# Patient Record
Sex: Female | Born: 1985 | Race: Black or African American | Hispanic: No | Marital: Married | Smoking: Former smoker
Health system: Southern US, Community
[De-identification: ages and names within clinical notes are randomized; demographics above are authoritative.]

## PROBLEM LIST (undated history)

## (undated) ENCOUNTER — Emergency Department (HOSPITAL_BASED_OUTPATIENT_CLINIC_OR_DEPARTMENT_OTHER): Admission: EM | Payer: BLUE CROSS/BLUE SHIELD

## (undated) ENCOUNTER — Emergency Department (HOSPITAL_BASED_OUTPATIENT_CLINIC_OR_DEPARTMENT_OTHER): Admission: EM | Payer: BLUE CROSS/BLUE SHIELD | Source: Home / Self Care

## (undated) DIAGNOSIS — G43909 Migraine, unspecified, not intractable, without status migrainosus: Secondary | ICD-10-CM

## (undated) DIAGNOSIS — K319 Disease of stomach and duodenum, unspecified: Secondary | ICD-10-CM

## (undated) DIAGNOSIS — F419 Anxiety disorder, unspecified: Secondary | ICD-10-CM

## (undated) DIAGNOSIS — IMO0002 Reserved for concepts with insufficient information to code with codable children: Secondary | ICD-10-CM

## (undated) DIAGNOSIS — M329 Systemic lupus erythematosus, unspecified: Secondary | ICD-10-CM

## (undated) DIAGNOSIS — N83209 Unspecified ovarian cyst, unspecified side: Secondary | ICD-10-CM

## (undated) DIAGNOSIS — E079 Disorder of thyroid, unspecified: Secondary | ICD-10-CM

## (undated) HISTORY — PX: TONSILLECTOMY: SUR1361

## (undated) HISTORY — PX: OVARIAN CYST REMOVAL: SHX89

---

## 2012-08-22 DIAGNOSIS — D649 Anemia, unspecified: Secondary | ICD-10-CM | POA: Diagnosis present

## 2012-08-22 DIAGNOSIS — D709 Neutropenia, unspecified: Secondary | ICD-10-CM | POA: Insufficient documentation

## 2012-11-06 DIAGNOSIS — E0591 Thyrotoxicosis, unspecified with thyrotoxic crisis or storm: Secondary | ICD-10-CM | POA: Insufficient documentation

## 2012-11-06 DIAGNOSIS — E0590 Thyrotoxicosis, unspecified without thyrotoxic crisis or storm: Secondary | ICD-10-CM | POA: Insufficient documentation

## 2013-10-13 DIAGNOSIS — D72819 Decreased white blood cell count, unspecified: Secondary | ICD-10-CM | POA: Diagnosis present

## 2013-10-13 DIAGNOSIS — D509 Iron deficiency anemia, unspecified: Secondary | ICD-10-CM | POA: Insufficient documentation

## 2014-08-21 DIAGNOSIS — M329 Systemic lupus erythematosus, unspecified: Secondary | ICD-10-CM | POA: Diagnosis present

## 2015-02-20 DIAGNOSIS — G43009 Migraine without aura, not intractable, without status migrainosus: Secondary | ICD-10-CM | POA: Insufficient documentation

## 2015-08-22 ENCOUNTER — Emergency Department (HOSPITAL_COMMUNITY)
Admission: EM | Admit: 2015-08-22 | Discharge: 2015-08-22 | Disposition: A | Discharge status: Home / Self Care | Payer: Self-pay | Attending: Emergency Medicine | Admitting: Emergency Medicine

## 2015-08-22 ENCOUNTER — Encounter (HOSPITAL_COMMUNITY): Payer: Self-pay

## 2015-08-22 ENCOUNTER — Emergency Department (HOSPITAL_COMMUNITY): Payer: Self-pay

## 2015-08-22 DIAGNOSIS — R0602 Shortness of breath: Secondary | ICD-10-CM | POA: Insufficient documentation

## 2015-08-22 DIAGNOSIS — Z8742 Personal history of other diseases of the female genital tract: Secondary | ICD-10-CM | POA: Insufficient documentation

## 2015-08-22 DIAGNOSIS — F410 Panic disorder [episodic paroxysmal anxiety] without agoraphobia: Secondary | ICD-10-CM | POA: Insufficient documentation

## 2015-08-22 DIAGNOSIS — R05 Cough: Secondary | ICD-10-CM | POA: Insufficient documentation

## 2015-08-22 DIAGNOSIS — R000 Tachycardia, unspecified: Secondary | ICD-10-CM | POA: Insufficient documentation

## 2015-08-22 DIAGNOSIS — Z8679 Personal history of other diseases of the circulatory system: Secondary | ICD-10-CM | POA: Insufficient documentation

## 2015-08-22 DIAGNOSIS — Z8739 Personal history of other diseases of the musculoskeletal system and connective tissue: Secondary | ICD-10-CM | POA: Insufficient documentation

## 2015-08-22 HISTORY — DX: Systemic lupus erythematosus, unspecified: M32.9

## 2015-08-22 HISTORY — DX: Migraine, unspecified, not intractable, without status migrainosus: G43.909

## 2015-08-22 HISTORY — DX: Unspecified ovarian cyst, unspecified side: N83.209

## 2015-08-22 HISTORY — DX: Reserved for concepts with insufficient information to code with codable children: IMO0002

## 2015-08-22 HISTORY — DX: Anxiety disorder, unspecified: F41.9

## 2015-08-22 LAB — D-DIMER, QUANTITATIVE (NOT AT ARMC)

## 2015-08-22 LAB — BASIC METABOLIC PANEL
Anion gap: 11 (ref 5–15)
BUN: 6 mg/dL (ref 6–20)
CALCIUM: 9.6 mg/dL (ref 8.9–10.3)
CO2: 22 mmol/L (ref 22–32)
CREATININE: 0.51 mg/dL (ref 0.44–1.00)
Chloride: 105 mmol/L (ref 101–111)
GFR calc Af Amer: >60 mL/min (ref >60)
GLUCOSE: 121 mg/dL — AB (ref 65–99)
Potassium: 3.1 mmol/L — ABNORMAL LOW (ref 3.5–5.1)
Sodium: 138 mmol/L (ref 135–145)

## 2015-08-22 LAB — CBC WITH DIFFERENTIAL/PLATELET
BASOS ABS: 0 10*3/uL (ref 0.0–0.1)
BASOS PCT: 1 %
EOS ABS: 0.1 10*3/uL (ref 0.0–0.7)
Eosinophils Relative: 2 %
HCT: 32.6 % — ABNORMAL LOW (ref 36.0–46.0)
Hemoglobin: 10.7 g/dL — ABNORMAL LOW (ref 12.0–15.0)
Lymphocytes Relative: 39 %
Lymphs Abs: 0.8 10*3/uL (ref 0.7–4.0)
MCH: 26.8 pg (ref 26.0–34.0)
MCHC: 32.8 g/dL (ref 30.0–36.0)
MCV: 81.7 fL (ref 78.0–100.0)
MONO ABS: 0.2 10*3/uL (ref 0.1–1.0)
MONOS PCT: 11 %
Neutro Abs: 1 10*3/uL — ABNORMAL LOW (ref 1.7–7.7)
Neutrophils Relative %: 47 %
PLATELETS: 195 10*3/uL (ref 150–400)
RBC: 3.99 MIL/uL (ref 3.87–5.11)
RDW: 13.1 % (ref 11.5–15.5)
WBC: 2.2 10*3/uL — ABNORMAL LOW (ref 4.0–10.5)

## 2015-08-22 LAB — I-STAT TROPONIN, ED: Troponin i, poc: 0 ng/mL (ref 0.00–0.08)

## 2015-08-22 LAB — I-STAT BETA HCG BLOOD, ED (MC, WL, AP ONLY): I-stat hCG, quantitative: <5 m[IU]/mL (ref <5)

## 2015-08-22 MED ORDER — LORAZEPAM 1 MG PO TABS: 1.0000 mg | ORAL_TABLET | Freq: Three times a day (TID) | ORAL | Status: DC | PRN

## 2015-08-22 MED ORDER — LORAZEPAM 1 MG PO TABS
1.0000 mg | ORAL_TABLET | Freq: Once | ORAL | Status: AC
Start: 2015-08-22 — End: 2015-08-22
  Administered 2015-08-22: 1 mg via ORAL
  Filled 2015-08-22: qty 1

## 2015-08-22 NOTE — ED Notes (Signed)
Pt reports jitteriness, palpitations and anxiety that began aprox. 1 hour ago.  Pt denies any event that caused the anxiety.  Pt sts "I never had a history until about a year ago and they just pop up.  I did have some caffeine today so I don't know if that had something to do with it."

## 2015-08-22 NOTE — ED Notes (Signed)
Chaplain paged per pt request

## 2015-08-22 NOTE — ED Notes (Signed)
Pt requesting chaplin services at this time.

## 2015-08-22 NOTE — Discharge Instructions (Signed)

## 2015-08-22 NOTE — ED Provider Notes (Signed)
CSN: [id number]     Arrival date & time 08/22/15  1622 History   First MD Initiated Contact with Patient 08/22/15 1633     Chief Complaint  Patient presents with  . Panic Attack     (Consider location/radiation/quality/duration/timing/severity/associated sxs/prior Treatment) Patient is a 29 y.o. female presenting with palpitations.  Palpitations Palpitations quality:  Regular Onset quality:  Sudden Duration:  1 hour Timing:  Constant Progression:  Unchanged Chronicity:  Recurrent Context: anxiety (dx with anxiety by PCP, short course of zoloft, )   Relieved by:  Nothing Worsened by:  Caffeine Ineffective treatments:  None tried Associated symptoms: cough and shortness of breath   Associated symptoms: no leg pain, no near-syncope, no numbness and no vomiting     Past Medical History  Diagnosis Date  . Anxiety   . Lupus (HCC)   . Migraines   . Ovarian cyst    Past Surgical History  Procedure Laterality Date  . Ovarian cyst removal    . Tonsillectomy     History reviewed. No pertinent family history. Social History  Substance Use Topics  . Smoking status: Never Smoker   . Smokeless tobacco: Never Used  . Alcohol Use: No   OB History    No data available     Review of Systems  Respiratory: Positive for cough and shortness of breath.   Cardiovascular: Positive for palpitations. Negative for near-syncope.  Gastrointestinal: Negative for vomiting.  Neurological: Negative for numbness.  All other systems reviewed and are negative.     Allergies  Review of patient's allergies indicates no known allergies.  Home Medications   Prior to Admission medications   Medication Sig Start Date End Date Taking? Authorizing Provider  LORazepam (ATIVAN) 1 MG tablet Take 1 tablet (1 mg total) by mouth 3 (three) times daily as needed for anxiety. 08/22/15   Mirian Mo, MD   BP 114/57 mmHg  Pulse 85  Temp(Src) 98.3 F (36.8 C) (Oral)  Resp 22  SpO2 100%  LMP  08/12/2015 Physical Exam  Constitutional: She is oriented to person, place, and time. She appears well-developed and well-nourished.  HENT:  Head: Normocephalic and atraumatic.  Right Ear: External ear normal.  Left Ear: External ear normal.  Eyes: Conjunctivae and EOM are normal. Pupils are equal, round, and reactive to light.  Neck: Normal range of motion. Neck supple.  Cardiovascular: Regular rhythm, normal heart sounds and intact distal pulses.  Tachycardia present.   Pulmonary/Chest: Effort normal and breath sounds normal.  Abdominal: Soft. Bowel sounds are normal. There is no tenderness.  Musculoskeletal: Normal range of motion.  Neurological: She is alert and oriented to person, place, and time.  Skin: Skin is warm and dry.  Vitals reviewed.   ED Course  Procedures (including critical care time) Labs Review Labs Reviewed  CBC WITH DIFFERENTIAL/PLATELET - Abnormal; Notable for the following:    WBC 2.2 (*)    Hemoglobin 10.7 (*)    HCT 32.6 (*)    Neutro Abs 1.0 (*)    All other components within normal limits  BASIC METABOLIC PANEL - Abnormal; Notable for the following:    Potassium 3.1 (*)    Glucose, Bld 121 (*)    All other components within normal limits  D-DIMER, QUANTITATIVE (NOT AT [facility])  I-STAT BETA HCG BLOOD, ED ([facility], [facility], [facility] ONLY)  I-STAT TROPOININ, ED    Imaging Review Dg Chest 2 View  08/22/2015   CLINICAL DATA:  Cough and congestion with left-sided chest  pain for 1 day  EXAM: CHEST - 2 VIEW  COMPARISON:  None.  FINDINGS: The heart size and mediastinal contours are within normal limits. Both lungs are clear. The visualized skeletal structures are unremarkable.  IMPRESSION: No active disease.   Electronically Signed   By: Alcide Clever M.D.   On: 08/22/2015 17:28   I have personally reviewed and evaluated these images and lab results as part of my medical decision-making.   EKG Interpretation   Date/Time:  Thursday August 22 2015 16:42:19 EDT Ventricular  Rate:  109 PR Interval:  143 QRS Duration: 90 QT Interval:  342 QTC Calculation: 460 R Axis:     Text Interpretation:  Sinus tachycardia No old tracing to compare  Confirmed by Mirian Mo [phone]) on 08/22/2015 4:44:18 PM      MDM   Final diagnoses:  Panic attack    29 y.o. female with pertinent PMH of lupus, anxiety presents with recurrent palpitations and sensation of anxiety.  No precipitant, does not feel actively anxious about anything in life.  Pt does have worsening with symptoms and anxiety during my exam.  Benign exam with exception of tachycardia.    Wu unremarkable.  Likely anxiety.  DC home with small prescription of ativan.    I have reviewed all laboratory and imaging studies if ordered as above  1. Panic attack         Mirian Mo, MD 08/22/15 [phone]

## 2015-09-19 ENCOUNTER — Emergency Department (HOSPITAL_COMMUNITY)
Admission: EM | Admit: 2015-09-19 | Discharge: 2015-09-19 | Discharge status: Against Medical Advice | Payer: Self-pay | Attending: Emergency Medicine | Admitting: Emergency Medicine

## 2015-09-19 DIAGNOSIS — R000 Tachycardia, unspecified: Secondary | ICD-10-CM | POA: Insufficient documentation

## 2015-09-19 LAB — BASIC METABOLIC PANEL
Anion gap: 8 (ref 5–15)
BUN: 5 mg/dL — AB (ref 6–20)
CO2: 22 mmol/L (ref 22–32)
Calcium: 9.3 mg/dL (ref 8.9–10.3)
Chloride: 105 mmol/L (ref 101–111)
Creatinine, Ser: 0.49 mg/dL (ref 0.44–1.00)
Glucose, Bld: 131 mg/dL — ABNORMAL HIGH (ref 65–99)
POTASSIUM: 3.1 mmol/L — AB (ref 3.5–5.1)
SODIUM: 135 mmol/L (ref 135–145)

## 2015-09-19 LAB — CBC
HEMATOCRIT: 33 % — AB (ref 36.0–46.0)
Hemoglobin: 10.6 g/dL — ABNORMAL LOW (ref 12.0–15.0)
MCH: 25.6 pg — ABNORMAL LOW (ref 26.0–34.0)
MCHC: 32.1 g/dL (ref 30.0–36.0)
MCV: 79.7 fL (ref 78.0–100.0)
PLATELETS: 220 10*3/uL (ref 150–400)
RBC: 4.14 MIL/uL (ref 3.87–5.11)
RDW: 12.8 % (ref 11.5–15.5)
WBC: 2.9 10*3/uL — AB (ref 4.0–10.5)

## 2015-09-19 NOTE — ED Notes (Signed)
Pt approached nurse first desk asking how many patients are in front of her and she was informed there are three patients in front of her. Pt states she does not want to stay to wait any longer. Pt observed walking out of ED with steady gait, NAD.

## 2015-09-19 NOTE — ED Notes (Signed)
Patient reports feeling like her heart was beating fast while driving, states she was here for same in October. Patients states she was on her way to see PCP. Denies any CP or SOB.

## 2015-09-19 NOTE — ED Notes (Signed)
Pt approached nurse first stating she did not want to wait any longer. Risks of leaving AMA explained to patient and pt encouraged to stay. Pt states she would wait a little while longer.

## 2015-09-28 ENCOUNTER — Emergency Department (HOSPITAL_COMMUNITY): Payer: Self-pay

## 2015-09-28 ENCOUNTER — Encounter (HOSPITAL_COMMUNITY): Payer: Self-pay | Admitting: *Deleted

## 2015-09-28 ENCOUNTER — Emergency Department (HOSPITAL_COMMUNITY)
Admission: EM | Admit: 2015-09-28 | Discharge: 2015-09-28 | Disposition: A | Discharge status: Home / Self Care | Payer: Self-pay | Attending: Emergency Medicine | Admitting: Emergency Medicine

## 2015-09-28 DIAGNOSIS — X58XXXA Exposure to other specified factors, initial encounter: Secondary | ICD-10-CM | POA: Insufficient documentation

## 2015-09-28 DIAGNOSIS — G43909 Migraine, unspecified, not intractable, without status migrainosus: Secondary | ICD-10-CM | POA: Insufficient documentation

## 2015-09-28 DIAGNOSIS — Z8742 Personal history of other diseases of the female genital tract: Secondary | ICD-10-CM | POA: Insufficient documentation

## 2015-09-28 DIAGNOSIS — Y9389 Activity, other specified: Secondary | ICD-10-CM | POA: Insufficient documentation

## 2015-09-28 DIAGNOSIS — T50905A Adverse effect of unspecified drugs, medicaments and biological substances, initial encounter: Secondary | ICD-10-CM

## 2015-09-28 DIAGNOSIS — T398X5A Adverse effect of other nonopioid analgesics and antipyretics, not elsewhere classified, initial encounter: Secondary | ICD-10-CM | POA: Insufficient documentation

## 2015-09-28 DIAGNOSIS — Y9289 Other specified places as the place of occurrence of the external cause: Secondary | ICD-10-CM | POA: Insufficient documentation

## 2015-09-28 DIAGNOSIS — Y998 Other external cause status: Secondary | ICD-10-CM | POA: Insufficient documentation

## 2015-09-28 DIAGNOSIS — F410 Panic disorder [episodic paroxysmal anxiety] without agoraphobia: Secondary | ICD-10-CM | POA: Insufficient documentation

## 2015-09-28 LAB — BASIC METABOLIC PANEL
Anion gap: 7 (ref 5–15)
BUN: <5 mg/dL — ABNORMAL LOW (ref 6–20)
CHLORIDE: 104 mmol/L (ref 101–111)
CO2: 24 mmol/L (ref 22–32)
CREATININE: 0.53 mg/dL (ref 0.44–1.00)
Calcium: 9.4 mg/dL (ref 8.9–10.3)
GFR calc non Af Amer: >60 mL/min (ref >60)
Glucose, Bld: 111 mg/dL — ABNORMAL HIGH (ref 65–99)
Potassium: 3.9 mmol/L (ref 3.5–5.1)
Sodium: 135 mmol/L (ref 135–145)

## 2015-09-28 LAB — I-STAT CHEM 8, ED
BUN: 4 mg/dL — ABNORMAL LOW (ref 6–20)
CREATININE: 0.5 mg/dL (ref 0.44–1.00)
Calcium, Ion: 1.23 mmol/L (ref 1.12–1.23)
Chloride: 103 mmol/L (ref 101–111)
GLUCOSE: 103 mg/dL — AB (ref 65–99)
HCT: 35 % — ABNORMAL LOW (ref 36.0–46.0)
HEMOGLOBIN: 11.9 g/dL — AB (ref 12.0–15.0)
POTASSIUM: 4.1 mmol/L (ref 3.5–5.1)
Sodium: 140 mmol/L (ref 135–145)
TCO2: 23 mmol/L (ref 0–100)

## 2015-09-28 LAB — I-STAT TROPONIN, ED: TROPONIN I, POC: 0 ng/mL (ref 0.00–0.08)

## 2015-09-28 LAB — CBC
HCT: 32.6 % — ABNORMAL LOW (ref 36.0–46.0)
Hemoglobin: 10.7 g/dL — ABNORMAL LOW (ref 12.0–15.0)
MCH: 26.1 pg (ref 26.0–34.0)
MCHC: 32.8 g/dL (ref 30.0–36.0)
MCV: 79.5 fL (ref 78.0–100.0)
PLATELETS: 219 10*3/uL (ref 150–400)
RBC: 4.1 MIL/uL (ref 3.87–5.11)
RDW: 12.8 % (ref 11.5–15.5)
WBC: 3.8 10*3/uL — ABNORMAL LOW (ref 4.0–10.5)

## 2015-09-28 MED ORDER — GI COCKTAIL ~~LOC~~
30.0000 mL | Freq: Once | ORAL | Status: AC
  Administered 2015-09-28: 30 mL via ORAL
  Filled 2015-09-28: qty 30

## 2015-09-28 NOTE — ED Notes (Signed)
Pt states she feels "heavy" like a tired heavy all over.

## 2015-09-28 NOTE — ED Notes (Signed)
Pt called out reporting sub sternal chest pain. A new EKG was taken, and Palumbo, MD notified.

## 2015-09-28 NOTE — ED Provider Notes (Signed)
CSN: [id number]     Arrival date & time 09/28/15  0206 History  By signing my name below, I, Grace Bishop, attest that this documentation has been prepared under the direction and in the presence of Ravin Bendall, MD. Electronically Signed: Angelene Giovanni, ED Scribe. 09/28/2015. 2:50 AM.    Chief Complaint  Patient presents with  . Chest Pain   Patient is a 29 y.o. female presenting with anxiety. The history is provided by the patient. No language interpreter was used.  Anxiety This is a chronic problem. The current episode started 1 to 2 hours ago. The problem has been gradually improving. Pertinent negatives include no chest pain, no abdominal pain and no shortness of breath. Nothing aggravates the symptoms. Relieved by: Speaking to family members. She has tried nothing for the symptoms.   HPI Comments: Grace Bishop is a 29 y.o. female with a hx of anxiety, lupus, migraines, and an ovarian cyst who presents to the Emergency Department status post a gradually improving panic attack that occurred PTA. She reports associated diaphoresis, tachycardia, and nausea during onset. She states that she took the Sumatriptan with Wellbutrin prior to onset. She denies any fever, chills, abdominal pain, v/d, swelling in legs, pain in legs, numbness, tingling, or sweaty palms. Pt explains that her anxiety has improved after she talked to her family members who have helped her calm down. She reports that she spoke to a Pharmacist who recommended that she takes her Sumatriptan and Wellbutrin together. She denies any recent long car trips or plane trips. She denies any current birth control. She states that she is currently going through counseling and has an appointment with a Psychiatrist to evaluate her for possible medications for treatment.   Past Medical History  Diagnosis Date  . Anxiety   . Lupus (HCC)   . Migraines   . Ovarian cyst    Past Surgical History  Procedure Laterality Date  .  Ovarian cyst removal    . Tonsillectomy     History reviewed. No pertinent family history. Social History  Substance Use Topics  . Smoking status: Never Smoker   . Smokeless tobacco: Never Used  . Alcohol Use: No   OB History    No data available     Review of Systems  Constitutional: Positive for diaphoresis. Negative for fever and chills.  Respiratory: Negative for shortness of breath.   Cardiovascular: Negative for chest pain.  Gastrointestinal: Positive for nausea. Negative for vomiting, abdominal pain and diarrhea.  Musculoskeletal: Negative for joint swelling and arthralgias.  Neurological: Negative for weakness and numbness.  Psychiatric/Behavioral: The patient is nervous/anxious.   All other systems reviewed and are negative.     Allergies  Review of patient's allergies indicates no known allergies.  Home Medications   Prior to Admission medications   Medication Sig Start Date End Date Taking? Authorizing Provider  LORazepam (ATIVAN) 1 MG tablet Take 1 tablet (1 mg total) by mouth 3 (three) times daily as needed for anxiety. 08/22/15   Mirian Mo, MD   BP 123/79 mmHg  Pulse 89  Temp(Src) 99 F (37.2 C) (Oral)  Resp 18  Ht  (1.575 m)  Wt 124 lb (56.246 kg)  BMI 22.67 kg/m2  SpO2 100%  LMP 09/08/2015 Physical Exam  Constitutional: She is oriented to person, place, and time. She appears well-developed and well-nourished. No distress.  HENT:  Head: Normocephalic and atraumatic.  Mouth/Throat: Oropharynx is clear and moist.  Eyes: Conjunctivae and EOM are normal.  Pupils are equal, round, and reactive to light.  Neck: Normal range of motion. Neck supple. No tracheal deviation present.  Cardiovascular: Normal rate and regular rhythm.   Pulmonary/Chest: Effort normal and breath sounds normal. No respiratory distress. She has no wheezes. She has no rales.  Abdominal: Soft. Bowel sounds are normal. She exhibits no mass. There is no tenderness. There is no  rebound and no guarding.  Musculoskeletal: Normal range of motion. She exhibits no edema or tenderness.  No clonus  Neurological: She is alert and oriented to person, place, and time. She has normal reflexes.  Skin: Skin is warm and dry.  Psychiatric: Her mood appears anxious. Her speech is rapid and/or pressured.  Nursing note and vitals reviewed.   ED Course  Procedures (including critical care time) DIAGNOSTIC STUDIES: Oxygen Saturation is 100% on RA, normal by my interpretation.    COORDINATION OF CARE: 2:40 AM- Pt advised of plan for treatment and pt agrees. Pt advised to speak to her PCP about her mixture of medications. Pt informed that Wellbutrin takes approx. 6 weeks to take effect. Pt will provide urine sample and her O2 stats will be monitored.    Labs Review Labs Reviewed  BASIC METABOLIC PANEL  CBC    Imaging Review No results found.  Clarisse Rodriges, MD has personally reviewed and evaluated these lab results as part of her medical decision-making.   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  Then anxious when EDP told her to follow up for her anxiety and lupus. Will need to discuss her medication regimen with her PMD for psychiatry do not take sumitriptan with Effexor.      I personally performed the services described in this documentation, which was scribed in my presence. The recorded information has been reviewed and is accurate.     Cy BlamerApril Priyah Schmuck, MD 09/28/15 [phone]

## 2015-09-28 NOTE — ED Notes (Signed)
Pt started a new anxiety medication effexor 37.5 mg today at 3 pm and states she felt pain in your central chest sharp pain 3/10 that started at 0030 today.

## 2015-09-28 NOTE — ED Notes (Signed)
Pt appears anxious and states hx of panic attacks, this RN helped pt use deep breathing and calming techniques to aid in reduction of stress.

## 2015-09-28 NOTE — Discharge Instructions (Signed)
Panic Attacks °Panic attacks are sudden, short feelings of great fear or discomfort. You may have them for no reason when you are relaxed, when you are uneasy (anxious), or when you are sleeping.  °HOME CARE °· Take all your medicines as told. °· Check with your doctor before starting new medicines. °· Keep all doctor visits. °GET HELP IF: °· You are not able to take your medicines as told. °· Your symptoms do not get better. °· Your symptoms get worse. °GET HELP RIGHT AWAY IF: °· Your attacks seem different than your normal attacks. °· You have thoughts about hurting yourself or others. °· You take panic attack medicine and you have a side effect. °MAKE SURE YOU: °· Understand these instructions. °· Will watch your condition. °· Will get help right away if you are not doing well or get worse. °  °This information is not intended to replace advice given to you by your health care provider. Make sure you discuss any questions you have with your health care provider. °  °Document Released: 12/05/2010 Document Revised: 08/23/2013 Document Reviewed: 06/16/2013 °Elsevier Interactive Patient Education ©2016 Else[employer] Inc. ° °

## 2015-10-07 ENCOUNTER — Emergency Department (HOSPITAL_COMMUNITY)
Admission: EM | Admit: 2015-10-07 | Discharge: 2015-10-07 | Disposition: A | Discharge status: Home / Self Care | Payer: Self-pay | Attending: Emergency Medicine | Admitting: Emergency Medicine

## 2015-10-07 ENCOUNTER — Encounter (HOSPITAL_COMMUNITY): Payer: Self-pay | Admitting: *Deleted

## 2015-10-07 DIAGNOSIS — Z3202 Encounter for pregnancy test, result negative: Secondary | ICD-10-CM | POA: Insufficient documentation

## 2015-10-07 DIAGNOSIS — G43409 Hemiplegic migraine, not intractable, without status migrainosus: Secondary | ICD-10-CM | POA: Insufficient documentation

## 2015-10-07 DIAGNOSIS — R200 Anesthesia of skin: Secondary | ICD-10-CM | POA: Insufficient documentation

## 2015-10-07 DIAGNOSIS — Z79899 Other long term (current) drug therapy: Secondary | ICD-10-CM | POA: Insufficient documentation

## 2015-10-07 DIAGNOSIS — F419 Anxiety disorder, unspecified: Secondary | ICD-10-CM | POA: Insufficient documentation

## 2015-10-07 DIAGNOSIS — Z8739 Personal history of other diseases of the musculoskeletal system and connective tissue: Secondary | ICD-10-CM | POA: Insufficient documentation

## 2015-10-07 DIAGNOSIS — Z8742 Personal history of other diseases of the female genital tract: Secondary | ICD-10-CM | POA: Insufficient documentation

## 2015-10-07 LAB — COMPREHENSIVE METABOLIC PANEL
ALBUMIN: 3.6 g/dL (ref 3.5–5.0)
ALK PHOS: 45 U/L (ref 38–126)
ALT: 28 U/L (ref 14–54)
AST: 25 U/L (ref 15–41)
Anion gap: 8 (ref 5–15)
BILIRUBIN TOTAL: 0.3 mg/dL (ref 0.3–1.2)
BUN: 7 mg/dL (ref 6–20)
CALCIUM: 9.4 mg/dL (ref 8.9–10.3)
CO2: 22 mmol/L (ref 22–32)
Chloride: 107 mmol/L (ref 101–111)
Creatinine, Ser: 0.61 mg/dL (ref 0.44–1.00)
GFR calc Af Amer: >60 mL/min (ref >60)
GLUCOSE: 94 mg/dL (ref 65–99)
POTASSIUM: 3.8 mmol/L (ref 3.5–5.1)
Sodium: 137 mmol/L (ref 135–145)
TOTAL PROTEIN: 8.2 g/dL — AB (ref 6.5–8.1)

## 2015-10-07 LAB — DIFFERENTIAL
BASOS ABS: 0 10*3/uL (ref 0.0–0.1)
Basophils Relative: 0 %
EOS ABS: 0 10*3/uL (ref 0.0–0.7)
Eosinophils Relative: 0 %
LYMPHS ABS: 1.4 10*3/uL (ref 0.7–4.0)
LYMPHS PCT: 38 %
MONOS PCT: 4 %
Monocytes Absolute: 0.2 10*3/uL (ref 0.1–1.0)
NEUTROS PCT: 58 %
Neutro Abs: 2 10*3/uL (ref 1.7–7.7)

## 2015-10-07 LAB — I-STAT BETA HCG BLOOD, ED (MC, WL, AP ONLY): I-stat hCG, quantitative: <5 m[IU]/mL (ref <5)

## 2015-10-07 LAB — APTT: APTT: 30 s (ref 24–37)

## 2015-10-07 LAB — CBC
HEMATOCRIT: 33.2 % — AB (ref 36.0–46.0)
HEMOGLOBIN: 10.8 g/dL — AB (ref 12.0–15.0)
MCH: 26 pg (ref 26.0–34.0)
MCHC: 32.5 g/dL (ref 30.0–36.0)
MCV: 79.8 fL (ref 78.0–100.0)
Platelets: 266 10*3/uL (ref 150–400)
RBC: 4.16 MIL/uL (ref 3.87–5.11)
RDW: 13 % (ref 11.5–15.5)
WBC: 3.6 10*3/uL — ABNORMAL LOW (ref 4.0–10.5)

## 2015-10-07 LAB — I-STAT TROPONIN, ED: TROPONIN I, POC: 0 ng/mL (ref 0.00–0.08)

## 2015-10-07 LAB — PROTIME-INR
INR: 1.02 (ref 0.00–1.49)
Prothrombin Time: 13.6 seconds (ref 11.6–15.2)

## 2015-10-07 MED ORDER — PROCHLORPERAZINE EDISYLATE 5 MG/ML IJ SOLN
5.0000 mg | Freq: Once | INTRAMUSCULAR | Status: AC
  Administered 2015-10-07: 5 mg via INTRAVENOUS
  Filled 2015-10-07: qty 2

## 2015-10-07 MED ORDER — KETOROLAC TROMETHAMINE 30 MG/ML IJ SOLN
30.0000 mg | Freq: Once | INTRAMUSCULAR | Status: AC
  Administered 2015-10-07: 30 mg via INTRAVENOUS
  Filled 2015-10-07: qty 1

## 2015-10-07 MED ORDER — DEXAMETHASONE SODIUM PHOSPHATE 10 MG/ML IJ SOLN
10.0000 mg | Freq: Once | INTRAMUSCULAR | Status: AC
  Administered 2015-10-07: 10 mg via INTRAVENOUS
  Filled 2015-10-07: qty 1

## 2015-10-07 MED ORDER — DIPHENHYDRAMINE HCL 50 MG/ML IJ SOLN
25.0000 mg | Freq: Once | INTRAMUSCULAR | Status: AC
  Administered 2015-10-07: 25 mg via INTRAVENOUS
  Filled 2015-10-07: qty 1

## 2015-10-07 MED ORDER — SODIUM CHLORIDE 0.9 % IV BOLUS (SEPSIS)
1000.0000 mL | Freq: Once | INTRAVENOUS | Status: AC
  Administered 2015-10-07: 1000 mL via INTRAVENOUS

## 2015-10-07 NOTE — Discharge Instructions (Signed)
Recurrent Migraine Headache A migraine headache is an intense, throbbing pain on one or both sides of your head. Recurrent migraines keep coming back. A migraine can last for 30 minutes to several hours. CAUSES  The exact cause of a migraine headache is not always known. However, a migraine may be caused when nerves in the brain become irritated and release chemicals that cause inflammation. This causes pain. Certain things may also trigger migraines, such as:   Alcohol.  Smoking.  Stress.  Menstruation.  Aged cheeses.  Foods or drinks that contain nitrates, glutamate, aspartame, or tyramine.  Lack of sleep.  Chocolate.  Caffeine.  Hunger.  Physical exertion.  Fatigue.  Medicines used to treat chest pain (nitroglycerine), birth control pills, estrogen, and some blood pressure medicines. SYMPTOMS   Pain on one or both sides of your head.  Pulsating or throbbing pain.  Severe pain that prevents daily activities.  Pain that is aggravated by any physical activity.  Nausea, vomiting, or both.  Dizziness.  Pain with exposure to bright lights, loud noises, or activity.  General sensitivity to bright lights, loud noises, or smells. Before you get a migraine, you may get warning signs that a migraine is coming (aura). An aura may include:  Seeing flashing lights.  Seeing bright spots, halos, or zigzag lines.  Having tunnel vision or blurred vision.  Having feelings of numbness or tingling.  Having trouble talking.  Having muscle weakness. DIAGNOSIS  A recurrent migraine headache is often diagnosed based on:  Symptoms.  Physical examination.  A CT scan or MRI of your head. These imaging tests cannot diagnose migraines but can help rule out other causes of headaches.  TREATMENT  Medicines may be given for pain and nausea. Medicines can also be given to help prevent recurrent migraines. HOME CARE INSTRUCTIONS  Only take over-the-counter or prescription  medicines for pain or discomfort as directed by your health care provider. The use of long-term narcotics is not recommended.  Lie down in a dark, quiet room when you have a migraine.  Keep a journal to find out what may trigger your migraine headaches. For example, write down:  What you eat and drink.  How much sleep you get.  Any change to your diet or medicines.  Limit alcohol consumption.  Quit smoking if you smoke.  Get 7-9 hours of sleep, or as recommended by your health care provider.  Limit stress.  Keep lights dim if bright lights bother you and make your migraines worse. SEEK MEDICAL CARE IF:   You do not get relief from the medicines given to you.  You have a recurrence of pain.  You have a fever. SEEK IMMEDIATE MEDICAL CARE IF:  Your migraine becomes severe.  You have a stiff neck.  You have loss of vision.  You have muscular weakness or loss of muscle control.  You start losing your balance or have trouble walking.  You feel faint or pass out.  You have severe symptoms that are different from your first symptoms. MAKE SURE YOU:   Understand these instructions.  Will watch your condition.  Will get help right away if you are not doing well or get worse.   This information is not intended to replace advice given to you by your health care provider. Make sure you discuss any questions you have with your health care provider.   Document Released: 07/28/2001 Document Revised: 11/23/2014 Document Reviewed: 07/10/2013 Elsevier Interactive Patient Education 2016 Elsevier Inc.  

## 2015-10-07 NOTE — ED Notes (Signed)
PA notified of pt deficits, PA to bedside.

## 2015-10-07 NOTE — ED Provider Notes (Signed)
CSN: [id number]     Arrival date & time 10/07/15  1137 History   First MD Initiated Contact with Patient 10/07/15 1255     Chief Complaint  Patient presents with  . Migraine  . Numbness     (Consider location/radiation/quality/duration/timing/severity/associated sxs/prior Treatment) HPI   Grace Bishop Is a 29 year old female with a past medical history of anxiety, lupus, ovarian cyst and complicated migraines. The patient states at 11 AM this morning she had gradual onset of her migraine headache. Patient states that this one is different because her migraines normally come on when she has her period. She states that her migraines have been increasing in frequency. She complains of severe throbbing headache with photo and phonophobia. She also has weakness on the right side of the body, tremors and some difficulty with speech. The patient states that she has had many complicated migraines in the past and that these symptoms are the same as her previous complicated migraines. She states that she went for several years without having complicated migraines but they have returned recently. She has some mild nausea without vomiting. She took imitrex without relief. Denie visual changes, stiff neck, neck pain, rash, or "thunderclap" onset.     Past Medical History  Diagnosis Date  . Anxiety   . Lupus (HCC)   . Migraines   . Ovarian cyst    Past Surgical History  Procedure Laterality Date  . Ovarian cyst removal    . Tonsillectomy     History reviewed. No pertinent family history. Social History  Substance Use Topics  . Smoking status: Never Smoker   . Smokeless tobacco: Never Used  . Alcohol Use: No   OB History    No data available     Review of Systems  Ten systems reviewed and are negative for acute change, except as noted in the HPI.    Allergies  Review of patient's allergies indicates no known allergies.  Home Medications   Prior to Admission medications    Medication Sig Start Date End Date Taking? Authorizing Provider  hydroxychloroquine (PLAQUENIL) 200 MG tablet Take 200 mg by mouth daily.    Historical Provider, MD  ibuprofen (ADVIL,MOTRIN) 800 MG tablet Take 800 mg by mouth every 8 (eight) hours as needed for moderate pain.    Historical Provider, MD  LORazepam (ATIVAN) 1 MG tablet Take 1 tablet (1 mg total) by mouth 3 (three) times daily as needed for anxiety. 08/22/15   Mirian Mo, MD  naproxen (NAPROSYN) 500 MG tablet Take 500 mg by mouth 2 (two) times daily with a meal.    Historical Provider, MD  omeprazole (PRILOSEC) 20 MG capsule Take 20 mg by mouth daily as needed (acid reflux).    Historical Provider, MD  SUMAtriptan (IMITREX) 25 MG tablet Take 25 mg by mouth every 2 (two) hours as needed for migraine. May repeat in 2 hours if headache persists or recurs.    Historical Provider, MD  venlafaxine XR (EFFEXOR-XR) 37.5 MG 24 hr capsule Take 37.5 mg by mouth daily with breakfast.    Historical Provider, MD   BP 119/73 mmHg  Pulse 82  Resp 21  SpO2 100%  LMP 09/08/2015 Physical Exam  Constitutional: She is oriented to person, place, and time. She appears well-developed and well-nourished. No distress.  HENT:  Head: Normocephalic and atraumatic.  Mouth/Throat: Oropharynx is clear and moist.  Eyes: Conjunctivae and EOM are normal. Pupils are equal, round, and reactive to light. No scleral icterus.  No  horizontal, vertical or rotational nystagmus  Neck: Normal range of motion. Neck supple.  Full active and passive ROM without pain No midline or paraspinal tenderness No nuchal rigidity or meningeal signs  Cardiovascular: Normal rate, regular rhythm and intact distal pulses.   Pulmonary/Chest: Effort normal and breath sounds normal. No respiratory distress. She has no wheezes. She has no rales.  Abdominal: Soft. Bowel sounds are normal. There is no tenderness. There is no rebound and no guarding.  Musculoskeletal: Normal range of  motion.  Lymphadenopathy:    She has no cervical adenopathy.  Neurological: She is alert and oriented to person, place, and time. She has normal reflexes. No cranial nerve deficit. She exhibits normal muscle tone. Coordination normal.  Mental Status:  Alert, oriented, thought content appropriate. Speech fluent without evidence of aphasia. Able to follow 2 step commands without difficulty.  Cranial Nerves:  II:  Peripheral visual fields grossly normal, pupils equal, round, reactive to light III,IV, VI: ptosis not present, extra-ocular motions intact bilaterally  V,VII: smile symmetric, facial light touch sensation equal VIII: hearing grossly normal bilaterally  IX,X: midline uvula rise  XI: bilateral shoulder shrug equal and strong XII: midline tongue extension  Motor:  Subtle R upper and R lower extremity weakness.  Sensory: Pinprick and light touch normal in all extremities.  Deep Tendon Reflexes: 2+ and symmetric  Cerebellar: normal finger-to-nose with bilateral upper extremities Gait: normal gait and balance CV: distal pulses palpable throughout   Skin: Skin is warm and dry. No rash noted. She is not diaphoretic.  Psychiatric: She has a normal mood and affect. Her behavior is normal. Judgment and thought content normal.  Nursing note and vitals reviewed.   ED Course  Procedures (including critical care time) Labs Review Labs Reviewed  CBC - Abnormal; Notable for the following:    WBC 3.6 (*)    Hemoglobin 10.8 (*)    HCT 33.2 (*)    All other components within normal limits  COMPREHENSIVE METABOLIC PANEL - Abnormal; Notable for the following:    Total Protein 8.2 (*)    All other components within normal limits  PROTIME-INR  APTT  DIFFERENTIAL  I-STAT TROPOININ, ED  I-STAT BETA HCG BLOOD, ED ([facility], [facility], [facility] ONLY)    Imaging Review No results found. I have personally reviewed and evaluated these images and lab results as part of my medical decision-making.   EKG  Interpretation None      MDM   Final diagnoses:  Hemiplegic migraine without status migrainosus, not intractable    Pt HA treated and improved while in ED. Hemiplegia has resolved. Presentation is like pts typical HA and non [facility], ICH, Meningitis, or temporal arteritis. Pt is afebrile with no focal neuro deficits, nuchal rigidity, or change in vision. Pt is to follow up with PCP to discuss prophylactic medication. Pt verbalizes understanding and is agreeable with plan to dc.      Arthor Captainbigail Iylah Dworkin, PA-C 10/07/15 1507  Lyndal Pulleyaniel Knott, MD 10/08/15 [phone]

## 2015-10-07 NOTE — ED Notes (Addendum)
Pt reports onset approx 11am of migraine symptoms. Also having right side numbness and "tremors." pt has hx of complex migraines with stroke like symptoms. "tremors" noted at triage, both grips are equal, pt is slow to answer questions but is answering them appropriately. Reports light is making headache worse. Reports feeling anxious, hx of anxiety.

## 2016-01-20 DIAGNOSIS — F411 Generalized anxiety disorder: Secondary | ICD-10-CM | POA: Diagnosis present

## 2016-11-13 ENCOUNTER — Encounter (HOSPITAL_BASED_OUTPATIENT_CLINIC_OR_DEPARTMENT_OTHER): Payer: Self-pay | Admitting: Emergency Medicine

## 2016-11-13 ENCOUNTER — Emergency Department (HOSPITAL_BASED_OUTPATIENT_CLINIC_OR_DEPARTMENT_OTHER)
Admission: EM | Admit: 2016-11-13 | Discharge: 2016-11-13 | Disposition: A | Discharge status: Home / Self Care | Payer: BLUE CROSS/BLUE SHIELD | Attending: Emergency Medicine | Admitting: Emergency Medicine

## 2016-11-13 DIAGNOSIS — Z79899 Other long term (current) drug therapy: Secondary | ICD-10-CM | POA: Insufficient documentation

## 2016-11-13 DIAGNOSIS — G43809 Other migraine, not intractable, without status migrainosus: Secondary | ICD-10-CM | POA: Insufficient documentation

## 2016-11-13 DIAGNOSIS — G43909 Migraine, unspecified, not intractable, without status migrainosus: Secondary | ICD-10-CM | POA: Diagnosis present

## 2016-11-13 LAB — PREGNANCY, URINE: PREG TEST UR: NEGATIVE

## 2016-11-13 MED ORDER — SODIUM CHLORIDE 0.9 % IV BOLUS (SEPSIS)
1000.0000 mL | Freq: Once | INTRAVENOUS | Status: AC
  Administered 2016-11-13: 1000 mL via INTRAVENOUS

## 2016-11-13 MED ORDER — PROCHLORPERAZINE EDISYLATE 5 MG/ML IJ SOLN
10.0000 mg | Freq: Once | INTRAMUSCULAR | Status: AC
  Administered 2016-11-13: 10 mg via INTRAVENOUS
  Filled 2016-11-13: qty 2

## 2016-11-13 MED ORDER — DIPHENHYDRAMINE HCL 50 MG/ML IJ SOLN
25.0000 mg | Freq: Once | INTRAMUSCULAR | Status: AC
  Administered 2016-11-13: 25 mg via INTRAVENOUS
  Filled 2016-11-13: qty 1

## 2016-11-13 MED ORDER — KETOROLAC TROMETHAMINE 30 MG/ML IJ SOLN
30.0000 mg | Freq: Once | INTRAMUSCULAR | Status: AC
  Administered 2016-11-13: 30 mg via INTRAVENOUS
  Filled 2016-11-13: qty 1

## 2016-11-13 NOTE — ED Provider Notes (Signed)
[facility] Provider Note   CSN: [id number] Arrival date & time: 11/13/16  1747  By signing my name below, I, Linna DarnerRussell Turner, attest that this documentation has been prepared under the direction and in the presence of physician practitioner, Laurence Spatesachel Morgan Jayshawn Colston, MD. Electronically Signed: Linna Darnerussell Turner, Scribe. 11/13/2016. 6:59 PM.  History   Chief Complaint Chief Complaint  Patient presents with  . Migraine    The history is provided by the patient. No language interpreter was used.     HPI Comments: Grace Bishop is a 30 y.o. female with PMHx significant for migraines who presents to the Emergency Department complaining of gradual onset, constant, waxing and waning, worsening, migraine beginning 2 days ago. She has a h/o migraines and states her current one feels like her typical migraines. She reports her migraine improved yesterday but worsened today. She notes associated photophobia, phonophobia, and intermittent nausea. Pt notes she has had some occasional diarrhea recently and states she has felt very fatigued today. Pt has used Baclofen and Promethazine for her migraine with no significant improvement; she notes these medications usually improve her migraines. She states she does not use Tylenol or ibuprofen often for migraines and has not taken either of these medications today. No recent medication changes. NKDA. She denies extremity weakness, numbness/tingling, vision changes, fever, chills, cough, congestion, rhinorrhea, vomiting, or any other associated symptoms.  Neurologist: Dr. Reola CalkinsBeck with [facility] in [place]  Past Medical History:  Diagnosis Date  . Anxiety   . Lupus   . Migraines   . Ovarian cyst     There are no active problems to display for this patient.   Past Surgical History:  Procedure Laterality Date  . OVARIAN CYST REMOVAL    . TONSILLECTOMY      OB History    No data available       Home Medications    Prior to  Admission medications   Medication Sig Start Date End Date Taking? Authorizing Provider  baclofen (LIORESAL) 10 MG tablet Take 10 mg by mouth 3 (three) times daily.   Yes Historical Provider, MD  promethazine (PHENERGAN) 25 MG tablet Take 25 mg by mouth every 6 (six) hours as needed for nausea or vomiting.   Yes Historical Provider, MD  hydroxychloroquine (PLAQUENIL) 200 MG tablet Take 200 mg by mouth daily.    Historical Provider, MD  ibuprofen (ADVIL,MOTRIN) 800 MG tablet Take 800 mg by mouth every 8 (eight) hours as needed for moderate pain.    Historical Provider, MD  naproxen (NAPROSYN) 500 MG tablet Take 500 mg by mouth 2 (two) times daily with a meal.    Historical Provider, MD  omeprazole (PRILOSEC) 20 MG capsule Take 20 mg by mouth daily as needed (acid reflux).    Historical Provider, MD  SUMAtriptan (IMITREX) 25 MG tablet Take 25 mg by mouth every 2 (two) hours as needed for migraine. May repeat in 2 hours if headache persists or recurs.    Historical Provider, MD  venlafaxine XR (EFFEXOR-XR) 37.5 MG 24 hr capsule Take 37.5 mg by mouth daily with breakfast.    Historical Provider, MD    Family History No family history on file.  Social History Social History  Substance Use Topics  . Smoking status: Never Smoker  . Smokeless tobacco: Never Used  . Alcohol use No     Allergies   Patient has no known allergies.   Review of Systems Review of Systems  10 Systems reviewed and all are  negative for acute change except as noted in the HPI.   Physical Exam Updated Vital Signs BP 104/73 (BP Location: Right Arm)   Pulse 82   Temp 98.2 F (36.8 C)   Resp 16   Ht 5\' 2"  (1.575 m)   Wt 124 lb (56.2 kg)   LMP 10/25/2016   SpO2 99%   BMI 22.68 kg/m   Physical Exam  Constitutional: She is oriented to person, place, and time. She appears well-developed and well-nourished. No distress.  Awake, alert  HENT:  Head: Normocephalic and atraumatic.  Eyes: Conjunctivae and EOM are  normal. Pupils are equal, round, and reactive to light.  Neck: Neck supple.  Cardiovascular: Normal rate, regular rhythm and normal heart sounds.   No murmur heard. Pulmonary/Chest: Effort normal and breath sounds normal. No respiratory distress.  Abdominal: Soft. Bowel sounds are normal. She exhibits no distension. There is no tenderness.  Musculoskeletal: She exhibits no edema.  Neurological: She is alert and oriented to person, place, and time. She has normal reflexes. No cranial nerve deficit. She exhibits normal muscle tone.  Fluent speech, normal finger-to-nose testing, negative pronator drift, no clonus 5/5 strength and normal sensation x all 4 extremities  Skin: Skin is warm and dry.  Psychiatric: She has a normal mood and affect. Judgment and thought content normal.  Nursing note and vitals reviewed.    ED Treatments / Results  Labs (all labs ordered are listed, but only abnormal results are displayed) Labs Reviewed  PREGNANCY, URINE    EKG  EKG Interpretation None       Radiology No results found.  Procedures Procedures (including critical care time)  DIAGNOSTIC STUDIES: Oxygen Saturation is 100% on RA, normal by my interpretation.    COORDINATION OF CARE: 7:07 PM Discussed treatment plan with pt at bedside and pt agreed to plan.  Medications Ordered in ED Medications  diphenhydrAMINE (BENADRYL) injection 25 mg (25 mg Intravenous Given 11/13/16 1939)  prochlorperazine (COMPAZINE) injection 10 mg (10 mg Intravenous Given 11/13/16 1939)  sodium chloride 0.9 % bolus 1,000 mL (1,000 mLs Intravenous New Bag/Given 11/13/16 1940)  ketorolac (TORADOL) 30 MG/ML injection 30 mg (30 mg Intravenous Given 11/13/16 1939)     Initial Impression / Assessment and Plan / ED Course  I have reviewed the triage vital signs and the nursing notes.  Pertinent labs that were available during my care of the patient were reviewed by me and considered in my medical decision making  (see chart for details).  Clinical Course    Pt With history of migraines presents with typical migraine that was not relieved by her usual home medications. She was well-appearing on exam with normal vital signs. Normal neurologic exam. Gave migraine cocktail of Benadryl, Compazine, IV fluid bolus, and Toradol. On reexamination, she was well-appearing and reported significant improvement in her pain. She was tolerating water with no difficulty. The patient denies any neurologic symptoms such as visual changes, focal numbness/weakness, balance problems, confusion, or speech difficulty to suggest a life-threatening intracranial process such as intracranial hemorrhage or mass. The patient has no clotting risk factors thus venous sinus thrombosis is unlikely.  I feel that the patient is safe for discharge home without any head imaging at this time. I have reviewed return precautions including development of neurologic symptoms, confusion, lethargy, or difficulty speaking and patient has voiced understanding. Instructed to contact her neurologist for follow-up appointment. Patient discharged in satisfactory condition.   Final Clinical Impressions(s) / ED Diagnoses   Final  diagnoses:  Other migraine without status migrainosus, not intractable    New Prescriptions New Prescriptions   No medications on file  I personally performed the services described in this documentation, which was scribed in my presence. The recorded information has been reviewed and is accurate.    Laurence Spatesachel Morgan Vail Basista, MD 11/13/16 [phone]

## 2016-11-13 NOTE — ED Triage Notes (Signed)
Pt has migraine since this am.  Took usual meds without relief.  Pt states the headache is similar to past migraines.  Some light and sound sensitivity.  Some nausea.

## 2016-11-21 ENCOUNTER — Encounter (HOSPITAL_BASED_OUTPATIENT_CLINIC_OR_DEPARTMENT_OTHER): Payer: Self-pay | Admitting: Emergency Medicine

## 2016-11-21 DIAGNOSIS — Z79899 Other long term (current) drug therapy: Secondary | ICD-10-CM | POA: Diagnosis not present

## 2016-11-21 DIAGNOSIS — G43909 Migraine, unspecified, not intractable, without status migrainosus: Secondary | ICD-10-CM | POA: Diagnosis present

## 2016-11-21 DIAGNOSIS — G43409 Hemiplegic migraine, not intractable, without status migrainosus: Secondary | ICD-10-CM | POA: Diagnosis not present

## 2016-11-21 NOTE — ED Triage Notes (Signed)
Pt reports migraine headache since yesterday, similar to previous migraines she has had.  Her at home migraine medications have noe resolved symptoms.

## 2016-11-22 ENCOUNTER — Emergency Department (HOSPITAL_BASED_OUTPATIENT_CLINIC_OR_DEPARTMENT_OTHER)
Admission: EM | Admit: 2016-11-22 | Discharge: 2016-11-22 | Disposition: A | Discharge status: Home / Self Care | Payer: BLUE CROSS/BLUE SHIELD | Attending: Emergency Medicine | Admitting: Emergency Medicine

## 2016-11-22 DIAGNOSIS — G43409 Hemiplegic migraine, not intractable, without status migrainosus: Secondary | ICD-10-CM

## 2016-11-22 MED ORDER — KETOROLAC TROMETHAMINE 15 MG/ML IJ SOLN
15.0000 mg | Freq: Once | INTRAMUSCULAR | Status: AC
  Administered 2016-11-22: 15 mg via INTRAVENOUS
  Filled 2016-11-22: qty 1

## 2016-11-22 MED ORDER — SODIUM CHLORIDE 0.9 % IV BOLUS (SEPSIS)
1000.0000 mL | Freq: Once | INTRAVENOUS | Status: AC
  Administered 2016-11-22: 1000 mL via INTRAVENOUS

## 2016-11-22 MED ORDER — PROCHLORPERAZINE EDISYLATE 5 MG/ML IJ SOLN
10.0000 mg | Freq: Once | INTRAMUSCULAR | Status: AC
  Administered 2016-11-22: 10 mg via INTRAVENOUS
  Filled 2016-11-22: qty 2

## 2016-11-22 MED ORDER — DIPHENHYDRAMINE HCL 50 MG/ML IJ SOLN
25.0000 mg | Freq: Once | INTRAMUSCULAR | Status: AC
  Administered 2016-11-22: 25 mg via INTRAVENOUS
  Filled 2016-11-22: qty 1

## 2016-11-22 NOTE — ED Notes (Signed)
ED Provider at bedside. 

## 2016-11-22 NOTE — ED Notes (Signed)
Pt given d/c instructions as per chart. Verbalizes understanding. No questions. 

## 2016-11-22 NOTE — ED Provider Notes (Signed)
[facility] Provider Note: Lowella Dell, MD, FACEP  CSN: [id number] MRN: [id number] ARRIVAL: 11/21/16 at 2207 ROOM: MH02/[facility]02   CHIEF COMPLAINT  Migraine   HISTORY OF PRESENT ILLNESS  Grace Bishop is a 31 y.o. female with a history of migraines. She is here with a typical migraine that began yesterday morning. It has been persistent. It is located frontally and feels like pressure. Pain is moderate to severe. She has taken baclofen and Phenergan without relief. Pain is worse with exposure to light. She has had nausea and vomiting.   Past Medical History:  Diagnosis Date  . Anxiety   . Lupus   . Migraines   . Ovarian cyst     Past Surgical History:  Procedure Laterality Date  . OVARIAN CYST REMOVAL    . TONSILLECTOMY      No family history on file.  Social History  Substance Use Topics  . Smoking status: Never Smoker  . Smokeless tobacco: Never Used  . Alcohol use No    Prior to Admission medications   Medication Sig Start Date End Date Taking? Authorizing Provider  baclofen (LIORESAL) 10 MG tablet Take 10 mg by mouth 3 (three) times daily.   Yes Historical Provider, MD  hydroxychloroquine (PLAQUENIL) 200 MG tablet Take 200 mg by mouth daily.   Yes Historical Provider, MD  promethazine (PHENERGAN) 25 MG tablet Take 25 mg by mouth every 6 (six) hours as needed for nausea or vomiting.   Yes Historical Provider, MD  ibuprofen (ADVIL,MOTRIN) 800 MG tablet Take 800 mg by mouth every 8 (eight) hours as needed for moderate pain.    Historical Provider, MD  naproxen (NAPROSYN) 500 MG tablet Take 500 mg by mouth 2 (two) times daily with a meal.    Historical Provider, MD  omeprazole (PRILOSEC) 20 MG capsule Take 20 mg by mouth daily as needed (acid reflux).    Historical Provider, MD  SUMAtriptan (IMITREX) 25 MG tablet Take 25 mg by mouth every 2 (two) hours as needed for migraine. May repeat in 2 hours if headache persists or recurs.    Historical Provider, MD    venlafaxine XR (EFFEXOR-XR) 37.5 MG 24 hr capsule Take 37.5 mg by mouth daily with breakfast.    Historical Provider, MD    Allergies Patient has no known allergies.   REVIEW OF SYSTEMS  Negative except as noted here or in the History of Present Illness.   PHYSICAL EXAMINATION  Initial Vital Signs Blood pressure 104/64, pulse 85, temperature 98.1 F (36.7 C), temperature source Oral, resp. rate 16, last menstrual period 11/19/2016, SpO2 99 %.  Examination General: Well-developed, well-nourished female in no acute distress; appearance consistent with age of record HENT: normocephalic; atraumatic Eyes: pupils equal, round and reactive to light; extraocular muscles intact; photophobia Neck: supple Heart: regular rate and rhythm Lungs: clear to auscultation bilaterally Abdomen: soft; nondistended; nontender; no masses or hepatosplenomegaly; bowel sounds present Extremities: No deformity; full range of motion; pulses normal Neurologic: Awake, alert and oriented; motor function intact in all extremities and symmetric; no facial droop Skin: Warm and dry Psychiatric: Flat affect   RESULTS  Summary of this visit's results, reviewed by myself:   EKG Interpretation  Date/Time:    Ventricular Rate:    PR Interval:    QRS Duration:   QT Interval:    QTC Calculation:   R Axis:     Text Interpretation:        Laboratory Studies: No results found for  this or any previous visit (from the past 24 hour(s)). Imaging Studies: No results found.  ED COURSE  Nursing notes and initial vitals signs, including pulse oximetry, reviewed.  Vitals:   11/22/16 0103 11/22/16 0230  BP: 104/64 113/74  Pulse: 85 84  Resp: 16 18  Temp: 98.1 F (36.7 C) 98.2 F (36.8 C)  TempSrc: Oral Oral  SpO2: 99% 99%   3:20 AM Patient symptoms significantly improved after IV fluids and medications.  PROCEDURES    ED DIAGNOSES     ICD-9-CM ICD-10-CM   1. Sporadic migraine 346.30 G43.409         Paula LibraJohn Onisha Cedeno, MD 11/22/16 [phone]

## 2016-11-23 DIAGNOSIS — R002 Palpitations: Secondary | ICD-10-CM | POA: Insufficient documentation

## 2016-12-06 ENCOUNTER — Emergency Department (HOSPITAL_BASED_OUTPATIENT_CLINIC_OR_DEPARTMENT_OTHER)
Admission: EM | Admit: 2016-12-06 | Discharge: 2016-12-06 | Disposition: A | Discharge status: Home / Self Care | Payer: BLUE CROSS/BLUE SHIELD | Attending: Emergency Medicine | Admitting: Emergency Medicine

## 2016-12-06 ENCOUNTER — Encounter (HOSPITAL_BASED_OUTPATIENT_CLINIC_OR_DEPARTMENT_OTHER): Payer: Self-pay | Admitting: *Deleted

## 2016-12-06 ENCOUNTER — Emergency Department (HOSPITAL_BASED_OUTPATIENT_CLINIC_OR_DEPARTMENT_OTHER): Payer: BLUE CROSS/BLUE SHIELD

## 2016-12-06 DIAGNOSIS — J181 Lobar pneumonia, unspecified organism: Secondary | ICD-10-CM | POA: Diagnosis not present

## 2016-12-06 DIAGNOSIS — R51 Headache: Secondary | ICD-10-CM

## 2016-12-06 DIAGNOSIS — J189 Pneumonia, unspecified organism: Secondary | ICD-10-CM

## 2016-12-06 DIAGNOSIS — R519 Headache, unspecified: Secondary | ICD-10-CM

## 2016-12-06 DIAGNOSIS — R109 Unspecified abdominal pain: Secondary | ICD-10-CM | POA: Diagnosis present

## 2016-12-06 LAB — CBC WITH DIFFERENTIAL/PLATELET
BASOS ABS: 0 10*3/uL (ref 0.0–0.1)
BASOS PCT: 0 %
EOS ABS: 0 10*3/uL (ref 0.0–0.7)
EOS PCT: 1 %
HCT: 35.4 % — ABNORMAL LOW (ref 36.0–46.0)
Hemoglobin: 11.7 g/dL — ABNORMAL LOW (ref 12.0–15.0)
LYMPHS ABS: 0.9 10*3/uL (ref 0.7–4.0)
Lymphocytes Relative: 24 %
MCH: 27.1 pg (ref 26.0–34.0)
MCHC: 33.1 g/dL (ref 30.0–36.0)
MCV: 82.1 fL (ref 78.0–100.0)
Monocytes Absolute: 0.4 10*3/uL (ref 0.1–1.0)
Monocytes Relative: 12 %
Neutro Abs: 2.3 10*3/uL (ref 1.7–7.7)
Neutrophils Relative %: 63 %
PLATELETS: 252 10*3/uL (ref 150–400)
RBC: 4.31 MIL/uL (ref 3.87–5.11)
RDW: 12.3 % (ref 11.5–15.5)
WBC: 3.6 10*3/uL — AB (ref 4.0–10.5)

## 2016-12-06 LAB — COMPREHENSIVE METABOLIC PANEL
ALK PHOS: 42 U/L (ref 38–126)
ALT: 22 U/L (ref 14–54)
AST: 16 U/L (ref 15–41)
Albumin: 3.9 g/dL (ref 3.5–5.0)
Anion gap: 6 (ref 5–15)
BILIRUBIN TOTAL: 0.6 mg/dL (ref 0.3–1.2)
BUN: 9 mg/dL (ref 6–20)
CALCIUM: 9.4 mg/dL (ref 8.9–10.3)
CO2: 25 mmol/L (ref 22–32)
CREATININE: 0.48 mg/dL (ref 0.44–1.00)
Chloride: 105 mmol/L (ref 101–111)
Glucose, Bld: 93 mg/dL (ref 65–99)
Potassium: 3.6 mmol/L (ref 3.5–5.1)
Sodium: 136 mmol/L (ref 135–145)
TOTAL PROTEIN: 8.3 g/dL — AB (ref 6.5–8.1)

## 2016-12-06 LAB — URINALYSIS, ROUTINE W REFLEX MICROSCOPIC
BILIRUBIN URINE: NEGATIVE
GLUCOSE, UA: NEGATIVE mg/dL
HGB URINE DIPSTICK: NEGATIVE
Ketones, ur: NEGATIVE mg/dL
Leukocytes, UA: NEGATIVE
Nitrite: NEGATIVE
Protein, ur: NEGATIVE mg/dL
Specific Gravity, Urine: 1.014 (ref 1.005–1.030)
pH: 8 (ref 5.0–8.0)

## 2016-12-06 LAB — INFLUENZA PANEL BY PCR (TYPE A & B)
INFLAPCR: NEGATIVE
INFLBPCR: NEGATIVE

## 2016-12-06 LAB — PREGNANCY, URINE: Preg Test, Ur: NEGATIVE

## 2016-12-06 MED ORDER — SODIUM CHLORIDE 0.9 % IV BOLUS (SEPSIS)
1000.0000 mL | Freq: Once | INTRAVENOUS | Status: AC
  Administered 2016-12-06: 1000 mL via INTRAVENOUS

## 2016-12-06 MED ORDER — DIPHENHYDRAMINE HCL 50 MG/ML IJ SOLN
25.0000 mg | Freq: Once | INTRAMUSCULAR | Status: AC
  Administered 2016-12-06: 25 mg via INTRAVENOUS
  Filled 2016-12-06: qty 1

## 2016-12-06 MED ORDER — AMOXICILLIN-POT CLAVULANATE ER 1000-62.5 MG PO TB12: 2.0000 | ORAL_TABLET | Freq: Two times a day (BID) | ORAL | 0 refills | Status: DC

## 2016-12-06 MED ORDER — OSELTAMIVIR PHOSPHATE 75 MG PO CAPS
75.0000 mg | ORAL_CAPSULE | Freq: Once | ORAL | Status: AC
  Administered 2016-12-06: 75 mg via ORAL
  Filled 2016-12-06: qty 1

## 2016-12-06 MED ORDER — AZITHROMYCIN 250 MG PO TABS: 250.0000 mg | ORAL_TABLET | Freq: Every day | ORAL | 0 refills | Status: DC

## 2016-12-06 MED ORDER — KETOROLAC TROMETHAMINE 15 MG/ML IJ SOLN
15.0000 mg | Freq: Once | INTRAMUSCULAR | Status: AC
  Administered 2016-12-06: 15 mg via INTRAVENOUS
  Filled 2016-12-06: qty 1

## 2016-12-06 MED ORDER — AZITHROMYCIN 250 MG PO TABS
500.0000 mg | ORAL_TABLET | Freq: Once | ORAL | Status: AC
  Administered 2016-12-06: 500 mg via ORAL
  Filled 2016-12-06: qty 2

## 2016-12-06 MED ORDER — METOCLOPRAMIDE HCL 5 MG/ML IJ SOLN
10.0000 mg | Freq: Once | INTRAMUSCULAR | Status: AC
  Administered 2016-12-06: 10 mg via INTRAVENOUS
  Filled 2016-12-06: qty 2

## 2016-12-06 MED ORDER — OSELTAMIVIR PHOSPHATE 75 MG PO CAPS: 75.0000 mg | ORAL_CAPSULE | Freq: Two times a day (BID) | ORAL | 0 refills | Status: DC

## 2016-12-06 MED ORDER — DEXTROSE 5 % IV SOLN
1.0000 g | Freq: Once | INTRAVENOUS | Status: AC
  Administered 2016-12-06: 1 g via INTRAVENOUS
  Filled 2016-12-06: qty 10

## 2016-12-06 NOTE — ED Triage Notes (Signed)
Pt c/o abd pain with nausea and h/a x 1 day

## 2016-12-06 NOTE — ED Provider Notes (Signed)
[facility]   CSN: [id number] Arrival date & time: 12/06/16  0749     History   Chief Complaint Chief Complaint  Patient presents with  . Abdominal Pain    HPI Petrice Beedy is a 31 y.o. female.  The history is provided by the patient. No language interpreter was used.  Abdominal Pain     Lazaria Schaben is a 31 y.o. female who presents to the Emergency Department complaining of migraine, body aches.  She has a history of migraine headaches and lupus, takes plaquenil. She states over last 4 days she's had a throbbing frontal headache that is waxing and waning in nature. At 1 AM she developed associated sore throat, body aches, myalgias, cough. No reports of fevers. She has associated photophobia. No numbness, weakness, vision changes, vomiting, diarrhea. She does have nausea. 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 Headache is similar to the headaches she's been having over the last few months. Past Medical History:  Diagnosis Date  . Anxiety   . Lupus   . Migraines   . Ovarian cyst     There are no active problems to display for this patient.   Past Surgical History:  Procedure Laterality Date  . OVARIAN CYST REMOVAL    . TONSILLECTOMY      OB History    No data available       Home Medications    Prior to Admission medications   Medication Sig Start Date End Date Taking? Authorizing Provider  amoxicillin-clavulanate (AUGMENTIN XR) 1000-62.5 MG 12 hr tablet Take 2 tablets by mouth 2 (two) times daily. 12/06/16   Tilden Fossa, MD  azithromycin (ZITHROMAX) 250 MG tablet Take 1 tablet (250 mg total) by mouth daily. 12/07/16   Tilden Fossa, MD  baclofen (LIORESAL) 10 MG tablet Take 10 mg by mouth 3 (three) times daily.    Historical Provider, MD  hydroxychloroquine (PLAQUENIL) 200 MG tablet Take 200 mg by mouth daily.    Historical Provider, MD  naproxen (NAPROSYN) 500 MG tablet Take 500 mg by mouth 2  (two) times daily with a meal.    Historical Provider, MD  oseltamivir (TAMIFLU) 75 MG capsule Take 1 capsule (75 mg total) by mouth every 12 (twelve) hours. 12/06/16   Tilden Fossa, MD  promethazine (PHENERGAN) 25 MG tablet Take 25 mg by mouth every 6 (six) hours as needed for nausea or vomiting.    Historical Provider, MD    Family History History reviewed. No pertinent family history.  Social History Social History  Substance Use Topics  . Smoking status: Never Smoker  . Smokeless tobacco: Never Used  . Alcohol use No     Allergies   Patient has no known allergies.   Review of Systems Review of Systems  Gastrointestinal: Positive for abdominal pain.  All other systems reviewed and are negative.    Physical Exam Updated Vital Signs BP 96/59 (BP Location: Left Arm)   Pulse 73   Temp 98.4 F (36.9 C) (Oral)   Resp 16   Ht 5\' 2"  (1.575 m)   Wt 124 lb (56.2 kg)   LMP 11/19/2016 (Exact Date)   SpO2 98%   BMI 22.68 kg/m   Physical Exam  Constitutional: She is oriented to person, place, and time. She appears well-developed and well-nourished.  HENT:  Head: Normocephalic and atraumatic.  Right Ear: External ear normal.  Left Ear: External ear normal.  Mouth/Throat: Oropharynx is clear  and moist.  Eyes: EOM are normal. Pupils are equal, round, and reactive to light.  Mild photophobia  Neck: Neck supple.  Cardiovascular: Normal rate and regular rhythm.   No murmur heard. Pulmonary/Chest: Effort normal and breath sounds normal. No respiratory distress.  Abdominal: Soft. There is no tenderness. There is no rebound and no guarding.  Musculoskeletal: She exhibits no edema or tenderness.  Neurological: She is alert and oriented to person, place, and time.  Skin: Skin is warm and dry.  Psychiatric: She has a normal mood and affect. Her behavior is normal.  Nursing note and vitals reviewed.    ED Treatments / Results  Labs (all labs ordered are listed, but only  abnormal results are displayed) Labs Reviewed  COMPREHENSIVE METABOLIC PANEL - Abnormal; Notable for the following:       Result Value   Total Protein 8.3 (*)    All other components within normal limits  CBC WITH DIFFERENTIAL/PLATELET - Abnormal; Notable for the following:    WBC 3.6 (*)    Hemoglobin 11.7 (*)    HCT 35.4 (*)    All other components within normal limits  URINALYSIS, ROUTINE W REFLEX MICROSCOPIC  PREGNANCY, URINE  INFLUENZA PANEL BY PCR (TYPE A & B)    EKG  EKG Interpretation None       Radiology Dg Chest 2 View  Result Date: 12/06/2016 CLINICAL DATA:  Cough, 1 day duration.  Bilateral arm and leg pain. EXAM: CHEST  2 VIEW COMPARISON:  None. FINDINGS: Heart size is normal. Mediastinal shadows are normal. The left lung is clear. There is right middle lobe pneumonia. No effusions. No bony abnormalities. IMPRESSION: Right middle lobe pneumonia Electronically Signed   By: Paulina Fusi M.D.   On: 12/06/2016 09:56    Procedures Procedures (including critical care time)  Medications Ordered in ED Medications  sodium chloride 0.9 % bolus 1,000 mL (0 mLs Intravenous Stopped 12/06/16 1207)  metoCLOPramide (REGLAN) injection 10 mg (10 mg Intravenous Given 12/06/16 0945)  diphenhydrAMINE (BENADRYL) injection 25 mg (25 mg Intravenous Given 12/06/16 0945)  oseltamivir (TAMIFLU) capsule 75 mg (75 mg Oral Given 12/06/16 0944)  cefTRIAXone (ROCEPHIN) 1 g in dextrose 5 % 50 mL IVPB (0 g Intravenous Stopped 12/06/16 1208)  azithromycin (ZITHROMAX) tablet 500 mg (500 mg Oral Given 12/06/16 1032)  ketorolac (TORADOL) 15 MG/ML injection 15 mg (15 mg Intravenous Given 12/06/16 1034)     Initial Impression / Assessment and Plan / ED Course  I have reviewed the triage vital signs and the nursing notes.  Pertinent labs & imaging results that were available during my care of the patient were reviewed by me and considered in my medical decision making (see chart for details).   Patient  with history of migraine headaches and lupus here with headaches, body aches, cough. Chest x-ray is concerning for pneumonia. Given her exposure to multiple sick people at work will treat with Tamiflu for possible influenza. She is nontoxic on examination and current clinical picture is not consistent with sepsis, meningitis, subarachnoid hemorrhage. Following treatment in the emergency department she is feeling improved. Labs are stable compared to priors. Plan to DC home with treatment for influenza as well as pneumonia. Home care, close outpatient follow-up and return precautions were discussed.  Final Clinical Impressions(s) / ED Diagnoses   Final diagnoses:  Community acquired pneumonia of right middle lobe of lung (HCC)  Bad headache    New Prescriptions Discharge Medication List as of 12/06/2016 11:33 AM  START taking these medications   Details  amoxicillin-clavulanate (AUGMENTIN XR) 1000-62.5 MG 12 hr tablet Take 2 tablets by mouth 2 (two) times daily., Starting Sun 12/06/2016, Print    azithromycin (ZITHROMAX) 250 MG tablet Take 1 tablet (250 mg total) by mouth daily., Starting Mon 12/07/2016, Print    oseltamivir (TAMIFLU) 75 MG capsule Take 1 capsule (75 mg total) by mouth every 12 (twelve) hours., Starting Sun 12/06/2016, Print         Tilden FossaElizabeth Henri Baumler, MD 12/07/16 1131

## 2016-12-07 ENCOUNTER — Telehealth (HOSPITAL_BASED_OUTPATIENT_CLINIC_OR_DEPARTMENT_OTHER): Payer: Self-pay | Admitting: *Deleted

## 2016-12-07 NOTE — Telephone Encounter (Signed)
Pt called regarding visit yesterday. States she had not yet been notified of results of flu swab. After confirming pt identity via birthday, last 4 #of SS, and address pt advised flu screen was negative. Pt ? If she still needed to take the tamiflu prescribed yesterday. Advised she could discuss with pharmacist

## 2016-12-08 ENCOUNTER — Encounter (HOSPITAL_BASED_OUTPATIENT_CLINIC_OR_DEPARTMENT_OTHER): Payer: Self-pay

## 2016-12-08 ENCOUNTER — Emergency Department (HOSPITAL_BASED_OUTPATIENT_CLINIC_OR_DEPARTMENT_OTHER): Payer: BLUE CROSS/BLUE SHIELD

## 2016-12-08 ENCOUNTER — Emergency Department (HOSPITAL_BASED_OUTPATIENT_CLINIC_OR_DEPARTMENT_OTHER)
Admission: EM | Admit: 2016-12-08 | Discharge: 2016-12-09 | Disposition: A | Discharge status: Home / Self Care | Payer: BLUE CROSS/BLUE SHIELD | Attending: Emergency Medicine | Admitting: Emergency Medicine

## 2016-12-08 DIAGNOSIS — E860 Dehydration: Secondary | ICD-10-CM | POA: Diagnosis not present

## 2016-12-08 DIAGNOSIS — R05 Cough: Secondary | ICD-10-CM | POA: Diagnosis not present

## 2016-12-08 DIAGNOSIS — Z79899 Other long term (current) drug therapy: Secondary | ICD-10-CM | POA: Insufficient documentation

## 2016-12-08 DIAGNOSIS — H53149 Visual discomfort, unspecified: Secondary | ICD-10-CM | POA: Diagnosis not present

## 2016-12-08 DIAGNOSIS — R51 Headache: Secondary | ICD-10-CM | POA: Diagnosis present

## 2016-12-08 DIAGNOSIS — M549 Dorsalgia, unspecified: Secondary | ICD-10-CM | POA: Insufficient documentation

## 2016-12-08 DIAGNOSIS — R110 Nausea: Secondary | ICD-10-CM

## 2016-12-08 DIAGNOSIS — Z791 Long term (current) use of non-steroidal anti-inflammatories (NSAID): Secondary | ICD-10-CM | POA: Diagnosis not present

## 2016-12-08 DIAGNOSIS — R5383 Other fatigue: Secondary | ICD-10-CM | POA: Diagnosis not present

## 2016-12-08 MED ORDER — SODIUM CHLORIDE 0.9 % IV BOLUS (SEPSIS)
1000.0000 mL | Freq: Once | INTRAVENOUS | Status: AC
  Administered 2016-12-08: 1000 mL via INTRAVENOUS

## 2016-12-08 MED ORDER — PROCHLORPERAZINE EDISYLATE 5 MG/ML IJ SOLN
10.0000 mg | Freq: Once | INTRAMUSCULAR | Status: AC
  Administered 2016-12-08: 10 mg via INTRAVENOUS
  Filled 2016-12-08: qty 2

## 2016-12-08 MED ORDER — DIPHENHYDRAMINE HCL 50 MG/ML IJ SOLN
25.0000 mg | Freq: Once | INTRAMUSCULAR | Status: AC
  Administered 2016-12-08: 25 mg via INTRAVENOUS
  Filled 2016-12-08: qty 1

## 2016-12-08 NOTE — ED Provider Notes (Signed)
[facility] Note   CSN: [id number] Arrival date & time: 12/08/16  1906  By signing my name below, I, Modena JanskyAlbert Thayil, attest that this documentation has been prepared under the direction and in the presence of Heide Scaleshristopher J Tegeler, MD. Electronically Signed: Modena JanskyAlbert Thayil, Scribe. 12/08/2016. 9:36 PM.  History   Chief Complaint Chief Complaint  Patient presents with  . Pneumonia   The history is provided by the patient. No language interpreter was used.   HPI Comments: Grace Bishop is a 31 y.o. female With a diagnosis of pneumonia made yesterday currently on azithromycin who presents to the Emergency Department complaining of constant moderate headache that started today As well as myalgias, nausea, and diarrhea. She states she has been having gradually worsening pain since 2 days. She was diagnosed with pneumonia yesterday and started on azithromycin. She returned today due to onset of new worsening symptoms. She reports associated cough (improving), back pain, shoulder pain, nausea, diarrhea, photophobia, and generalized myalgias. She denies any dysuria or other complaints.   Patient denies any palpitations, chest pain, dysuria. She does report she had some diarrhea begin. She reports that she is able to tolerate some oral food and fluids.    PCP: Aubery LappingGEIGLER, BRYAN JAMES, PA  Past Medical History:  Diagnosis Date  . Anxiety   . Lupus   . Migraines   . Ovarian cyst     There are no active problems to display for this patient.   Past Surgical History:  Procedure Laterality Date  . OVARIAN CYST REMOVAL    . TONSILLECTOMY      OB History    No data available       Home Medications    Prior to Admission medications   Medication Sig Start Date End Date Taking? Authorizing Provider  amoxicillin-clavulanate (AUGMENTIN XR) 1000-62.5 MG 12 hr tablet Take 2 tablets by mouth 2 (two) times daily. 12/06/16   Tilden FossaElizabeth Rees, MD  azithromycin (ZITHROMAX) 250  MG tablet Take 1 tablet (250 mg total) by mouth daily. 12/07/16   Tilden FossaElizabeth Rees, MD  baclofen (LIORESAL) 10 MG tablet Take 10 mg by mouth 3 (three) times daily.    Historical Provider, MD  hydroxychloroquine (PLAQUENIL) 200 MG tablet Take 200 mg by mouth daily.    Historical Provider, MD  naproxen (NAPROSYN) 500 MG tablet Take 500 mg by mouth 2 (two) times daily with a meal.    Historical Provider, MD  oseltamivir (TAMIFLU) 75 MG capsule Take 1 capsule (75 mg total) by mouth every 12 (twelve) hours. 12/06/16   Tilden FossaElizabeth Rees, MD  promethazine (PHENERGAN) 25 MG tablet Take 25 mg by mouth every 6 (six) hours as needed for nausea or vomiting.    Historical Provider, MD    Family History No family history on file.  Social History Social History  Substance Use Topics  . Smoking status: Never Smoker  . Smokeless tobacco: Never Used  . Alcohol use No     Allergies   Patient has no known allergies.   Review of Systems Review of Systems  Constitutional: Negative for activity change, chills, diaphoresis, fatigue and fever.  HENT: Negative for congestion and rhinorrhea.   Eyes: Positive for photophobia. Negative for visual disturbance.  Respiratory: Positive for cough (improving). Negative for chest tightness, shortness of breath and stridor.   Cardiovascular: Negative for chest pain, palpitations and leg swelling.  Gastrointestinal: Positive for diarrhea and nausea. Negative for abdominal distention, abdominal pain, constipation and vomiting.  Genitourinary: Negative for  difficulty urinating, dysuria, flank pain, frequency, hematuria, menstrual problem, pelvic pain, vaginal bleeding and vaginal discharge.  Musculoskeletal: Positive for back pain and myalgias (Generalized). Negative for neck pain.  Skin: Negative for rash and wound.  Neurological: Positive for headaches. Negative for dizziness, weakness, light-headedness and numbness.  Psychiatric/Behavioral: Negative for agitation and  confusion.  All other systems reviewed and are negative.    Physical Exam Updated Vital Signs BP 109/84 (BP Location: Right Arm)   Pulse 78   Temp 98.8 F (37.1 C) (Oral)   Resp 16   Ht 5\' 2"  (1.575 m)   Wt 124 lb (56.2 kg)   LMP 11/19/2016 (Exact Date)   SpO2 100%   BMI 22.68 kg/m   Physical Exam  Constitutional: She is oriented to person, place, and time. She appears well-developed and well-nourished. No distress.  HENT:  Head: Normocephalic and atraumatic.  Right Ear: External ear normal.  Left Ear: External ear normal.  Nose: Nose normal.  Mouth/Throat: Oropharynx is clear and moist. No oropharyngeal exudate.  Congestion.   Eyes: Conjunctivae and EOM are normal. Pupils are equal, round, and reactive to light.  Neck: Normal range of motion. Neck supple.  Cardiovascular: Normal rate, normal heart sounds and intact distal pulses.   No murmur heard. Pulmonary/Chest: Effort normal. No stridor. No respiratory distress. She has no wheezes. She has no rales.  Abdominal: She exhibits no distension. There is no tenderness. There is no rebound.  Musculoskeletal:  Paraspinal back TTP.   Neurological: She is alert and oriented to person, place, and time. She has normal reflexes. She exhibits normal muscle tone. Coordination normal.  Skin: Skin is warm. No rash noted. She is not diaphoretic. No erythema.  Nursing note and vitals reviewed.    ED Treatments / Results  DIAGNOSTIC STUDIES: Oxygen Saturation is 100% on RA, normal by my interpretation.    COORDINATION OF CARE: 9:40 PM- Pt advised of plan for treatment and pt agrees.  Labs (all labs ordered are listed, but only abnormal results are displayed) Labs Reviewed - No data to display  EKG  EKG Interpretation None       Radiology Dg Chest 2 View  Result Date: 12/08/2016 CLINICAL DATA:  Body ache with diarrhea and fatigue. EXAM: CHEST  2 VIEW COMPARISON:  12/06/2016 FINDINGS: Airspace disease seen previously at  the right lung base has decreased in the interval. Left lung remains clear. The cardiopericardial silhouette is within normal limits for size. The visualized bony structures of the thorax are intact. IMPRESSION: Interval decrease in right basilar airspace disease. Electronically Signed   By: Kennith Center M.D.   On: 12/08/2016 20:04    Procedures Procedures (including critical care time)  Medications Ordered in ED Medications  sodium chloride 0.9 % bolus 1,000 mL (0 mLs Intravenous Stopped 12/08/16 2332)  sodium chloride 0.9 % bolus 1,000 mL (0 mLs Intravenous Stopped 12/09/16 0036)  prochlorperazine (COMPAZINE) injection 10 mg (10 mg Intravenous Given 12/08/16 2200)  diphenhydrAMINE (BENADRYL) injection 25 mg (25 mg Intravenous Given 12/08/16 2159)     Initial Impression / Assessment and Plan / ED Course  I have reviewed the triage vital signs and the nursing notes.  Pertinent labs & imaging results that were available during my care of the patient were reviewed by me and considered in my medical decision making (see chart for details).     Grace Bishop is a 31 y.o. female With a diagnosis of pneumonia made yesterday currently on azithromycin who presents to  the Emergency Department complaining of constant moderate headache that started today As well as myalgias, nausea, and diarrhea.  History and exam are seen above. On exam, patient's lungs are clear. Patient appears well. Patient had no tenderness on exam. No focal neurologic deficits.  Given patient's description of symptoms, suspect she still has generalized symptoms from her current pneumonia and possible addition of a viral co-infection. Patient says that she has not been able to take any nausea medicine and feels that she might be getting dehydrated. Patient also reports onset of headache prompting her to seek evaluation.  Given reassuring exam, patient will be given fluids and a headache  Cocktail. Patient had repeat chest x-ray  showing improvement in pneumonia.  After medicines, patient reported resolution of headache and she felt much better. Patient given prescriptions for nausea medication and instructions to continue her outpatient management of pneumonia.  Patient informed that she will likely feel that for the next few days as her body gets over this infection. Patient understood instructions to follow up with PCP as well as return for any new or worsening symptoms.  Patient had no other questions or concerns and was discharged in good condition with resolution of presenting headache.    Final Clinical Impressions(s) / ED Diagnoses   Final diagnoses:  Fatigue, unspecified type  Dehydration  Nausea    New Prescriptions Discharge Medication List as of 12/09/2016 12:19 AM    START taking these medications   Details  ondansetron (ZOFRAN) 4 MG tablet Take 1 tablet (4 mg total) by mouth every 8 (eight) hours as needed for nausea or vomiting., Starting Wed 12/09/2016, Print       I personally performed the services described in this documentation, which was scribed in my presence. The recorded information has been reviewed and is accurate.  Clinical Impression: 1. Fatigue, unspecified type   2. Dehydration   3. Nausea     Disposition: Discharge  Condition: Good  I have discussed the results, Dx and Tx plan with the pt(& family if present). He/she/they expressed understanding and agree(s) with the plan. Discharge instructions discussed at great length. Strict return precautions discussed and pt &/or family have verbalized understanding of the instructions. No further questions at time of discharge.    Discharge Medication List as of 12/09/2016 12:19 AM    START taking these medications   Details  ondansetron (ZOFRAN) 4 MG tablet Take 1 tablet (4 mg total) by mouth every 8 (eight) hours as needed for nausea or vomiting., Starting Wed 12/09/2016, Print        Follow Up: No follow-up provider  specified.     Canary Brim Tegeler, MD 12/09/16 1009

## 2016-12-08 NOTE — ED Triage Notes (Signed)
Pt states she was dx with PNE 2 days ago-c/o cont'd body ache, diarrhea and fatigue

## 2016-12-08 NOTE — ED Notes (Signed)
ED Provider at bedside. 

## 2016-12-09 MED ORDER — ONDANSETRON HCL 4 MG PO TABS
4.0000 mg | ORAL_TABLET | Freq: Three times a day (TID) | ORAL | 0 refills | Status: DC | PRN
Start: 2016-12-09 — End: 2016-12-24

## 2016-12-09 NOTE — Discharge Instructions (Signed)
Please continue taking your antibiotic as previously prescribed for your pneumonia. Your pneumonia was looking better on the x-ray. Please continue to stay hydrated and take your nausea medicine as needed to prevent vomiting. Please follow-up with your primary care physician in the next few days and if any symptoms worsen, please return to the nearest emergency department.

## 2016-12-23 ENCOUNTER — Encounter (HOSPITAL_BASED_OUTPATIENT_CLINIC_OR_DEPARTMENT_OTHER): Payer: Self-pay

## 2016-12-23 ENCOUNTER — Emergency Department (HOSPITAL_BASED_OUTPATIENT_CLINIC_OR_DEPARTMENT_OTHER)
Admission: EM | Admit: 2016-12-23 | Discharge: 2016-12-24 | Disposition: A | Discharge status: Home / Self Care | Payer: BLUE CROSS/BLUE SHIELD | Source: Home / Self Care | Attending: Emergency Medicine | Admitting: Emergency Medicine

## 2016-12-23 ENCOUNTER — Emergency Department (HOSPITAL_BASED_OUTPATIENT_CLINIC_OR_DEPARTMENT_OTHER): Payer: BLUE CROSS/BLUE SHIELD

## 2016-12-23 DIAGNOSIS — Z5181 Encounter for therapeutic drug level monitoring: Secondary | ICD-10-CM

## 2016-12-23 DIAGNOSIS — G43119 Migraine with aura, intractable, without status migrainosus: Secondary | ICD-10-CM | POA: Diagnosis not present

## 2016-12-23 DIAGNOSIS — G43001 Migraine without aura, not intractable, with status migrainosus: Secondary | ICD-10-CM | POA: Insufficient documentation

## 2016-12-23 DIAGNOSIS — R4182 Altered mental status, unspecified: Secondary | ICD-10-CM | POA: Diagnosis not present

## 2016-12-23 LAB — CBC WITH DIFFERENTIAL/PLATELET
Basophils Absolute: 0 10*3/uL (ref 0.0–0.1)
Basophils Relative: 0 %
EOS PCT: 1 %
Eosinophils Absolute: 0 10*3/uL (ref 0.0–0.7)
HEMATOCRIT: 34.1 % — AB (ref 36.0–46.0)
HEMOGLOBIN: 11.5 g/dL — AB (ref 12.0–15.0)
LYMPHS ABS: 0.8 10*3/uL (ref 0.7–4.0)
Lymphocytes Relative: 14 %
MCH: 28 pg (ref 26.0–34.0)
MCHC: 33.7 g/dL (ref 30.0–36.0)
MCV: 83 fL (ref 78.0–100.0)
Monocytes Absolute: 0.6 10*3/uL (ref 0.1–1.0)
Monocytes Relative: 11 %
NEUTROS ABS: 4.2 10*3/uL (ref 1.7–7.7)
NEUTROS PCT: 74 %
Platelets: 222 10*3/uL (ref 150–400)
RBC: 4.11 MIL/uL (ref 3.87–5.11)
RDW: 12.4 % (ref 11.5–15.5)
WBC: 5.6 10*3/uL (ref 4.0–10.5)

## 2016-12-23 LAB — RAPID URINE DRUG SCREEN, HOSP PERFORMED
Amphetamines: NOT DETECTED
BARBITURATES: NOT DETECTED
BENZODIAZEPINES: NOT DETECTED
COCAINE: NOT DETECTED
Opiates: NOT DETECTED
TETRAHYDROCANNABINOL: NOT DETECTED

## 2016-12-23 LAB — PREGNANCY, URINE: Preg Test, Ur: NEGATIVE

## 2016-12-23 MED ORDER — SODIUM CHLORIDE 0.9 % IV BOLUS (SEPSIS)
1000.0000 mL | Freq: Once | INTRAVENOUS | Status: AC
  Administered 2016-12-24: 1000 mL via INTRAVENOUS

## 2016-12-23 NOTE — ED Triage Notes (Signed)
Migraine type headache that started 30 minutes prior to arrival that was unrelieved by home meds

## 2016-12-23 NOTE — ED Provider Notes (Signed)
[facility]   CSN: [id number] Arrival date & time: 12/23/16  2121  By signing my name below, I, Talbert Nan, attest that this documentation has been prepared under the direction and in the presence of Ryelee Albee, MD. Electronically Signed: Talbert Nan, Scribe. 12/23/16. 11:10 PM.    History   Chief Complaint Chief Complaint  Patient presents with  . Headache   LEVEL 5 CAVEAT: HPI and ROS limited due to medication effect  HPI Grace Bishop is a 31 y.o. female with h/o migraines who presents to the Emergency Department with her fiancee complaining of headache.. She is prescribed baclofen and magnesium. LMP started yesterday. This hx was given by her fiancee. No weakness no fever.  States there is nothing atypical about this HA.     The history is provided by the spouse. No language interpreter was used.  Migraine  This is a recurrent problem. The current episode started 12 to 24 hours ago. The problem occurs constantly. The problem has not changed since onset.Pertinent negatives include no chest pain, no abdominal pain and no shortness of breath. Nothing aggravates the symptoms. Nothing relieves the symptoms. Treatments tried: baclofen. The treatment provided no (now sleepy) relief.    Past Medical History:  Diagnosis Date  . Anxiety   . Lupus   . Migraines   . Ovarian cyst     There are no active problems to display for this patient.   Past Surgical History:  Procedure Laterality Date  . OVARIAN CYST REMOVAL    . TONSILLECTOMY      OB History    No data available       Home Medications    Prior to Admission medications   Medication Sig Start Date End Date Taking? Authorizing Provider  amoxicillin-clavulanate (AUGMENTIN XR) 1000-62.5 MG 12 hr tablet Take 2 tablets by mouth 2 (two) times daily. 12/06/16   Tilden Fossa, MD  azithromycin (ZITHROMAX) 250 MG tablet Take 1 tablet (250 mg total) by mouth daily. 12/07/16   Tilden Fossa, MD    baclofen (LIORESAL) 10 MG tablet Take 10 mg by mouth 3 (three) times daily.    Historical Provider, MD  hydroxychloroquine (PLAQUENIL) 200 MG tablet Take 200 mg by mouth daily.    Historical Provider, MD  naproxen (NAPROSYN) 500 MG tablet Take 500 mg by mouth 2 (two) times daily with a meal.    Historical Provider, MD  ondansetron (ZOFRAN) 4 MG tablet Take 1 tablet (4 mg total) by mouth every 8 (eight) hours as needed for nausea or vomiting. 12/09/16   Canary Brim Tegeler, MD  oseltamivir (TAMIFLU) 75 MG capsule Take 1 capsule (75 mg total) by mouth every 12 (twelve) hours. 12/06/16   Tilden Fossa, MD  promethazine (PHENERGAN) 25 MG tablet Take 25 mg by mouth every 6 (six) hours as needed for nausea or vomiting.    Historical Provider, MD    Family History No family history on file.  Social History Social History  Substance Use Topics  . Smoking status: Never Smoker  . Smokeless tobacco: Never Used  . Alcohol use No     Allergies   Patient has no known allergies.   Review of Systems Review of Systems  Unable to perform ROS: Mental status change  Constitutional: Negative for fever.  Eyes: Negative for photophobia and visual disturbance.  Respiratory: Negative for shortness of breath.   Cardiovascular: Negative for chest pain.  Gastrointestinal: Negative for abdominal pain.  Musculoskeletal: Negative for neck  pain and neck stiffness.  Neurological: Negative for dizziness, tremors, seizures, syncope, facial asymmetry, speech difficulty, weakness, light-headedness and numbness.  Hematological: Negative for adenopathy.  Psychiatric/Behavioral: Negative for behavioral problems and confusion.  All other systems reviewed and are negative.    Physical Exam Updated Vital Signs BP 118/78 (BP Location: Right Arm)   Pulse 86   Temp 98.3 F (36.8 C) (Oral)   Resp 18   Ht 5\' 2"  (1.575 m)   Wt 124 lb (56.2 kg)   LMP 12/18/2016   SpO2 100%   BMI 22.68 kg/m   Physical Exam   Constitutional: She appears well-developed and well-nourished. No distress.  HENT:  Head: Normocephalic and atraumatic.  Mouth/Throat: No oropharyngeal exudate.  Moist mucous membrane.  Eyes: EOM are normal. Pupils are equal, round, and reactive to light. Right eye exhibits no discharge.  Neck: Normal range of motion. Neck supple.  Neck supple. Moist mucous membranes.  Cardiovascular: Normal rate, regular rhythm and intact distal pulses.   Pulmonary/Chest: Effort normal. She has no wheezes. She has no rales.  Abdominal: Soft. Bowel sounds are normal.  Good bowel sounds.  Musculoskeletal: Normal range of motion. She exhibits no deformity.  Lymphadenopathy:    She has no cervical adenopathy.  Neurological: She is alert. She displays normal reflexes. No cranial nerve deficit. She exhibits normal muscle tone. Coordination normal.  Skin: Skin is warm and dry. Capillary refill takes less than 2 seconds.  Intact distal pulses.  Psychiatric: She has a normal mood and affect.  Nursing note and vitals reviewed.    ED Treatments / Results   Vitals:   12/23/16 2127  BP: 118/78  Pulse: 86  Resp: 18  Temp: 98.3 F (36.8 C)    DIAGNOSTIC STUDIES: Oxygen Saturation is 100% on room air, normal by my interpretation.    COORDINATION OF CARE: 11:09 PM Discussed treatment plan with pt at bedside and pt agreed to plan.    Results for orders placed or performed during the hospital encounter of 12/23/16  CBC with Differential/Platelet  Result Value Ref Range   WBC 5.6 4.0 - 10.5 K/uL   RBC 4.11 3.87 - 5.11 MIL/uL   Hemoglobin 11.5 (L) 12.0 - 15.0 g/dL   HCT 16.1 (L) 09.6 - 04.5 %   MCV 83.0 78.0 - 100.0 fL   MCH 28.0 26.0 - 34.0 pg   MCHC 33.7 30.0 - 36.0 g/dL   RDW 40.9 81.1 - 91.4 %   Platelets 222 150 - 400 K/uL   Neutrophils Relative % 74 %   Neutro Abs 4.2 1.7 - 7.7 K/uL   Lymphocytes Relative 14 %   Lymphs Abs 0.8 0.7 - 4.0 K/uL   Monocytes Relative 11 %   Monocytes  Absolute 0.6 0.1 - 1.0 K/uL   Eosinophils Relative 1 %   Eosinophils Absolute 0.0 0.0 - 0.7 K/uL   Basophils Relative 0 %   Basophils Absolute 0.0 0.0 - 0.1 K/uL  Pregnancy, urine  Result Value Ref Range   Preg Test, Ur NEGATIVE NEGATIVE  Rapid urine drug screen (hospital performed)  Result Value Ref Range   Opiates NONE DETECTED NONE DETECTED   Cocaine NONE DETECTED NONE DETECTED   Benzodiazepines NONE DETECTED NONE DETECTED   Amphetamines NONE DETECTED NONE DETECTED   Tetrahydrocannabinol NONE DETECTED NONE DETECTED   Barbiturates NONE DETECTED NONE DETECTED  Ethanol  Result Value Ref Range   Alcohol, Ethyl (B) <5 <5 mg/dL  Salicylate level  Result Value Ref Range  Salicylate Lvl <7.0 2.8 - 30.0 mg/dL  Acetaminophen level  Result Value Ref Range   Acetaminophen (Tylenol), Serum <10 (L) 10 - 30 ug/mL  Comprehensive metabolic panel  Result Value Ref Range   Sodium 134 (L) 135 - 145 mmol/L   Potassium 3.1 (L) 3.5 - 5.1 mmol/L   Chloride 104 101 - 111 mmol/L   CO2 23 22 - 32 mmol/L   Glucose, Bld 164 (H) 65 - 99 mg/dL   BUN 9 6 - 20 mg/dL   Creatinine, Ser 1.61 0.44 - 1.00 mg/dL   Calcium 9.2 8.9 - 09.6 mg/dL   Total Protein 8.3 (H) 6.5 - 8.1 g/dL   Albumin 4.1 3.5 - 5.0 g/dL   AST 27 15 - 41 U/L   ALT 24 14 - 54 U/L   Alkaline Phosphatase 42 38 - 126 U/L   Total Bilirubin 0.5 0.3 - 1.2 mg/dL   GFR calc non Af Amer >60 >60 mL/min   GFR calc Af Amer >60 >60 mL/min   Anion gap 7 5 - 15   Dg Chest 2 View  Result Date: 12/08/2016 CLINICAL DATA:  Body ache with diarrhea and fatigue. EXAM: CHEST  2 VIEW COMPARISON:  12/06/2016 FINDINGS: Airspace disease seen previously at the right lung base has decreased in the interval. Left lung remains clear. The cardiopericardial silhouette is within normal limits for size. The visualized bony structures of the thorax are intact. IMPRESSION: Interval decrease in right basilar airspace disease. Electronically Signed   By: Kennith Center  M.D.   On: 12/08/2016 20:04   Dg Chest 2 View  Result Date: 12/06/2016 CLINICAL DATA:  Cough, 1 day duration.  Bilateral arm and leg pain. EXAM: CHEST  2 VIEW COMPARISON:  None. FINDINGS: Heart size is normal. Mediastinal shadows are normal. The left lung is clear. There is right middle lobe pneumonia. No effusions. No bony abnormalities. IMPRESSION: Right middle lobe pneumonia Electronically Signed   By: Paulina Fusi M.D.   On: 12/06/2016 09:56   Ct Head Wo Contrast  Result Date: 12/24/2016 CLINICAL DATA:  31 y/o  F; headache. EXAM: CT HEAD WITHOUT CONTRAST TECHNIQUE: Contiguous axial images were obtained from the base of the skull through the vertex without intravenous contrast. COMPARISON:  None. FINDINGS: Brain: No evidence of acute infarction, hemorrhage, hydrocephalus, extra-axial collection or mass lesion/mass effect. Vascular: No hyperdense vessel or unexpected calcification. Skull: Normal. Negative for fracture or focal lesion. Sinuses/Orbits: No acute finding. Other: Numerous punctate calcifications within the parotid glands. IMPRESSION: 1. No acute intracranial abnormality identified. Unremarkable CT of the brain for age. 2. Parotid calcifications likely representing sequelae of prior infectious or inflammatory process. Electronically Signed   By: Mitzi Hansen M.D.   On: 12/24/2016 00:04    Radiology No results found.  Procedures Procedures (including critical care time)  Medications Ordered in ED  Results for orders placed or performed during the hospital encounter of 12/23/16  CBC with Differential/Platelet  Result Value Ref Range   WBC 5.6 4.0 - 10.5 K/uL   RBC 4.11 3.87 - 5.11 MIL/uL   Hemoglobin 11.5 (L) 12.0 - 15.0 g/dL   HCT 04.5 (L) 40.9 - 81.1 %   MCV 83.0 78.0 - 100.0 fL   MCH 28.0 26.0 - 34.0 pg   MCHC 33.7 30.0 - 36.0 g/dL   RDW 91.4 78.2 - 95.6 %   Platelets 222 150 - 400 K/uL   Neutrophils Relative % 74 %   Neutro Abs 4.2 1.7 -  7.7 K/uL    Lymphocytes Relative 14 %   Lymphs Abs 0.8 0.7 - 4.0 K/uL   Monocytes Relative 11 %   Monocytes Absolute 0.6 0.1 - 1.0 K/uL   Eosinophils Relative 1 %   Eosinophils Absolute 0.0 0.0 - 0.7 K/uL   Basophils Relative 0 %   Basophils Absolute 0.0 0.0 - 0.1 K/uL  Pregnancy, urine  Result Value Ref Range   Preg Test, Ur NEGATIVE NEGATIVE  Rapid urine drug screen (hospital performed)  Result Value Ref Range   Opiates NONE DETECTED NONE DETECTED   Cocaine NONE DETECTED NONE DETECTED   Benzodiazepines NONE DETECTED NONE DETECTED   Amphetamines NONE DETECTED NONE DETECTED   Tetrahydrocannabinol NONE DETECTED NONE DETECTED   Barbiturates NONE DETECTED NONE DETECTED  Ethanol  Result Value Ref Range   Alcohol, Ethyl (B) <5 <5 mg/dL  Salicylate level  Result Value Ref Range   Salicylate Lvl <7.0 2.8 - 30.0 mg/dL  Acetaminophen level  Result Value Ref Range   Acetaminophen (Tylenol), Serum <10 (L) 10 - 30 ug/mL  Comprehensive metabolic panel  Result Value Ref Range   Sodium 134 (L) 135 - 145 mmol/L   Potassium 3.1 (L) 3.5 - 5.1 mmol/L   Chloride 104 101 - 111 mmol/L   CO2 23 22 - 32 mmol/L   Glucose, Bld 164 (H) 65 - 99 mg/dL   BUN 9 6 - 20 mg/dL   Creatinine, Ser 1.61 0.44 - 1.00 mg/dL   Calcium 9.2 8.9 - 09.6 mg/dL   Total Protein 8.3 (H) 6.5 - 8.1 g/dL   Albumin 4.1 3.5 - 5.0 g/dL   AST 27 15 - 41 U/L   ALT 24 14 - 54 U/L   Alkaline Phosphatase 42 38 - 126 U/L   Total Bilirubin 0.5 0.3 - 1.2 mg/dL   GFR calc non Af Amer >60 >60 mL/min   GFR calc Af Amer >60 >60 mL/min   Anion gap 7 5 - 15   Dg Chest 2 View  Result Date: 12/08/2016 CLINICAL DATA:  Body ache with diarrhea and fatigue. EXAM: CHEST  2 VIEW COMPARISON:  12/06/2016 FINDINGS: Airspace disease seen previously at the right lung base has decreased in the interval. Left lung remains clear. The cardiopericardial silhouette is within normal limits for size. The visualized bony structures of the thorax are intact. IMPRESSION:  Interval decrease in right basilar airspace disease. Electronically Signed   By: Kennith Center M.D.   On: 12/08/2016 20:04   Dg Chest 2 View  Result Date: 12/06/2016 CLINICAL DATA:  Cough, 1 day duration.  Bilateral arm and leg pain. EXAM: CHEST  2 VIEW COMPARISON:  None. FINDINGS: Heart size is normal. Mediastinal shadows are normal. The left lung is clear. There is right middle lobe pneumonia. No effusions. No bony abnormalities. IMPRESSION: Right middle lobe pneumonia Electronically Signed   By: Paulina Fusi M.D.   On: 12/06/2016 09:56   Ct Head Wo Contrast  Result Date: 12/24/2016 CLINICAL DATA:  31 y/o  F; headache. EXAM: CT HEAD WITHOUT CONTRAST TECHNIQUE: Contiguous axial images were obtained from the base of the skull through the vertex without intravenous contrast. COMPARISON:  None. FINDINGS: Brain: No evidence of acute infarction, hemorrhage, hydrocephalus, extra-axial collection or mass lesion/mass effect. Vascular: No hyperdense vessel or unexpected calcification. Skull: Normal. Negative for fracture or focal lesion. Sinuses/Orbits: No acute finding. Other: Numerous punctate calcifications within the parotid glands. IMPRESSION: 1. No acute intracranial abnormality identified. Unremarkable CT of the brain  for age. 2. Parotid calcifications likely representing sequelae of prior infectious or inflammatory process. Electronically Signed   By: Mitzi HansenLance  Furusawa-Stratton M.D.   On: 12/24/2016 00:04       Final Clinical Impressions(s) / ED Diagnoses  Migraine: follow up with your neurologist.  All questions answered to patient's satisfaction. Based on history and exam patient has been appropriately medically screened and emergency conditions excluded. Patient is stable for discharge at this time. Strict return precautions given for any further episodes, persistent fever, weakness or any concerns. New Prescriptions New Prescriptions   No medications on file   I personally performed the services  described in this documentation, which was scribed in my presence. The recorded information has been reviewed and is accurate.       Cy BlamerApril Rayonna Heldman, MD 12/24/16 [phone]

## 2016-12-23 NOTE — ED Notes (Signed)
Nurse first-pt's husband to desk-pt crying, c/o HA-notified on cont'd wait due to no beds available-advised pt could wait in bistro area where there are less people/noise-agreeable-taken to bistro area via w/c by husband

## 2016-12-24 ENCOUNTER — Encounter (HOSPITAL_BASED_OUTPATIENT_CLINIC_OR_DEPARTMENT_OTHER): Payer: Self-pay | Admitting: Emergency Medicine

## 2016-12-24 ENCOUNTER — Emergency Department (HOSPITAL_BASED_OUTPATIENT_CLINIC_OR_DEPARTMENT_OTHER): Payer: BLUE CROSS/BLUE SHIELD

## 2016-12-24 ENCOUNTER — Encounter (HOSPITAL_BASED_OUTPATIENT_CLINIC_OR_DEPARTMENT_OTHER): Payer: Self-pay | Admitting: *Deleted

## 2016-12-24 ENCOUNTER — Inpatient Hospital Stay (HOSPITAL_COMMUNITY): Payer: BLUE CROSS/BLUE SHIELD

## 2016-12-24 ENCOUNTER — Inpatient Hospital Stay (HOSPITAL_BASED_OUTPATIENT_CLINIC_OR_DEPARTMENT_OTHER)
Admission: EM | Admit: 2016-12-24 | Discharge: 2016-12-26 | DRG: 102 | Disposition: A | Discharge status: Home / Self Care | Payer: BLUE CROSS/BLUE SHIELD | Attending: Internal Medicine | Admitting: Internal Medicine

## 2016-12-24 DIAGNOSIS — R4182 Altered mental status, unspecified: Secondary | ICD-10-CM | POA: Diagnosis present

## 2016-12-24 DIAGNOSIS — Z8349 Family history of other endocrine, nutritional and metabolic diseases: Secondary | ICD-10-CM

## 2016-12-24 DIAGNOSIS — R51 Headache: Secondary | ICD-10-CM | POA: Diagnosis not present

## 2016-12-24 DIAGNOSIS — G9341 Metabolic encephalopathy: Secondary | ICD-10-CM | POA: Diagnosis present

## 2016-12-24 DIAGNOSIS — M329 Systemic lupus erythematosus, unspecified: Secondary | ICD-10-CM | POA: Diagnosis present

## 2016-12-24 DIAGNOSIS — Z79899 Other long term (current) drug therapy: Secondary | ICD-10-CM | POA: Diagnosis not present

## 2016-12-24 DIAGNOSIS — R29810 Facial weakness: Secondary | ICD-10-CM | POA: Diagnosis present

## 2016-12-24 DIAGNOSIS — Z87891 Personal history of nicotine dependence: Secondary | ICD-10-CM | POA: Diagnosis not present

## 2016-12-24 DIAGNOSIS — G43119 Migraine with aura, intractable, without status migrainosus: Secondary | ICD-10-CM | POA: Diagnosis present

## 2016-12-24 DIAGNOSIS — D638 Anemia in other chronic diseases classified elsewhere: Secondary | ICD-10-CM | POA: Diagnosis present

## 2016-12-24 DIAGNOSIS — E876 Hypokalemia: Secondary | ICD-10-CM | POA: Diagnosis present

## 2016-12-24 DIAGNOSIS — G9340 Encephalopathy, unspecified: Secondary | ICD-10-CM | POA: Diagnosis not present

## 2016-12-24 DIAGNOSIS — R519 Headache, unspecified: Secondary | ICD-10-CM

## 2016-12-24 DIAGNOSIS — G44201 Tension-type headache, unspecified, intractable: Secondary | ICD-10-CM | POA: Diagnosis not present

## 2016-12-24 DIAGNOSIS — G0490 Encephalitis and encephalomyelitis, unspecified: Secondary | ICD-10-CM

## 2016-12-24 DIAGNOSIS — R400 Somnolence: Secondary | ICD-10-CM | POA: Diagnosis not present

## 2016-12-24 DIAGNOSIS — E039 Hypothyroidism, unspecified: Secondary | ICD-10-CM | POA: Diagnosis present

## 2016-12-24 HISTORY — DX: Disorder of thyroid, unspecified: E07.9

## 2016-12-24 LAB — COMPREHENSIVE METABOLIC PANEL
ALBUMIN: 4.1 g/dL (ref 3.5–5.0)
ALT: 24 U/L (ref 14–54)
AST: 27 U/L (ref 15–41)
Alkaline Phosphatase: 42 U/L (ref 38–126)
Anion gap: 7 (ref 5–15)
BUN: 9 mg/dL (ref 6–20)
CHLORIDE: 104 mmol/L (ref 101–111)
CO2: 23 mmol/L (ref 22–32)
CREATININE: 0.54 mg/dL (ref 0.44–1.00)
Calcium: 9.2 mg/dL (ref 8.9–10.3)
GFR calc Af Amer: >60 mL/min (ref >60)
GLUCOSE: 164 mg/dL — AB (ref 65–99)
POTASSIUM: 3.1 mmol/L — AB (ref 3.5–5.1)
Sodium: 134 mmol/L — ABNORMAL LOW (ref 135–145)
Total Bilirubin: 0.5 mg/dL (ref 0.3–1.2)
Total Protein: 8.3 g/dL — ABNORMAL HIGH (ref 6.5–8.1)

## 2016-12-24 LAB — URINALYSIS, ROUTINE W REFLEX MICROSCOPIC
Bilirubin Urine: NEGATIVE
GLUCOSE, UA: NEGATIVE mg/dL
HGB URINE DIPSTICK: NEGATIVE
KETONES UR: 40 mg/dL — AB
LEUKOCYTES UA: NEGATIVE
Nitrite: NEGATIVE
Protein, ur: NEGATIVE mg/dL
Specific Gravity, Urine: 1.017 (ref 1.005–1.030)
pH: 7 (ref 5.0–8.0)

## 2016-12-24 LAB — BASIC METABOLIC PANEL
ANION GAP: 8 (ref 5–15)
BUN: 8 mg/dL (ref 6–20)
CALCIUM: 9.1 mg/dL (ref 8.9–10.3)
CO2: 21 mmol/L — AB (ref 22–32)
Chloride: 107 mmol/L (ref 101–111)
Creatinine, Ser: 0.51 mg/dL (ref 0.44–1.00)
GFR calc Af Amer: >60 mL/min (ref >60)
GLUCOSE: 135 mg/dL — AB (ref 65–99)
Potassium: 3.7 mmol/L (ref 3.5–5.1)
Sodium: 136 mmol/L (ref 135–145)

## 2016-12-24 LAB — CBC WITH DIFFERENTIAL/PLATELET
BASOS ABS: 0 10*3/uL (ref 0.0–0.1)
Basophils Relative: 0 %
EOS PCT: 0 %
Eosinophils Absolute: 0 10*3/uL (ref 0.0–0.7)
HEMATOCRIT: 32.4 % — AB (ref 36.0–46.0)
Hemoglobin: 10.9 g/dL — ABNORMAL LOW (ref 12.0–15.0)
LYMPHS ABS: 0.4 10*3/uL — AB (ref 0.7–4.0)
LYMPHS PCT: 11 %
MCH: 27.8 pg (ref 26.0–34.0)
MCHC: 33.6 g/dL (ref 30.0–36.0)
MCV: 82.7 fL (ref 78.0–100.0)
MONO ABS: 0.3 10*3/uL (ref 0.1–1.0)
MONOS PCT: 9 %
Neutro Abs: 3 10*3/uL (ref 1.7–7.7)
Neutrophils Relative %: 80 %
PLATELETS: 201 10*3/uL (ref 150–400)
RBC: 3.92 MIL/uL (ref 3.87–5.11)
RDW: 12.1 % (ref 11.5–15.5)
WBC: 3.8 10*3/uL — ABNORMAL LOW (ref 4.0–10.5)

## 2016-12-24 LAB — SEDIMENTATION RATE: SED RATE: 55 mm/h — AB (ref 0–22)

## 2016-12-24 LAB — ETHANOL: Alcohol, Ethyl (B): <5 mg/dL (ref <5)

## 2016-12-24 LAB — ACETAMINOPHEN LEVEL

## 2016-12-24 LAB — SALICYLATE LEVEL

## 2016-12-24 LAB — CBG MONITORING, ED: GLUCOSE-CAPILLARY: 128 mg/dL — AB (ref 65–99)

## 2016-12-24 LAB — C-REACTIVE PROTEIN

## 2016-12-24 MED ORDER — DEXAMETHASONE SODIUM PHOSPHATE 10 MG/ML IJ SOLN
10.0000 mg | Freq: Once | INTRAMUSCULAR | Status: AC
  Administered 2016-12-24: 10 mg via INTRAVENOUS
  Filled 2016-12-24: qty 1

## 2016-12-24 MED ORDER — ACETAMINOPHEN 325 MG PO TABS
650.0000 mg | ORAL_TABLET | Freq: Four times a day (QID) | ORAL | Status: DC | PRN
  Administered 2016-12-25 – 2016-12-26 (×4): 650 mg via ORAL
  Filled 2016-12-24 (×4): qty 2

## 2016-12-24 MED ORDER — HYDROXYCHLOROQUINE SULFATE 200 MG PO TABS
200.0000 mg | ORAL_TABLET | Freq: Every day | ORAL | Status: DC
  Administered 2016-12-25 – 2016-12-26 (×2): 200 mg via ORAL
  Filled 2016-12-24 (×2): qty 1

## 2016-12-24 MED ORDER — VANCOMYCIN HCL 500 MG IV SOLR
500.0000 mg | Freq: Three times a day (TID) | INTRAVENOUS | Status: DC
  Administered 2016-12-24 – 2016-12-25 (×3): 500 mg via INTRAVENOUS
  Filled 2016-12-24 (×5): qty 500

## 2016-12-24 MED ORDER — SODIUM CHLORIDE 0.9 % IV SOLN
INTRAVENOUS | Status: DC
  Administered 2016-12-24 – 2016-12-25 (×3): via INTRAVENOUS
  Administered 2016-12-26: 150 mL/h via INTRAVENOUS

## 2016-12-24 MED ORDER — ONDANSETRON HCL 4 MG PO TABS: 4.0000 mg | ORAL_TABLET | Freq: Four times a day (QID) | ORAL | Status: DC | PRN

## 2016-12-24 MED ORDER — ONDANSETRON 8 MG PO TBDP
8.0000 mg | ORAL_TABLET | Freq: Once | ORAL | Status: AC
  Administered 2016-12-24: 8 mg via ORAL
  Filled 2016-12-24: qty 1

## 2016-12-24 MED ORDER — ONDANSETRON HCL 4 MG/2ML IJ SOLN
4.0000 mg | Freq: Four times a day (QID) | INTRAMUSCULAR | Status: DC | PRN
  Administered 2016-12-26: 4 mg via INTRAVENOUS
  Filled 2016-12-24: qty 2

## 2016-12-24 MED ORDER — SODIUM CHLORIDE 0.9 % IV SOLN
INTRAVENOUS | Status: DC
  Administered 2016-12-24: 09:00:00 via INTRAVENOUS

## 2016-12-24 MED ORDER — LORAZEPAM 2 MG/ML IJ SOLN
1.0000 mg | Freq: Once | INTRAMUSCULAR | Status: AC
  Administered 2016-12-24: 1 mg via INTRAVENOUS
  Filled 2016-12-24: qty 1

## 2016-12-24 MED ORDER — ACETAMINOPHEN 650 MG RE SUPP
650.0000 mg | Freq: Four times a day (QID) | RECTAL | Status: DC | PRN
  Administered 2016-12-25: 650 mg via RECTAL
  Filled 2016-12-24: qty 1

## 2016-12-24 MED ORDER — VANCOMYCIN HCL IN DEXTROSE 1-5 GM/200ML-% IV SOLN
1000.0000 mg | Freq: Once | INTRAVENOUS | Status: AC
  Administered 2016-12-24: 1000 mg via INTRAVENOUS
  Filled 2016-12-24: qty 200

## 2016-12-24 MED ORDER — DEXTROSE 5 % IV SOLN
2.0000 g | Freq: Once | INTRAVENOUS | Status: AC
  Administered 2016-12-24: 2 g via INTRAVENOUS
  Filled 2016-12-24: qty 2

## 2016-12-24 MED ORDER — ENOXAPARIN SODIUM 40 MG/0.4ML ~~LOC~~ SOLN: 40.0000 mg | SUBCUTANEOUS | Status: DC

## 2016-12-24 MED ORDER — LIDOCAINE HCL (PF) 1 % IJ SOLN
5.0000 mL | Freq: Once | INTRAMUSCULAR | Status: AC
  Administered 2016-12-24: 5 mL via INTRADERMAL
  Filled 2016-12-24: qty 5

## 2016-12-24 MED ORDER — DIPHENHYDRAMINE HCL 50 MG/ML IJ SOLN
25.0000 mg | Freq: Once | INTRAMUSCULAR | Status: AC
  Administered 2016-12-24: 25 mg via INTRAVENOUS
  Filled 2016-12-24: qty 1

## 2016-12-24 MED ORDER — DEXTROSE 5 % IV SOLN
10.0000 mg/kg | Freq: Three times a day (TID) | INTRAVENOUS | Status: DC
  Administered 2016-12-24 – 2016-12-25 (×3): 545 mg via INTRAVENOUS
  Filled 2016-12-24 (×5): qty 10.9

## 2016-12-24 MED ORDER — SODIUM CHLORIDE 0.9 % IV BOLUS (SEPSIS)
1000.0000 mL | Freq: Once | INTRAVENOUS | Status: AC
  Administered 2016-12-24: 1000 mL via INTRAVENOUS

## 2016-12-24 MED ORDER — KETOROLAC TROMETHAMINE 15 MG/ML IJ SOLN
15.0000 mg | Freq: Once | INTRAMUSCULAR | Status: AC
  Administered 2016-12-24: 15 mg via INTRAVENOUS
  Filled 2016-12-24: qty 1

## 2016-12-24 MED ORDER — PROMETHAZINE HCL 25 MG/ML IJ SOLN
12.5000 mg | Freq: Once | INTRAMUSCULAR | Status: AC
Start: 2016-12-24 — End: 2016-12-24
  Administered 2016-12-24: 12.5 mg via INTRAVENOUS
  Filled 2016-12-24: qty 1

## 2016-12-24 MED ORDER — DEXTROSE 5 % IV SOLN
2.0000 g | Freq: Two times a day (BID) | INTRAVENOUS | Status: DC
  Administered 2016-12-24 – 2016-12-25 (×2): 2 g via INTRAVENOUS
  Filled 2016-12-24 (×3): qty 2

## 2016-12-24 MED ORDER — GADOBENATE DIMEGLUMINE 529 MG/ML IV SOLN
10.0000 mL | Freq: Once | INTRAVENOUS | Status: AC | PRN
  Administered 2016-12-24: 10 mL via INTRAVENOUS

## 2016-12-24 NOTE — Progress Notes (Signed)
Pharmacy Antibiotic Note  Grace Bishop is a 31 y.o. female admitted on 12/24/2016 with acute encephalopathy. Unclear etiology. [facility] .  Pharmacy has been consulted for Acyclovir dosing for encephalitis and Vancomycin/ Ceftriaxone dosing for meningitis. She received Ceftriaxone 2g IV x1 @ 11:00AM and Vancomycin 1000 mg IV @ 11:51AM [facility].   Vancomycin goal = 15-20 mcg/ml  Past Medical History:  Diagnosis Date  . Anxiety   . Lupus    joint pain  . Migraines   . Ovarian cyst   . Thyroid disease     Plan: Acyclovir 10 mg/kg IV q8h Ceftriaxone 2 gm IV q12h Vancomycin 500 mg IV q8h Check vancomycin trough at steady state. F/u renal function, culture results  Height: 5\' 4"  (162.6 cm) Weight: 120 lb (54.4 kg) IBW/kg (Calculated) : 54.7  Temp (24hrs), Avg:99.6 F (37.6 C), Min:98.3 F (36.8 C), Max:100.8 F (38.2 C)   Recent Labs Lab 12/23/16 2319 12/24/16 0019 12/24/16 0805  WBC 5.6  --  3.8*  CREATININE  --  0.54 0.51    Estimated Creatinine Clearance: 87.5 mL/min (by C-G formula based on SCr of 0.51 mg/dL).    No Known Allergies  Antimicrobials this admission: 2/8 Ceftriaxone>> 2/8 Vancomycin>> 2/8 Acyclovir>>  Dose adjustments this admission:   Microbiology results:    Thank you for allowing pharmacy to be a part of this patient's care. Noah Delaineuth Natiya Seelinger, RPh Clinical Pharmacist Pager: [phone] 12/24/2016 8:15 PM

## 2016-12-24 NOTE — H&P (Signed)
History and Physical    [name] SAY:[id number] DOB: [birth year] DOA: 12/24/2016  PCP: Kathrynn Running, [place] Consultants:  Neurologist; Rheumatologist Patient coming from: home - lives with fiance; NOK: fiance, [phone]  Chief Complaint: altered mental status  HPI: Grace Bishop is a 31 y.o. female with medical history significant of hypothyroidism, migraines, anxiety, and lupus presenting with AMS.  History was obtained entirely by her fiance because the patient is unable to provide any history or answer questions.  Yesterday during the day she was fine, normal.  Aoubt 730 pm, she was coming from the tax place and her fiance noticed that she was not really able to communicate to him.  Slow and intentional speech.  About 30 minutes later, took her medications - Balcofen, Promethazine, magnesium - because she felt a migraine was coming on.  About 5 minutes later, she asked him to take her to the ER.  She had difficulty understanding what the doctor was telling her, so they were uncomfortable giving her the usual migraine cocktail due to concern for oversedation.  So they gave her IVF and discharged her home about 2am.  Throughout the next few hours, she tossed and turned and called out her fiance's name randomly.  Needed significant assistance with hygiene.  Refused to do things like wipe her mouth, put on her shirt.  He got up this AM for work and asked her questions - her speech still wasn't good and she claimed she still had a headache or migraine. Took her back to [facility].  They called him about 1115 at work to report no improvement, just sleeping all day.  One time he got a response of "no, I'm fine" but also later she reported "No, my head is hurting."  Uncertain if she understands the questions asked.  Sometimes recognizes her fiance, asks him to do random and inappropriate things.  She was, however, able to stand and walk today.  This is out of character for her BUT "we've had similar  things to this but never to this extreme."  H/o migraine headache, difficulty with vision, speech very broken, unable to walk or stand - thought to be stroke but decided it was a complex migraine.  Temp to 99-103 at [facility] "but that's normal too, we've had those low-grade fevers.  Sometimes they're attached to the migraine and sometimes they're not.  She did have significant nausea this week.  2 weeks ago she was diagnosed with pneumonia, went back to work earlier than she should have and she was sent back home.  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  She has had frequent ER visits since 12/17 including:  12/29 - migraine, treated with Benadryl, Compazine, IVF, and Toradol  1/7 - migraine - treated with IV fluids and medications  1/21 - CAP, treated with Augmentin and Azithromycin  1/23 - improved PNA, migraine treated with fluids and a headache cocktail   Has reported h/o SLE but is inconsistent in taking her Plaquenil. H/o thyroid storm in 10/2012 Last ESR 55 in 7/17 H/o hemiplegic migraine x 2 Had Echo on 12/16/16   ED Course: Per Dr. Rogene [place]: Patient with an acute onset of headache and altered mental status yesterday  evening at 8. Evaluate here in the emergency department discharged home at 2 in the morning. According to patient's friend of went home as she arrived today. Still confused. Patient has a history of migraines which occasionally resulting confusion but not all the time but usually does not last this long. Workup last evening was predominantly geared at the altered mental status and concern for her taking too much of her baclofen.  Here with patient really without altered mental status moving everything  spontaneously occasionally verbalizing but not able to follow commands or answer questions appropriately. Patient had head CT chest x-ray negative. Then she repeated labs without any significant change from last evening. Will add on a urinalysis. Due to the finding of a low-grade fever patient was started on Rocephin and vancomycin for the possibility of meningitis.  MRI not available here. Attempted lumbar puncture however patient not very cooperative, 4 attempts failed.  Discussed with neural hospitalist. They are aware of her will arrange hospitalist admission. Patient will need MRI and eventually will need a lumbar puncture. Could be difficult even under fluoroscopy.  Patient showed no signs of improvement besides receiving a migraine cocktail here. Patient also given Ativan. Patient not worse by all signs normal.  Discussed with the hospitalist they will mid to telemetry bed at [facility]. Patient will need MRI and lumbar puncture. Patient remained stable. At the request of added a sedimentation rate and C-reactive protein and an outcast for urinalysis.    Review of Systems:  Unable to obtain Ambulatory Status:  Fully independent  Past Medical History:  Diagnosis Date  . Anxiety   . Lupus    joint pain  . Migraines   . Ovarian cyst   . Thyroid disease     Past Surgical History:  Procedure Laterality Date  . OVARIAN CYST REMOVAL    . TONSILLECTOMY      Social History   Social History  . Marital status: Single    Spouse name: N/A  . Number of children: N/A  . Years of education: N/A   Occupational History  . 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 Smoking status: Former Smoker    Years: 2.00  . Smokeless tobacco: Never Used  . Alcohol use No  . Drug use: No  . Sexual activity: Not on file   Other Topics Concern  . Not on file   Social History Narrative  . No narrative on file    No Known Allergies  Family History  Problem Relation Age of Onset  .  Thyroid disease Mother   . Drug abuse Father   . Sickle cell trait Sister     Prior to Admission medications   Medication Sig Start Date End Date Taking? Authorizing Provider  baclofen (LIORESAL) 10 MG tablet Take 10 mg by mouth 3 (three) times daily.   Yes Historical Provider, MD  hydroxychloroquine (PLAQUENIL) 200 MG tablet Take 200 mg by mouth daily.   Yes Historical Provider, MD  naproxen (NAPROSYN) 500 MG tablet Take 500 mg by mouth 2 (two) times daily with a meal.   Yes Historical Provider, MD  promethazine (PHENERGAN) 25 MG tablet Take 25 mg by mouth every 6 (six) hours as needed for nausea or vomiting.   Yes Historical Provider, MD    Physical Exam: Vitals:   12/24/16 1430 12/24/16 1500 12/24/16 1827 12/24/16 2146  BP: 99/56 103/56 (!) 105/57 117/75  Pulse: 78 77 75 78  Resp: 23 (!)  33 (!) 21 16  Temp:   99.3 F (37.4 C) 99.8 F (37.7 C)  TempSrc:   Oral Oral  SpO2: 100% 99% 99% 99%  Weight:      Height:         General: Sleeping comfortably but very confused when awakened Eyes:  EOMI, normal lids, iris ENT:  grossly normal hearing, lips & tongue, mmm Neck:  no LAD, masses or thyromegaly Cardiovascular:  RRR, no m/r/g. No LE edema.  Respiratory:  CTA bilaterally, no w/r/r. Normal respiratory effort. Abdomen:  soft, ntnd, NABS Skin:  no rash or induration seen on limited exam Musculoskeletal:  grossly normal tone BUE/BLE, good ROM, no bony abnormality Psychiatric:  Perseverates, unable to answer any questions accurately Neurologic:  Unable to perform  Labs on Admission: I have personally reviewed following labs and imaging studies  CBC:  Recent Labs Lab 12/23/16 2319 12/24/16 0805  WBC 5.6 3.8*  NEUTROABS 4.2 3.0  HGB 11.5* 10.9*  HCT 34.1* 32.4*  MCV 83.0 82.7  PLT 222 546   Basic Metabolic Panel:  Recent Labs Lab 12/24/16 0019 12/24/16 0805  NA 134* 136  K 3.1* 3.7  CL 104 107  CO2 23 21*  GLUCOSE 164* 135*  BUN 9 8  CREATININE 0.54 0.51    CALCIUM 9.2 9.1   GFR: Estimated Creatinine Clearance: 87.5 mL/min (by C-G formula based on SCr of 0.51 mg/dL). Liver Function Tests:  Recent Labs Lab 12/24/16 0019  AST 27  ALT 24  ALKPHOS 42  BILITOT 0.5  PROT 8.3*  ALBUMIN 4.1   No results for input(s): LIPASE, AMYLASE in the last 168 hours. No results for input(s): AMMONIA in the last 168 hours. Coagulation Profile: No results for input(s): INR, PROTIME in the last 168 hours. Cardiac Enzymes: No results for input(s): CKTOTAL, CKMB, CKMBINDEX, TROPONINI in the last 168 hours. BNP (last 3 results) No results for input(s): PROBNP in the last 8760 hours. HbA1C: No results for input(s): HGBA1C in the last 72 hours. CBG:  Recent Labs Lab 12/24/16 0803  GLUCAP 128*   Lipid Profile: No results for input(s): CHOL, HDL, LDLCALC, TRIG, CHOLHDL, LDLDIRECT in the last 72 hours. Thyroid Function Tests: No results for input(s): TSH, T4TOTAL, FREET4, T3FREE, THYROIDAB in the last 72 hours. Anemia Panel: No results for input(s): VITAMINB12, FOLATE, FERRITIN, TIBC, IRON, RETICCTPCT in the last 72 hours. Urine analysis:    Component Value Date/Time   COLORURINE YELLOW 12/24/2016 [place] 12/24/2016 1545   LABSPEC 1.017 12/24/2016 1545   PHURINE 7.0 12/24/2016 1545   GLUCOSEU NEGATIVE 12/24/2016 1545   HGBUR NEGATIVE 12/24/2016 1545   BILIRUBINUR NEGATIVE 12/24/2016 1545   KETONESUR 40 (A) 12/24/2016 1545   PROTEINUR NEGATIVE 12/24/2016 1545   NITRITE NEGATIVE 12/24/2016 1545   LEUKOCYTESUR NEGATIVE 12/24/2016 1545    Creatinine Clearance: Estimated Creatinine Clearance: 87.5 mL/min (by C-G formula based on SCr of 0.51 mg/dL).  Sepsis Labs: _0 (procalcitonin:4,lacticidven:4) )No results found for this or any previous visit (from the past 240 hour(s)).   Radiological Exams on Admission: Dg Chest 2 View  Result Date: 12/24/2016 CLINICAL DATA:  Headache.  Recent pneumonia. EXAM: CHEST  2 VIEW  COMPARISON:  12/08/2016 FINDINGS: No focal consolidation today. There is mild interstitial coarsening without edema, effusion, or pneumothorax. Normal heart size and mediastinal contours. IMPRESSION: No definite acute disease.  No focal pneumonia. Electronically Signed   By: Monte Fantasia M.D.   On: 12/24/2016 08:30   Ct Head Wo  Contrast  Result Date: 12/24/2016 CLINICAL DATA:  New onset confusion, nausea, vomiting, diarrhea and headache, onset of symptoms yesterday at 2000 hours, history of migraines, recent pneumonia, lupus EXAM: CT HEAD WITHOUT CONTRAST TECHNIQUE: Contiguous axial images were obtained from the base of the skull through the vertex without intravenous contrast. COMPARISON:  12/23/2016 FINDINGS: Brain: Normal ventricular morphology. No midline shift or mass effect. Normal appearance of brain parenchyma. No intracranial hemorrhage, mass lesion, evidence of acute infarction, or extra-axial fluid collection. Vascular: Normal appearance Skull: Normal appearance Sinuses/Orbits: Clear Other: Scattered dural calcifications near vertex. IMPRESSION: Normal exam. Electronically Signed   By: Lavonia Dana M.D.   On: 12/24/2016 08:29   Ct Head Wo Contrast  Result Date: 12/24/2016 CLINICAL DATA:  31 y/o  F; headache. EXAM: CT HEAD WITHOUT CONTRAST TECHNIQUE: Contiguous axial images were obtained from the base of the skull through the vertex without intravenous contrast. COMPARISON:  None. FINDINGS: Brain: No evidence of acute infarction, hemorrhage, hydrocephalus, extra-axial collection or mass lesion/mass effect. Vascular: No hyperdense vessel or unexpected calcification. Skull: Normal. Negative for fracture or focal lesion. Sinuses/Orbits: No acute finding. Other: Numerous punctate calcifications within the parotid glands. IMPRESSION: 1. No acute intracranial abnormality identified. Unremarkable CT of the brain for age. 2. Parotid calcifications likely representing sequelae of prior infectious or  inflammatory process. Electronically Signed   By: Kristine Garbe M.D.   On: 12/24/2016 00:04   Mr Jeri Cos UU Contrast  Result Date: 12/24/2016 CLINICAL DATA:  Altered mental status.  Migraines. EXAM: MRI HEAD WITHOUT AND WITH CONTRAST TECHNIQUE: Multiplanar, multiecho pulse sequences of the brain and surrounding structures were obtained without and with intravenous contrast. CONTRAST:  28m MULTIHANCE GADOBENATE DIMEGLUMINE 529 MG/ML IV SOLN COMPARISON:  Head CT 12/24/2016 and 12/23/2016 FINDINGS: Brain: No focal diffusion restriction to indicate acute infarct. No intraparenchymal hemorrhage. The brain parenchymal signal is normal. No mass lesion or midline shift. No hydrocephalus or extra-axial fluid collection. The midline structures are normal. No age advanced or lobar predominant atrophy. Vascular: Major intracranial arterial and venous sinus flow voids are preserved. No evidence of chronic microhemorrhage or amyloid angiopathy. Skull and upper cervical spine: The visualized skull base, calvarium, upper cervical spine and extracranial soft tissues are normal. Sinuses/Orbits: No fluid levels or advanced mucosal thickening. No mastoid effusion. Normal orbits. IMPRESSION: Normal brain MRI. Electronically Signed   By: KUlyses JarredM.D.   On: 12/24/2016 21:17    EKG: Not done  Assessment/Plan Principal Problem:   Encephalopathy   -Patient with h/o complex migraines presenting with AMS -CT/MRI negative -Was treated with Vanc/Rocephin empirically for meningitis since 4 initial LPs were unsuccessful -[facility] -Most likely ddx appears to be: viral meningitis/encephalitis; atypical/complex migraine; and psychogenic disorder. -Her MRI essentially ruled out CVA. -Glucose 164, 135 - while marginally elevated, this is not nearly high enough to cause this level of encephalopathy. -CRP <0.8 and ESR 55 (same as prior), so does not appear  to be lupus cerebritis. -Hgb 10.9, her usual is 10-11 -Tylenol negative -Salicylate negative -ETOH negative -UA negative -1/7 upreg negative -1/7 UDS negative -For now, awaiting neurology consult -Will continue empiric treatment for meningitis while awaiting LP under fluoro (spoke with radiology, it is reasonable to wait until the AM since she has already started treatment) -Continue to monitor, as this may resolve spontaneously at some point -Would avoid sedating medications including opiates/BZD unless truly necessary    DVT prophylaxis: Lovenox  Code  Status: Full  Family Communication: Fiance present throughout evaluation Disposition Plan:  Home once clinically improved Consults called: Neurology  Admission status: Admit - It is my clinical opinion that admission to INPATIENT is reasonable and necessary because this patient will require at least 2 midnights in the hospital to treat this condition based on the medical complexity of the problems presented.  Given the aforementioned information, the predictability of an adverse outcome is felt to be significant.    Karmen Bongo MD [facility]  If 7PM-7AM, please contact night-coverage [email or web address] Password [email or web address]  12/25/2016, 12:50 AM

## 2016-12-24 NOTE — ED Provider Notes (Addendum)
[facility] Provider Note   CSN: [id number] Arrival date & time: 12/24/16  0700     History   Chief Complaint Chief Complaint  Patient presents with  . Altered Mental Status    HPI Grace Bishop is a 31 y.o. female.  History obtained entirely by the patient's fianc. Patient did work yesterday 6:57 PM got her taxes arranged on the way home by arrival at home time and 8 was complaining of a headache and had confusion and some speech problems. According to him this occasionally is happening with her migraines but does not happen every time. Patient was evaluated here last evening clearly had altered mental status was discharged home at 2 in the morning. Workup was negative mostly geared towards altered mental status. Patient did not have head CT your chest x-ray. But labs were normal including urine drug screen. They were feeling at that time it had something to do with overmedication. Patient takes baclofen for her migraines. Patient awoke this morning still no better still confused or fianc brought her in again. Patient still not able to follow commands occasionally verbalizes normally. Occasionally will recognized yet had pain yes but then can't describe it or or locate the pain. No other symptoms and apparently patient was well. Patient usually has some nausea and vomiting with her migraines.      Past Medical History:  Diagnosis Date  . Anxiety   . Lupus   . Migraines   . Ovarian cyst     There are no active problems to display for this patient.   Past Surgical History:  Procedure Laterality Date  . OVARIAN CYST REMOVAL    . TONSILLECTOMY      OB History    No data available       Home Medications    Prior to Admission medications   Medication Sig Start Date End Date Taking? Authorizing Provider  baclofen (LIORESAL) 10 MG tablet Take 10 mg by mouth 3 (three) times daily.   Yes Historical Provider, MD  hydroxychloroquine (PLAQUENIL) 200 MG tablet  Take 200 mg by mouth daily.   Yes Historical Provider, MD  naproxen (NAPROSYN) 500 MG tablet Take 500 mg by mouth 2 (two) times daily with a meal.   Yes Historical Provider, MD  promethazine (PHENERGAN) 25 MG tablet Take 25 mg by mouth every 6 (six) hours as needed for nausea or vomiting.   Yes Historical Provider, MD    Family History No family history on file.  Social History Social History  Substance Use Topics  . Smoking status: Never Smoker  . Smokeless tobacco: Never Used  . Alcohol use No     Allergies   Patient has no known allergies.   Review of Systems Review of Systems  Unable to perform ROS: Mental status change  Neurological: Positive for headaches.  Psychiatric/Behavioral: Positive for confusion.     Physical Exam Updated Vital Signs BP 99/56   Pulse 78   Temp 99.9 F (37.7 C) (Oral)   Resp 23   Ht 5\' 4"  (1.626 m)   Wt 54.4 kg   LMP 12/18/2016   SpO2 100%   BMI 20.60 kg/m   Physical Exam  Constitutional: She appears well-developed and well-nourished. No distress.  Does not appear in any distress.  HENT:  Head: Normocephalic and atraumatic.  Eyes: EOM are normal. Pupils are equal, round, and reactive to light.  Cardiovascular: Normal rate, regular rhythm and normal heart sounds.   Pulmonary/Chest: Effort normal and  breath sounds normal. No respiratory distress.  Abdominal: Soft. Bowel sounds are normal. There is no tenderness.  Musculoskeletal: Normal range of motion. She exhibits no edema.  Neurological: She is alert.  Patient confused not following commands. Does move everything spontaneously will verbalize some day times does not make sense.  Skin: Skin is warm. No rash noted.  Nursing note and vitals reviewed.    ED Treatments / Results  Labs (all labs ordered are listed, but only abnormal results are displayed) Labs Reviewed  CBC WITH DIFFERENTIAL/PLATELET - Abnormal; Notable for the following:       Result Value   WBC 3.8 (*)     Hemoglobin 10.9 (*)    HCT 32.4 (*)    Lymphs Abs 0.4 (*)    All other components within normal limits  BASIC METABOLIC PANEL - Abnormal; Notable for the following:    CO2 21 (*)    Glucose, Bld 135 (*)    All other components within normal limits  CBG MONITORING, ED - Abnormal; Notable for the following:    Glucose-Capillary 128 (*)    All other components within normal limits   Results for orders placed or performed during the hospital encounter of 12/24/16  CBC with Differential/Platelet  Result Value Ref Range   WBC 3.8 (L) 4.0 - 10.5 K/uL   RBC 3.92 3.87 - 5.11 MIL/uL   Hemoglobin 10.9 (L) 12.0 - 15.0 g/dL   HCT 81.1 (L) 91.4 - 78.2 %   MCV 82.7 78.0 - 100.0 fL   MCH 27.8 26.0 - 34.0 pg   MCHC 33.6 30.0 - 36.0 g/dL   RDW 95.6 21.3 - 08.6 %   Platelets 201 150 - 400 K/uL   Neutrophils Relative % 80 %   Neutro Abs 3.0 1.7 - 7.7 K/uL   Lymphocytes Relative 11 %   Lymphs Abs 0.4 (L) 0.7 - 4.0 K/uL   Monocytes Relative 9 %   Monocytes Absolute 0.3 0.1 - 1.0 K/uL   Eosinophils Relative 0 %   Eosinophils Absolute 0.0 0.0 - 0.7 K/uL   Basophils Relative 0 %   Basophils Absolute 0.0 0.0 - 0.1 K/uL  Basic metabolic panel  Result Value Ref Range   Sodium 136 135 - 145 mmol/L   Potassium 3.7 3.5 - 5.1 mmol/L   Chloride 107 101 - 111 mmol/L   CO2 21 (L) 22 - 32 mmol/L   Glucose, Bld 135 (H) 65 - 99 mg/dL   BUN 8 6 - 20 mg/dL   Creatinine, Ser 5.78 0.44 - 1.00 mg/dL   Calcium 9.1 8.9 - 46.9 mg/dL   GFR calc non Af Amer >60 >60 mL/min   GFR calc Af Amer >60 >60 mL/min   Anion gap 8 5 - 15  CBG monitoring, ED  Result Value Ref Range   Glucose-Capillary 128 (H) 65 - 99 mg/dL     EKG  EKG Interpretation None       Radiology Dg Chest 2 View  Result Date: 12/24/2016 CLINICAL DATA:  Headache.  Recent pneumonia. EXAM: CHEST  2 VIEW COMPARISON:  12/08/2016 FINDINGS: No focal consolidation today. There is mild interstitial coarsening without edema, effusion, or  pneumothorax. Normal heart size and mediastinal contours. IMPRESSION: No definite acute disease.  No focal pneumonia. Electronically Signed   By: Marnee Spring M.D.   On: 12/24/2016 08:30   Ct Head Wo Contrast  Result Date: 12/24/2016 CLINICAL DATA:  New onset confusion, nausea, vomiting, diarrhea and headache, onset of  symptoms yesterday at 2000 hours, history of migraines, recent pneumonia, lupus EXAM: CT HEAD WITHOUT CONTRAST TECHNIQUE: Contiguous axial images were obtained from the base of the skull through the vertex without intravenous contrast. COMPARISON:  12/23/2016 FINDINGS: Brain: Normal ventricular morphology. No midline shift or mass effect. Normal appearance of brain parenchyma. No intracranial hemorrhage, mass lesion, evidence of acute infarction, or extra-axial fluid collection. Vascular: Normal appearance Skull: Normal appearance Sinuses/Orbits: Clear Other: Scattered dural calcifications near vertex. IMPRESSION: Normal exam. Electronically Signed   By: Ulyses Southward M.D.   On: 12/24/2016 08:29   Ct Head Wo Contrast  Result Date: 12/24/2016 CLINICAL DATA:  31 y/o  F; headache. EXAM: CT HEAD WITHOUT CONTRAST TECHNIQUE: Contiguous axial images were obtained from the base of the skull through the vertex without intravenous contrast. COMPARISON:  None. FINDINGS: Brain: No evidence of acute infarction, hemorrhage, hydrocephalus, extra-axial collection or mass lesion/mass effect. Vascular: No hyperdense vessel or unexpected calcification. Skull: Normal. Negative for fracture or focal lesion. Sinuses/Orbits: No acute finding. Other: Numerous punctate calcifications within the parotid glands. IMPRESSION: 1. No acute intracranial abnormality identified. Unremarkable CT of the brain for age. 2. Parotid calcifications likely representing sequelae of prior infectious or inflammatory process. Electronically Signed   By: Mitzi Hansen M.D.   On: 12/24/2016 00:04    Procedures Procedures  (including critical care time)  Patient prepped and draped and anesthetized with 1% lidocaine 2 mL used. Landmarks identified. Attempted lumbar puncture with 2 nurses holding her 4 without success. Unable to get into the space. Patient very wiggly.  Medications Ordered in ED Medications  0.9 %  sodium chloride infusion ( Intravenous New Bag/Given 12/24/16 0920)  sodium chloride 0.9 % bolus 1,000 mL (0 mLs Intravenous Stopped 12/24/16 0919)  dexamethasone (DECADRON) injection 10 mg (10 mg Intravenous Given 12/24/16 0827)  promethazine (PHENERGAN) injection 12.5 mg (12.5 mg Intravenous Given 12/24/16 0826)  diphenhydrAMINE (BENADRYL) injection 25 mg (25 mg Intravenous Given 12/24/16 0822)  ketorolac (TORADOL) 15 MG/ML injection 15 mg (15 mg Intravenous Given 12/24/16 0856)  LORazepam (ATIVAN) injection 1 mg (1 mg Intravenous Given 12/24/16 1026)  cefTRIAXone (ROCEPHIN) 2 g in dextrose 5 % 50 mL IVPB (0 g Intravenous Stopped 12/24/16 1151)  vancomycin (VANCOCIN) IVPB 1000 mg/200 mL premix (0 mg Intravenous Stopped 12/24/16 1251)  lidocaine (PF) (XYLOCAINE) 1 % injection 5 mL (5 mLs Intradermal Given 12/24/16 1415)     Initial Impression / Assessment and Plan / ED Course  I have reviewed the triage vital signs and the nursing notes.  Pertinent labs & imaging results that were available during my care of the patient were reviewed by me and considered in my medical decision making (see chart for details).     Patient with an acute onset of headache and altered mental status yesterday evening at 8. Evaluate  here in the emergency department discharged home at 2 in the morning. According to patient's friend of went home as she arrived today. Still confused. Patient has a history of migraines which occasionally resulting confusion but not all the time but usually does not last this long. Workup last evening was predominantly geared at the altered mental status and concern for her taking too much of her  baclofen.  Here with patient really without altered mental status moving everything spontaneously occasionally verbalizing but not able to follow commands or answer questions appropriately. Patient had head CT chest x-ray negative. Then she repeated labs without any significant change from last evening. Will add on  a urinalysis. Due to the finding of a low-grade fever patient was started on Rocephin and vancomycin for the possibility of meningitis.  MRI not available here. Attempted lumbar puncture however patient not very cooperative,  4 attempts failed.  Discussed with neural hospitalist. They are aware of her will arrange hospitalist admission. Patient will need MRI and eventually will need a lumbar puncture. Could be difficult even under fluoroscopy.  Patient showed no signs of improvement besides receiving a migraine cocktail here. Patient also given Ativan. Patient not worse by all signs normal.  Discussed with the hospitalist they will mid to telemetry bed at [facility]. Patient will need MRI and lumbar puncture. Patient remained stable. At the request of added a sedimentation rate and C-reactive protein and an outcast for urinalysis.  Final Clinical Impressions(s) / ED Diagnoses   Final diagnoses:  Altered mental status, unspecified altered mental status type  Acute intractable headache, unspecified headache type    New Prescriptions New Prescriptions   No medications on file     Vanetta Mulders, MD 12/24/16 1502    Vanetta Mulders, MD 12/24/16 [phone]

## 2016-12-24 NOTE — ED Notes (Signed)
Did not have to in and out cath pt.

## 2016-12-24 NOTE — Progress Notes (Signed)
   Patient coming from [facility] after evaluation on subsequent days for acute encephalopathy. Unclear etiology. LP attempted multiple times without success. No clear source found at this time. UA, ESR, CRP pending. Patient is febrile but otherwise stable from a vital sign standpoint. Patient started on vancomycin and Rocephin. Neurology consultation and will see the patient at [facility]. MRI will need to be obtained urgently when patient arrives at [facility]. No change in symptoms after treatment with a migraine cocktail which typically works for this patient.  Linna Darner, MD [facility] 12/24/2016, 3:23 PM

## 2016-12-24 NOTE — ED Notes (Signed)
Pt fiance here. EDP aware.

## 2016-12-24 NOTE — ED Triage Notes (Signed)
C/o n/v/d and c/o h/a Was seen here and d/ced at 0200 today. Sx started at 2000 yesterday. Pt has had migraine in past and has had these sx before with them but they have never lasted this long. Pupils 3 and perrl. Will hold my hands but will not squeeze tight. Pen on bottom of feet have reflex, but pt does not put with feet when asked. Fiance at bedside.

## 2016-12-24 NOTE — ED Notes (Signed)
Husband left phone # [phone], states he has to go to work and call him if needed.

## 2016-12-24 NOTE — ED Notes (Signed)
Family at bedside. 

## 2016-12-24 NOTE — Consult Note (Signed)
NEURO HOSPITALIST CONSULT NOTE   Requestig physician: Dr. Lorin Mercy  Reason for Consult: Headache, fever and AMS  History obtained from:   Husband and Chart     HPI:                                                                                                                                          Grace Bishop is an 31 y.o. female with a PMHx of lupus, migraines, anxiety and ovarian cyst, who presented initially to OSH with severe headache. She has had migraines in the past, along with the current symptoms, but they have never lasted this long.   Her symptoms began at about 8 PM on Wednesday. Her husband came home and she was complaining of a headache; she seemed confused and had some speech problems. He noted that this happens with her migraines on occasion. She was seen at the [facility] ED where basic work up was negative, and discharged at 0200 on Thursday. The thought at that visit was that her altered mental status had something to do with overmedication. Baclofen is one of her migraine medications, in addition to promethazine and magnesium. She had described to them that she has had migraines in the past, but symptoms this time were atypical.   She then represented to the [facility] ED a few hours later after waking up still confused and with continued headache. Notes from the second visit document inability to follow commands, but occasional ability to verbalize normally. She was unable to describe her symptoms with significant level of detail. LP was attempted multiple times without success. She was febrile but otherwise stable. She was started on Vancomycin and Rocephin. She was transferred to [facility] for MRI and Neurology evaluation.    Her PMHx includes lupus with joint pain, ovarian cyst, thyroid disease, anxiety and migraines.   Past Medical History:  Diagnosis Date  . Anxiety   . Lupus    joint pain  . Migraines   . Ovarian cyst   . Thyroid disease     Past Surgical  History:  Procedure Laterality Date  . OVARIAN CYST REMOVAL    . TONSILLECTOMY      Family History  Problem Relation Age of Onset  . Thyroid disease Mother   . Drug abuse Father   . Sickle cell trait Sister     Social History:  reports that she has quit smoking. She quit after 2.00 years of use. She has never used smokeless tobacco. She reports that she does not drink alcohol or use drugs.  No Known Allergies  MEDICATIONS:  Scheduled: . acyclovir  10 mg/kg Intravenous Q8H  . cefTRIAXone (ROCEPHIN)  IV  2 g Intravenous Q12H  . enoxaparin (LOVENOX) injection  40 mg Subcutaneous Q24H  . hydroxychloroquine  200 mg Oral Daily  . vancomycin  500 mg Intravenous Q8H     ROS:                                                                                                                                       As per husband, as documented in HPI. Patient unable to provide ROS due to AMS.   Blood pressure (!) 105/57, pulse 75, temperature 99.3 F (37.4 C), temperature source Oral, resp. rate (!) 21, height 5' 4" (1.626 m), weight 54.4 kg (120 lb), last menstrual period 12/18/2016, SpO2 99 %.   General Examination:                                                                                                      HEENT-  Normocephalic/atraumatic.   Lungs- Respirations unlabored. No gross wheezing.  Extremities- No edema.   Neurological Examination Mental Status: Awake. Gives full-sentence replies to questions that are fluent with intact grammar and syntax, but unrelated to the questions and often repeated perseveratively. Does not give short yes/no answers to orientation questions. Poor eye contact. Does not fixate on visual stimuli but appears to have normal vision when placing covers back on after exam. No dysarthria noted.  Cranial Nerves: II: Reacts to visual stimuli  bilaterally but does not fixate upon or attend to them. PERRL.  III,IV, VI: ptosis not present, extra-ocular motions intact bilaterally. No nystagmus noted V,VII: Subtle right facial droop. Reacts to tactile stimulation bilaterally. VIII: Responds to auditory stimuli IX,X: No hypophonia or hoarseness XI: No gross asymmetry noted XII: Does not follow command Motor: Moves all 4 extremities spontaneously and symmetrically when adjusting covers in bed. Gave modest effort to command with leg extension bilaterally, which was the only command followed.  Sensory: Reacts to tactile stimuli x 4 Deep Tendon Reflexes: 1+ patellae, achilles and brachioradialis bilaterally.  Plantars: Right: downgoing   Left: downgoing Cerebellar: No gross ataxia noted with spontaneous movements. Unable to formally assess.  Gait: Deferred.   Lab Results: Basic Metabolic Panel:  Recent Labs Lab 12/24/16 0019 12/24/16 0805  NA 134* 136  K 3.1* 3.7  CL 104 107  CO2 23 21*  GLUCOSE 164* 135*  BUN 9 8  CREATININE 0.54 0.51  CALCIUM 9.2 9.1  Liver Function Tests:  Recent Labs Lab 12/24/16 0019  AST 27  ALT 24  ALKPHOS 42  BILITOT 0.5  PROT 8.3*  ALBUMIN 4.1   No results for input(s): LIPASE, AMYLASE in the last 168 hours. No results for input(s): AMMONIA in the last 168 hours.  CBC:  Recent Labs Lab 12/23/16 2319 12/24/16 0805  WBC 5.6 3.8*  NEUTROABS 4.2 3.0  HGB 11.5* 10.9*  HCT 34.1* 32.4*  MCV 83.0 82.7  PLT 222 201    Cardiac Enzymes: No results for input(s): CKTOTAL, CKMB, CKMBINDEX, TROPONINI in the last 168 hours.  Lipid Panel: No results for input(s): CHOL, TRIG, HDL, CHOLHDL, VLDL, LDLCALC in the last 168 hours.  CBG:  Recent Labs Lab 12/24/16 0803  GLUCAP 128*    Microbiology: No results found for this or any previous visit.  Coagulation Studies: No results for input(s): LABPROT, INR in the last 72 hours.  Imaging: Dg Chest 2 View  Result Date:  12/24/2016 CLINICAL DATA:  Headache.  Recent pneumonia. EXAM: CHEST  2 VIEW COMPARISON:  12/08/2016 FINDINGS: No focal consolidation today. There is mild interstitial coarsening without edema, effusion, or pneumothorax. Normal heart size and mediastinal contours. IMPRESSION: No definite acute disease.  No focal pneumonia. Electronically Signed   By: Monte Fantasia M.D.   On: 12/24/2016 08:30   Ct Head Wo Contrast  Result Date: 12/24/2016 CLINICAL DATA:  New onset confusion, nausea, vomiting, diarrhea and headache, onset of symptoms yesterday at 2000 hours, history of migraines, recent pneumonia, lupus EXAM: CT HEAD WITHOUT CONTRAST TECHNIQUE: Contiguous axial images were obtained from the base of the skull through the vertex without intravenous contrast. COMPARISON:  12/23/2016 FINDINGS: Brain: Normal ventricular morphology. No midline shift or mass effect. Normal appearance of brain parenchyma. No intracranial hemorrhage, mass lesion, evidence of acute infarction, or extra-axial fluid collection. Vascular: Normal appearance Skull: Normal appearance Sinuses/Orbits: Clear Other: Scattered dural calcifications near vertex. IMPRESSION: Normal exam. Electronically Signed   By: Lavonia Dana M.D.   On: 12/24/2016 08:29   Ct Head Wo Contrast  Result Date: 12/24/2016 CLINICAL DATA:  31 y/o  F; headache. EXAM: CT HEAD WITHOUT CONTRAST TECHNIQUE: Contiguous axial images were obtained from the base of the skull through the vertex without intravenous contrast. COMPARISON:  None. FINDINGS: Brain: No evidence of acute infarction, hemorrhage, hydrocephalus, extra-axial collection or mass lesion/mass effect. Vascular: No hyperdense vessel or unexpected calcification. Skull: Normal. Negative for fracture or focal lesion. Sinuses/Orbits: No acute finding. Other: Numerous punctate calcifications within the parotid glands. IMPRESSION: 1. No acute intracranial abnormality identified. Unremarkable CT of the brain for age. 2. Parotid  calcifications likely representing sequelae of prior infectious or inflammatory process. Electronically Signed   By: Kristine Garbe M.D.   On: 12/24/2016 00:04    Assessment: 31 year old female with severe intractable headache and AMS. 1. Exam reveals findings suggestive of delirium versus a dissociative psychiatric disturbance. Her speech pattern is not consistent with a receptive or an expressive aphasia. DDX includes CNS lupus, other vasculitis or autoimmune encephalopathy (Hashimoto encephalopathy), unusual stroke presentation, unusual venous sinus thrombosis presentation and atypical presentation of complicated migraine.  2. Subclinical seizure is possible, but low on the differential diagnosis.  3. If work up for the above is negative, consider psychiatric diagnosis. She has been under possible stress due to work-related issues. 4. ESR is elevated at 55. 5. Tox screen negative. EtOH level < 5.   6. Subtle right facial droop may be her baseline or  may be related to a central lesion.   Recommendations: 1. Lumbar puncture under fluoroscopy. Will need opening pressure, oligoclonal bands, IgG level, albumin level, cell count with differential, protein, glucose, cryptococcal antigen, HSV PCR, VZV PCR, bacterial and fungal cultures.  2. MRI brain with and without contrast.  3. MRV. 4. CTA of head to evaluate for possible vasculitic lesions.  5. EEG. 6. Thiamine level, B12 level, TSH, C-reactive protein, ANA, C-ANCA, P-ANCA, ammonia, AST, ALT.  7. If above tests are negative, consider obtaining the following send out labs: anti-NMDA receptor Ab, anti-voltage gated potassium channel Ab, anti-thyroperoxidase Ab, anti-thyroglobulin Ab.  8. Continue empiric meningitis dose antibiotics: Ceftriaxone, vancomycin, acyclovir   Electronically signed: Dr. Kerney Elbe 12/24/2016, 7:40 PM

## 2016-12-24 NOTE — ED Notes (Signed)
Fiance called and asked to come back in per Dr Deretha EmoryZackowski.

## 2016-12-25 ENCOUNTER — Inpatient Hospital Stay (HOSPITAL_COMMUNITY): Payer: BLUE CROSS/BLUE SHIELD

## 2016-12-25 DIAGNOSIS — R4182 Altered mental status, unspecified: Secondary | ICD-10-CM

## 2016-12-25 DIAGNOSIS — R519 Headache, unspecified: Secondary | ICD-10-CM

## 2016-12-25 DIAGNOSIS — R51 Headache: Secondary | ICD-10-CM

## 2016-12-25 LAB — BASIC METABOLIC PANEL
ANION GAP: 8 (ref 5–15)
BUN: 6 mg/dL (ref 6–20)
CALCIUM: 8.5 mg/dL — AB (ref 8.9–10.3)
CO2: 20 mmol/L — ABNORMAL LOW (ref 22–32)
Chloride: 109 mmol/L (ref 101–111)
Creatinine, Ser: 0.57 mg/dL (ref 0.44–1.00)
GFR calc Af Amer: >60 mL/min (ref >60)
GLUCOSE: 96 mg/dL (ref 65–99)
POTASSIUM: 3.4 mmol/L — AB (ref 3.5–5.1)
SODIUM: 137 mmol/L (ref 135–145)

## 2016-12-25 LAB — RAPID URINE DRUG SCREEN, HOSP PERFORMED
AMPHETAMINES: NOT DETECTED
BARBITURATES: NOT DETECTED
Benzodiazepines: NOT DETECTED
Cocaine: NOT DETECTED
Opiates: NOT DETECTED
TETRAHYDROCANNABINOL: NOT DETECTED

## 2016-12-25 LAB — CSF CELL COUNT WITH DIFFERENTIAL
RBC Count, CSF: 0 /mm3
Tube #: 1
WBC, CSF: 4 /mm3 (ref 0–5)

## 2016-12-25 LAB — CBC
HEMATOCRIT: 28 % — AB (ref 36.0–46.0)
HEMOGLOBIN: 9.1 g/dL — AB (ref 12.0–15.0)
MCH: 26.7 pg (ref 26.0–34.0)
MCHC: 32.5 g/dL (ref 30.0–36.0)
MCV: 82.1 fL (ref 78.0–100.0)
Platelets: 171 10*3/uL (ref 150–400)
RBC: 3.41 MIL/uL — ABNORMAL LOW (ref 3.87–5.11)
RDW: 12.6 % (ref 11.5–15.5)
WBC: 4.3 10*3/uL (ref 4.0–10.5)

## 2016-12-25 LAB — GLUCOSE, CSF: GLUCOSE CSF: 46 mg/dL (ref 40–70)

## 2016-12-25 LAB — PROTEIN, CSF: TOTAL PROTEIN, CSF: 26 mg/dL (ref 15–45)

## 2016-12-25 MED ORDER — BOOST / RESOURCE BREEZE PO LIQD
1.0000 | Freq: Three times a day (TID) | ORAL | Status: DC
  Administered 2016-12-25 – 2016-12-26 (×3): 1 via ORAL
  Filled 2016-12-25 (×6): qty 1

## 2016-12-25 MED ORDER — KETOROLAC TROMETHAMINE 30 MG/ML IJ SOLN
60.0000 mg | Freq: Once | INTRAMUSCULAR | Status: AC
  Administered 2016-12-25: 60 mg via INTRAVENOUS
  Filled 2016-12-25: qty 2

## 2016-12-25 MED ORDER — LIDOCAINE HCL (PF) 1 % IJ SOLN
5.0000 mL | Freq: Once | INTRAMUSCULAR | Status: AC
  Administered 2016-12-25: 5 mL via INTRADERMAL

## 2016-12-25 MED ORDER — METOCLOPRAMIDE HCL 5 MG/ML IJ SOLN
10.0000 mg | Freq: Once | INTRAMUSCULAR | Status: AC
  Administered 2016-12-25: 10 mg via INTRAVENOUS
  Filled 2016-12-25: qty 2

## 2016-12-25 MED ORDER — BACLOFEN 10 MG PO TABS
10.0000 mg | ORAL_TABLET | Freq: Three times a day (TID) | ORAL | Status: DC
  Administered 2016-12-25 – 2016-12-26 (×3): 10 mg via ORAL
  Filled 2016-12-25 (×3): qty 1

## 2016-12-25 NOTE — Procedures (Signed)
Successful LP at L3/4 using fluoro.  OP 13 cm H20.   No complications.  JWatts MD

## 2016-12-25 NOTE — Progress Notes (Signed)
Subjective: Still reporting headaches on right side which is where she typically has her headaches. Initially was decling LP but later agreed to it.  Exam: Vitals:   12/25/16 1338 12/25/16 1732  BP: 100/63 97/68  Pulse: 62 62  Resp: 18 18  Temp: 98.1 F (36.7 C) 98.2 F (36.8 C)   Gen: In bed, NAD Resp: non-labored breathing, no acute distress Abd: soft, nt HEENT: Neck is supple.  Neuro: MS: Awake, alert and oriented to all spheres. Cn: Pupils 3mm and reactive to light bilaterally. No facial droop. VFF. Tongue midline. Motor: 5/5 throughout. Sensory: Normal throughout  Impression:  [id number] years old AAF with history of migraine which are similar except does not get confused. MRI is normal and LP results reviewed showed no evidence of infections. I have a suspicion for functional disorder however this will be a diagnosis of exclusion. Still awaiting some other studies such as EEG and MRV.  Recommend -Stopping all CNS covering antibiotics as LP results are normal. -Will order EEG to rule out anything epileptiform discharges. -MRV Brain to rule out venous thrombus.  If all above negative she can be discharged to follow up with outpatient Neurology.

## 2016-12-25 NOTE — Progress Notes (Addendum)
[facility] PROGRESS NOTE  Grace Bishop JWL:[id number] DOB: 1986-08-14 DOA: 12/24/2016   PCP: Kathrynn Running, PA     Assessment/Plan: Principal Problem:   Encephalopathy Active Problems:   Acute intractable headache   Altered mental status   31 y.o. female with a PMHx of lupus, migraines, anxiety and ovarian cyst, who presented initially to OSH with severe headache.  She  presented to the [facility] ED a few hours later after waking up still confused and with continued headache. Notes from the second visit document inability to follow commands, but occasional ability to verbalize normally. She was unable to describe her symptoms with significant level of detail. LP was attempted multiple times without success. Patient evaluated by neurology, empirically started on treatment for meningitis   Assessment and plan Intractable headache/altered mental status/acute metabolic encephalopathy  DDX includes CNS lupus, other vasculitis or autoimmune encephalopathy (Hashimoto encephalopathy) seizure, Seen by neurology, They recommend LP, MRI of the brain, MRV, CT of the head and neck, EEG, Thiamine level, B12 level, TSH, C-reactive protein, ANA, C-ANCA, P-ANCA, ammonia, AST, ALT.  7. If above tests are negative, consider obtaining the following send out labs: anti-NMDA receptor Ab, anti-voltage gated potassium channel Ab, anti-thyroperoxidase Ab, anti-thyroglobulin Ab.  Continue empiric meningitis dose antibiotics: Ceftriaxone, vancomycin, acyclovir Will continue empiric treatment for meningitis while awaiting LP under fluoro (spoke with radiology, ) -Continue to monitor, as this may resolve spontaneously at some point  Lupus without flare  Patient is on plaquinel. CRP less than 0.8, ESR 55  Hypokalemia-replete   Anemia of chronic disease-hemoglobin noted to be around 9-10,chronic   Migraine HA  Resume baclofen  S/p one dose of toradol and iv reglan    DVT prophylaxsis  Lovenox on hold for LP   Code Status:  Full code    Family Communication: Discussed in detail with the patient, all imaging results, lab results explained to the patient   Disposition Plan: LP today,     Consultants: Neurology   Procedures: LP 2/9  Antibiotics: Anti-infectives    Start     Dose/Rate Route Frequency Ordered Stop   12/25/16 1000  hydroxychloroquine (PLAQUENIL) tablet 200 mg     200 mg Oral Daily 12/24/16 2035     12/24/16 2200  cefTRIAXone (ROCEPHIN) 2 g in dextrose 5 % 50 mL IVPB     2 g 100 mL/hr over 30 Minutes Intravenous Every 12 hours 12/24/16 2029     12/24/16 2130  vancomycin (VANCOCIN) 500 mg in sodium chloride 0.9 % 100 mL IVPB     500 mg 100 mL/hr over 60 Minutes Intravenous Every 8 hours 12/24/16 2043     12/24/16 2100  acyclovir (ZOVIRAX) 545 mg in dextrose 5 % 100 mL IVPB     10 mg/kg  54.4 kg 110.9 mL/hr over 60 Minutes Intravenous Every 8 hours 12/24/16 2029     12/24/16 1030  cefTRIAXone (ROCEPHIN) 2 g in dextrose 5 % 50 mL IVPB     2 g 100 mL/hr over 30 Minutes Intravenous  Once 12/24/16 1020 12/24/16 1151   12/24/16 1030  vancomycin (VANCOCIN) IVPB 1000 mg/200 mL premix     1,000 mg 200 mL/hr over 60 Minutes Intravenous  Once 12/24/16 1020 12/24/16 1251         HPI/Subjective: Remains afebrile , hard to convince her to get LP, due to hx of migraine HA   Objective: Vitals:   12/24/16 1827 12/24/16 2146 12/25/16 0135 12/25/16 0510  BP: (!) 105/57 117/75 106/65 98/64  Pulse: 75 78 84 86  Resp: (!) '21 16 16 16  ' Temp: 99.3 F (37.4 C) 99.8 F (37.7 C) 98.9 F (37.2 C) 98.5 F (36.9 C)  TempSrc: Oral Oral Oral Oral  SpO2: 99% 99% 99% 98%  Weight:      Height:        Intake/Output Summary (Last 24 hours) at 12/25/16 0905 Last data filed at 12/25/16 0700  Gross per 24 hour  Intake           1599.3 ml  Output                0 ml  Net           1599.3 ml    Exam:  Examination:  General exam: Appears calm and  comfortable  Respiratory system: Clear to auscultation. Respiratory effort normal. Cardiovascular system: S1 & S2 heard, RRR. No JVD, murmurs, rubs, gallops or clicks. No pedal edema. Gastrointestinal system: Abdomen is nondistended, soft and nontender. No organomegaly or masses felt. Normal bowel sounds heard. Central nervous system: Alert and oriented. No focal neurological deficits. Extremities: Symmetric 5 x 5 power. Skin: No rashes, lesions or ulcers Psychiatry: Judgement and insight appear normal. Mood & affect appropriate.     Data Reviewed: I have personally reviewed following labs and imaging studies  Micro Results No results found for this or any previous visit (from the past 240 hour(s)).  Radiology Reports Dg Chest 2 View  Result Date: 12/24/2016 CLINICAL DATA:  Headache.  Recent pneumonia. EXAM: CHEST  2 VIEW COMPARISON:  12/08/2016 FINDINGS: No focal consolidation today. There is mild interstitial coarsening without edema, effusion, or pneumothorax. Normal heart size and mediastinal contours. IMPRESSION: No definite acute disease.  No focal pneumonia. Electronically Signed   By: Monte Fantasia M.D.   On: 12/24/2016 08:30   Dg Chest 2 View  Result Date: 12/08/2016 CLINICAL DATA:  Body ache with diarrhea and fatigue. EXAM: CHEST  2 VIEW COMPARISON:  12/06/2016 FINDINGS: Airspace disease seen previously at the right lung base has decreased in the interval. Left lung remains clear. The cardiopericardial silhouette is within normal limits for size. The visualized bony structures of the thorax are intact. IMPRESSION: Interval decrease in right basilar airspace disease. Electronically Signed   By: Misty Stanley M.D.   On: 12/08/2016 20:04   Dg Chest 2 View  Result Date: 12/06/2016 CLINICAL DATA:  Cough, 1 day duration.  Bilateral arm and leg pain. EXAM: CHEST  2 VIEW COMPARISON:  None. FINDINGS: Heart size is normal. Mediastinal shadows are normal. The left lung is clear. There is  right middle lobe pneumonia. No effusions. No bony abnormalities. IMPRESSION: Right middle lobe pneumonia Electronically Signed   By: Nelson Chimes M.D.   On: 12/06/2016 09:56   Ct Head Wo Contrast  Result Date: 12/24/2016 CLINICAL DATA:  New onset confusion, nausea, vomiting, diarrhea and headache, onset of symptoms yesterday at 2000 hours, history of migraines, recent pneumonia, lupus EXAM: CT HEAD WITHOUT CONTRAST TECHNIQUE: Contiguous axial images were obtained from the base of the skull through the vertex without intravenous contrast. COMPARISON:  12/23/2016 FINDINGS: Brain: Normal ventricular morphology. No midline shift or mass effect. Normal appearance of brain parenchyma. No intracranial hemorrhage, mass lesion, evidence of acute infarction, or extra-axial fluid collection. Vascular: Normal appearance Skull: Normal appearance Sinuses/Orbits: Clear Other: Scattered dural calcifications near vertex. IMPRESSION: Normal exam. Electronically Signed   By: Crist Infante.D.  On: 12/24/2016 08:29   Ct Head Wo Contrast  Result Date: 12/24/2016 CLINICAL DATA:  31 y/o  F; headache. EXAM: CT HEAD WITHOUT CONTRAST TECHNIQUE: Contiguous axial images were obtained from the base of the skull through the vertex without intravenous contrast. COMPARISON:  None. FINDINGS: Brain: No evidence of acute infarction, hemorrhage, hydrocephalus, extra-axial collection or mass lesion/mass effect. Vascular: No hyperdense vessel or unexpected calcification. Skull: Normal. Negative for fracture or focal lesion. Sinuses/Orbits: No acute finding. Other: Numerous punctate calcifications within the parotid glands. IMPRESSION: 1. No acute intracranial abnormality identified. Unremarkable CT of the brain for age. 2. Parotid calcifications likely representing sequelae of prior infectious or inflammatory process. Electronically Signed   By: Kristine Garbe M.D.   On: 12/24/2016 00:04   Mr Jeri Cos GN Contrast  Result Date:  12/24/2016 CLINICAL DATA:  Altered mental status.  Migraines. EXAM: MRI HEAD WITHOUT AND WITH CONTRAST TECHNIQUE: Multiplanar, multiecho pulse sequences of the brain and surrounding structures were obtained without and with intravenous contrast. CONTRAST:  30m MULTIHANCE GADOBENATE DIMEGLUMINE 529 MG/ML IV SOLN COMPARISON:  Head CT 12/24/2016 and 12/23/2016 FINDINGS: Brain: No focal diffusion restriction to indicate acute infarct. No intraparenchymal hemorrhage. The brain parenchymal signal is normal. No mass lesion or midline shift. No hydrocephalus or extra-axial fluid collection. The midline structures are normal. No age advanced or lobar predominant atrophy. Vascular: Major intracranial arterial and venous sinus flow voids are preserved. No evidence of chronic microhemorrhage or amyloid angiopathy. Skull and upper cervical spine: The visualized skull base, calvarium, upper cervical spine and extracranial soft tissues are normal. Sinuses/Orbits: No fluid levels or advanced mucosal thickening. No mastoid effusion. Normal orbits. IMPRESSION: Normal brain MRI. Electronically Signed   By: KUlyses JarredM.D.   On: 12/24/2016 21:17     CBC  Recent Labs Lab 12/23/16 2319 12/24/16 0805 12/25/16 0243  WBC 5.6 3.8* 4.3  HGB 11.5* 10.9* 9.1*  HCT 34.1* 32.4* 28.0*  PLT 222 201 171  MCV 83.0 82.7 82.1  MCH 28.0 27.8 26.7  MCHC 33.7 33.6 32.5  RDW 12.4 12.1 12.6  LYMPHSABS 0.8 0.4*  --   MONOABS 0.6 0.3  --   EOSABS 0.0 0.0  --   BASOSABS 0.0 0.0  --     Chemistries   Recent Labs Lab 12/24/16 0019 12/24/16 0805 12/25/16 0243  NA 134* 136 137  K 3.1* 3.7 3.4*  CL 104 107 109  CO2 23 21* 20*  GLUCOSE 164* 135* 96  BUN '9 8 6  ' CREATININE 0.54 0.51 0.57  CALCIUM 9.2 9.1 8.5*  AST 27  --   --   ALT 24  --   --   ALKPHOS 42  --   --   BILITOT 0.5  --   --    ------------------------------------------------------------------------------------------------------------------ estimated  creatinine clearance is 87.5 mL/min (by C-G formula based on SCr of 0.57 mg/dL). ------------------------------------------------------------------------------------------------------------------ No results for input(s): HGBA1C in the last 72 hours. ------------------------------------------------------------------------------------------------------------------ No results for input(s): CHOL, HDL, LDLCALC, TRIG, CHOLHDL, LDLDIRECT in the last 72 hours. ------------------------------------------------------------------------------------------------------------------ No results for input(s): TSH, T4TOTAL, T3FREE, THYROIDAB in the last 72 hours.  Invalid input(s): FREET3 ------------------------------------------------------------------------------------------------------------------ No results for input(s): VITAMINB12, FOLATE, FERRITIN, TIBC, IRON, RETICCTPCT in the last 72 hours.  Coagulation profile No results for input(s): INR, PROTIME in the last 168 hours.  No results for input(s): DDIMER in the last 72 hours.  Cardiac Enzymes No results for input(s): CKMB, TROPONINI, MYOGLOBIN in the last 168 hours.  Invalid input(s):  CK ------------------------------------------------------------------------------------------------------------------ Invalid input(s): POCBNP   CBG:  Recent Labs Lab 12/24/16 0803  GLUCAP 128*       Studies: Dg Chest 2 View  Result Date: 12/24/2016 CLINICAL DATA:  Headache.  Recent pneumonia. EXAM: CHEST  2 VIEW COMPARISON:  12/08/2016 FINDINGS: No focal consolidation today. There is mild interstitial coarsening without edema, effusion, or pneumothorax. Normal heart size and mediastinal contours. IMPRESSION: No definite acute disease.  No focal pneumonia. Electronically Signed   By: Monte Fantasia M.D.   On: 12/24/2016 08:30   Ct Head Wo Contrast  Result Date: 12/24/2016 CLINICAL DATA:  New onset confusion, nausea, vomiting, diarrhea and headache, onset of  symptoms yesterday at 2000 hours, history of migraines, recent pneumonia, lupus EXAM: CT HEAD WITHOUT CONTRAST TECHNIQUE: Contiguous axial images were obtained from the base of the skull through the vertex without intravenous contrast. COMPARISON:  12/23/2016 FINDINGS: Brain: Normal ventricular morphology. No midline shift or mass effect. Normal appearance of brain parenchyma. No intracranial hemorrhage, mass lesion, evidence of acute infarction, or extra-axial fluid collection. Vascular: Normal appearance Skull: Normal appearance Sinuses/Orbits: Clear Other: Scattered dural calcifications near vertex. IMPRESSION: Normal exam. Electronically Signed   By: Lavonia Dana M.D.   On: 12/24/2016 08:29   Ct Head Wo Contrast  Result Date: 12/24/2016 CLINICAL DATA:  31 y/o  F; headache. EXAM: CT HEAD WITHOUT CONTRAST TECHNIQUE: Contiguous axial images were obtained from the base of the skull through the vertex without intravenous contrast. COMPARISON:  None. FINDINGS: Brain: No evidence of acute infarction, hemorrhage, hydrocephalus, extra-axial collection or mass lesion/mass effect. Vascular: No hyperdense vessel or unexpected calcification. Skull: Normal. Negative for fracture or focal lesion. Sinuses/Orbits: No acute finding. Other: Numerous punctate calcifications within the parotid glands. IMPRESSION: 1. No acute intracranial abnormality identified. Unremarkable CT of the brain for age. 2. Parotid calcifications likely representing sequelae of prior infectious or inflammatory process. Electronically Signed   By: Kristine Garbe M.D.   On: 12/24/2016 00:04   Mr Jeri Cos AL Contrast  Result Date: 12/24/2016 CLINICAL DATA:  Altered mental status.  Migraines. EXAM: MRI HEAD WITHOUT AND WITH CONTRAST TECHNIQUE: Multiplanar, multiecho pulse sequences of the brain and surrounding structures were obtained without and with intravenous contrast. CONTRAST:  19m MULTIHANCE GADOBENATE DIMEGLUMINE 529 MG/ML IV SOLN  COMPARISON:  Head CT 12/24/2016 and 12/23/2016 FINDINGS: Brain: No focal diffusion restriction to indicate acute infarct. No intraparenchymal hemorrhage. The brain parenchymal signal is normal. No mass lesion or midline shift. No hydrocephalus or extra-axial fluid collection. The midline structures are normal. No age advanced or lobar predominant atrophy. Vascular: Major intracranial arterial and venous sinus flow voids are preserved. No evidence of chronic microhemorrhage or amyloid angiopathy. Skull and upper cervical spine: The visualized skull base, calvarium, upper cervical spine and extracranial soft tissues are normal. Sinuses/Orbits: No fluid levels or advanced mucosal thickening. No mastoid effusion. Normal orbits. IMPRESSION: Normal brain MRI. Electronically Signed   By: KUlyses JarredM.D.   On: 12/24/2016 21:17      No results found for: HGBA1C Lab Results  Component Value Date   CREATININE 0.57 12/25/2016       Scheduled Meds: . acyclovir  10 mg/kg Intravenous Q8H  . cefTRIAXone (ROCEPHIN)  IV  2 g Intravenous Q12H  . hydroxychloroquine  200 mg Oral Daily  . vancomycin  500 mg Intravenous Q8H   Continuous Infusions: . sodium chloride 150 mL/hr at 12/25/16 0756     LOS: 1 day    Time spent: >30  [facility] Pager [phone]. If 7PM-7AM, please contact night-coverage at [email or web address], password [facility] 12/25/2016, 9:05 AM  LOS: 1 day

## 2016-12-25 NOTE — Care Management Note (Signed)
Case Management Note  Patient Details  Name: Grace Bishop MRN: [id number] [id number] Date of Birth: [birth year]  Subjective/Objective:             Patient was admitted with altered mental status. Lives at home with fiance. CM will follow for discharge needs pending patient's progress and physician orders.        Action/Plan:   Expected Discharge Date:   (pending)               Expected Discharge Plan:     In-House Referral:     Discharge planning Services     Post Acute Care Choice:    Choice offered to:     DME Arranged:    DME Agency:     HH Arranged:    HH Agency:     Status of Service:     If discussed at [employer] Length of Stay Meetings, dates discussed:    Additional Comments:  Grace Bishop, Grace Doutt C, RN 12/25/2016, 10:13 AM

## 2016-12-25 NOTE — Progress Notes (Signed)
Initial Nutrition Assessment  DOCUMENTATION CODES:   Not applicable  INTERVENTION:   -Boost Breeze po TID, each supplement provides 250 kcal and 9 grams of protein  NUTRITION DIAGNOSIS:   Inadequate oral intake related to nausea as evidenced by meal completion < 25%.  GOAL:   Patient will meet greater than or equal to 90% of their needs  MONITOR:   PO intake, Supplement acceptance, Labs, Weight trends, I & O's  REASON FOR ASSESSMENT:   Malnutrition Screening Tool    ASSESSMENT:   Grace Bishop is a 31 y.o. female with medical history significant of hypothyroidism, migraines, anxiety, and lupus presenting with AMS.    Pt admitted with AMS.   Pt very lethargic at time of visit. Unable to complete nutriton-focused physical exam related to pt covered in multiple blankets. No family at bedside to obtain additional hx.  Per chart review, pt has experienced significant nausea PTA and currently. Meal completion 0%. Noted multiple cups of liquids on tray table at bedside.   Noted pt has experienced a 4# (3.2%) wt loss x 2 days, likely related to dehydration. UBW 124#. Pt appears to be petite in stature.   Neurology following; plan for lumbar puncture.   Labs reviewed: K: 3.4.   Diet Order:  Diet regular Room service appropriate? Yes; Fluid consistency: Thin  Skin:  Reviewed, no issues  Last BM:  12/23/16  Height:   Ht Readings from Last 1 Encounters:  12/24/16 5\' 4"  (1.626 m)    Weight:   Wt Readings from Last 1 Encounters:  12/24/16 120 lb (54.4 kg)    Ideal Body Weight:  54.4 kg  BMI:  Body mass index is 20.6 kg/m.  Estimated Nutritional Needs:   Kcal:  1450-1650  Protein:  70-85 grams  Fluid:  >1.4 L  EDUCATION NEEDS:   No education needs identified at this time  Jennier Schissler A. Mayford KnifeWilliams, RD, LDN, CDE Pager: [phone] After hours Pager: [phone]

## 2016-12-26 ENCOUNTER — Inpatient Hospital Stay (HOSPITAL_COMMUNITY)
Admit: 2016-12-26 | Discharge: 2016-12-26 | Disposition: A | Discharge status: Home / Self Care | Payer: BLUE CROSS/BLUE SHIELD | Attending: Internal Medicine | Admitting: Internal Medicine

## 2016-12-26 ENCOUNTER — Inpatient Hospital Stay (HOSPITAL_COMMUNITY): Payer: BLUE CROSS/BLUE SHIELD

## 2016-12-26 DIAGNOSIS — G44201 Tension-type headache, unspecified, intractable: Secondary | ICD-10-CM

## 2016-12-26 DIAGNOSIS — R400 Somnolence: Secondary | ICD-10-CM

## 2016-12-26 LAB — COMPREHENSIVE METABOLIC PANEL
ALBUMIN: 2.8 g/dL — AB (ref 3.5–5.0)
ALK PHOS: 32 U/L — AB (ref 38–126)
ALT: 19 U/L (ref 14–54)
AST: 22 U/L (ref 15–41)
Anion gap: 9 (ref 5–15)
BUN: <5 mg/dL — ABNORMAL LOW (ref 6–20)
CALCIUM: 8.6 mg/dL — AB (ref 8.9–10.3)
CO2: 23 mmol/L (ref 22–32)
CREATININE: 0.55 mg/dL (ref 0.44–1.00)
Chloride: 107 mmol/L (ref 101–111)
GFR calc Af Amer: >60 mL/min (ref >60)
GFR calc non Af Amer: >60 mL/min (ref >60)
GLUCOSE: 90 mg/dL (ref 65–99)
Potassium: 3.6 mmol/L (ref 3.5–5.1)
SODIUM: 139 mmol/L (ref 135–145)
Total Bilirubin: 0.4 mg/dL (ref 0.3–1.2)
Total Protein: 6.4 g/dL — ABNORMAL LOW (ref 6.5–8.1)

## 2016-12-26 LAB — CBC
HCT: 29.4 % — ABNORMAL LOW (ref 36.0–46.0)
HEMOGLOBIN: 9.5 g/dL — AB (ref 12.0–15.0)
MCH: 26.9 pg (ref 26.0–34.0)
MCHC: 32.3 g/dL (ref 30.0–36.0)
MCV: 83.3 fL (ref 78.0–100.0)
Platelets: 187 10*3/uL (ref 150–400)
RBC: 3.53 MIL/uL — AB (ref 3.87–5.11)
RDW: 12.8 % (ref 11.5–15.5)
WBC: 3.2 10*3/uL — ABNORMAL LOW (ref 4.0–10.5)

## 2016-12-26 LAB — HERPES SIMPLEX VIRUS(HSV) DNA BY PCR
HSV 1 DNA: NEGATIVE
HSV 2 DNA: NEGATIVE

## 2016-12-26 LAB — HIV ANTIBODY (ROUTINE TESTING W REFLEX): HIV Screen 4th Generation wRfx: NONREACTIVE

## 2016-12-26 MED ORDER — BUTALBITAL-APAP-CAFFEINE 50-325-40 MG PO TABS: 2.0000 | ORAL_TABLET | Freq: Once | ORAL | 0 refills | Status: AC

## 2016-12-26 MED ORDER — BUTALBITAL-APAP-CAFFEINE 50-325-40 MG PO TABS: 2.0000 | ORAL_TABLET | Freq: Once | ORAL | Status: DC

## 2016-12-26 NOTE — Progress Notes (Signed)
Pt discharged home. Discharged instructions were reviewed with pt. PT verbalized understanding.

## 2016-12-26 NOTE — Progress Notes (Signed)
Patient complained about a HA- not time for more tylenol. MD/NP notified.

## 2016-12-26 NOTE — Discharge Summary (Signed)
Physician Discharge Summary  Grace Bishop MRN: [id number] DOB/AGE: 14-Jan-1986 31 y.o.  PCP: Kathrynn Running, PA   Admit date: 12/24/2016 Discharge date: 12/26/2016  Discharge Diagnoses:    Principal Problem:   Encephalopathy Active Problems:   Acute intractable headache   Altered mental status    Follow-up recommendations Follow-up with PCP in 3-5 days , including all  additional recommended appointments as below Follow-up CBC, CMP in 3-5 days Patient to follow-up with neurology in the outpatient setting      Current Discharge Medication List    START taking these medications   Details  butalbital-acetaminophen-caffeine (FIORICET, ESGIC) 50-325-40 MG tablet Take 2 tablets by mouth once. Qty: 45 tablet, Refills: 0      CONTINUE these medications which have NOT CHANGED   Details  baclofen (LIORESAL) 10 MG tablet Take 10 mg by mouth 3 (three) times daily.    hydroxychloroquine (PLAQUENIL) 200 MG tablet Take 200 mg by mouth daily.    ondansetron (ZOFRAN) 4 MG tablet Take 4 mg by mouth daily as needed for nausea or vomiting.     promethazine (PHENERGAN) 25 MG tablet Take 25 mg by mouth every 6 (six) hours as needed for nausea or vomiting.      STOP taking these medications     amoxicillin-clavulanate (AUGMENTIN XR) 1000-62.5 MG 12 hr tablet          Discharge Condition: Stable   Discharge Instructions Get Medicines reviewed and adjusted: Please take all your medications with you for your next visit with your Primary MD  Please request your Primary MD to go over all hospital tests and procedure/radiological results at the follow up, please ask your Primary MD to get all Hospital records sent to his/her office.  If you experience worsening of your admission symptoms, develop shortness of breath, life threatening emergency, suicidal or homicidal thoughts you must seek medical attention immediately by calling 911 or calling your MD immediately if symptoms  less severe.  You must read complete instructions/literature along with all the possible adverse reactions/side effects for all the Medicines you take and that have been prescribed to you. Take any new Medicines after you have completely understood and accpet all the possible adverse reactions/side effects.   Do not drive when taking Pain medications.   Do not take more than prescribed Pain, Sleep and Anxiety Medications  Special Instructions: If you have smoked or chewed Tobacco in the last 2 yrs please stop smoking, stop any regular Alcohol and or any Recreational drug use.  Wear Seat belts while driving.  Please note  You were cared for by a hospitalist during your hospital stay. Once you are discharged, your primary care physician will handle any further medical issues. Please note that NO REFILLS for any discharge medications will be authorized once you are discharged, as it is imperative that you return to your primary care physician (or establish a relationship with a primary care physician if you do not have one) for your aftercare needs so that they can reassess your need for medications and monitor your lab values.     No Known Allergies    Disposition: 01-Home or Self Care   Consults: * Neurology     Significant Diagnostic Studies:  Dg Chest 2 View  Result Date: 12/24/2016 CLINICAL DATA:  Headache.  Recent pneumonia. EXAM: CHEST  2 VIEW COMPARISON:  12/08/2016 FINDINGS: No focal consolidation today. There is mild interstitial coarsening without edema, effusion, or pneumothorax. Normal heart size and mediastinal contours.  IMPRESSION: No definite acute disease.  No focal pneumonia. Electronically Signed   By: Marnee Spring M.D.   On: 12/24/2016 08:30   Dg Chest 2 View  Result Date: 12/08/2016 CLINICAL DATA:  Body ache with diarrhea and fatigue. EXAM: CHEST  2 VIEW COMPARISON:  12/06/2016 FINDINGS: Airspace disease seen previously at the right lung base has decreased  in the interval. Left lung remains clear. The cardiopericardial silhouette is within normal limits for size. The visualized bony structures of the thorax are intact. IMPRESSION: Interval decrease in right basilar airspace disease. Electronically Signed   By: Kennith Center M.D.   On: 12/08/2016 20:04   Dg Chest 2 View  Result Date: 12/06/2016 CLINICAL DATA:  Cough, 1 day duration.  Bilateral arm and leg pain. EXAM: CHEST  2 VIEW COMPARISON:  None. FINDINGS: Heart size is normal. Mediastinal shadows are normal. The left lung is clear. There is right middle lobe pneumonia. No effusions. No bony abnormalities. IMPRESSION: Right middle lobe pneumonia Electronically Signed   By: Paulina Fusi M.D.   On: 12/06/2016 09:56   Ct Head Wo Contrast  Result Date: 12/24/2016 CLINICAL DATA:  New onset confusion, nausea, vomiting, diarrhea and headache, onset of symptoms yesterday at 2000 hours, history of migraines, recent pneumonia, lupus EXAM: CT HEAD WITHOUT CONTRAST TECHNIQUE: Contiguous axial images were obtained from the base of the skull through the vertex without intravenous contrast. COMPARISON:  12/23/2016 FINDINGS: Brain: Normal ventricular morphology. No midline shift or mass effect. Normal appearance of brain parenchyma. No intracranial hemorrhage, mass lesion, evidence of acute infarction, or extra-axial fluid collection. Vascular: Normal appearance Skull: Normal appearance Sinuses/Orbits: Clear Other: Scattered dural calcifications near vertex. IMPRESSION: Normal exam. Electronically Signed   By: Ulyses Southward M.D.   On: 12/24/2016 08:29   Ct Head Wo Contrast  Result Date: 12/24/2016 CLINICAL DATA:  31 y/o  F; headache. EXAM: CT HEAD WITHOUT CONTRAST TECHNIQUE: Contiguous axial images were obtained from the base of the skull through the vertex without intravenous contrast. COMPARISON:  None. FINDINGS: Brain: No evidence of acute infarction, hemorrhage, hydrocephalus, extra-axial collection or mass lesion/mass  effect. Vascular: No hyperdense vessel or unexpected calcification. Skull: Normal. Negative for fracture or focal lesion. Sinuses/Orbits: No acute finding. Other: Numerous punctate calcifications within the parotid glands. IMPRESSION: 1. No acute intracranial abnormality identified. Unremarkable CT of the brain for age. 2. Parotid calcifications likely representing sequelae of prior infectious or inflammatory process. Electronically Signed   By: Mitzi Hansen M.D.   On: 12/24/2016 00:04   Mr Laqueta Jean VJ Contrast  Result Date: 12/24/2016 CLINICAL DATA:  Altered mental status.  Migraines. EXAM: MRI HEAD WITHOUT AND WITH CONTRAST TECHNIQUE: Multiplanar, multiecho pulse sequences of the brain and surrounding structures were obtained without and with intravenous contrast. CONTRAST:  58mL MULTIHANCE GADOBENATE DIMEGLUMINE 529 MG/ML IV SOLN COMPARISON:  Head CT 12/24/2016 and 12/23/2016 FINDINGS: Brain: No focal diffusion restriction to indicate acute infarct. No intraparenchymal hemorrhage. The brain parenchymal signal is normal. No mass lesion or midline shift. No hydrocephalus or extra-axial fluid collection. The midline structures are normal. No age advanced or lobar predominant atrophy. Vascular: Major intracranial arterial and venous sinus flow voids are preserved. No evidence of chronic microhemorrhage or amyloid angiopathy. Skull and upper cervical spine: The visualized skull base, calvarium, upper cervical spine and extracranial soft tissues are normal. Sinuses/Orbits: No fluid levels or advanced mucosal thickening. No mastoid effusion. Normal orbits. IMPRESSION: Normal brain MRI. Electronically Signed   By: Chrisandra Netters.D.  On: 12/24/2016 21:17   Mr Annell Greening Head  Result Date: 12/26/2016 CLINICAL DATA:  31 y/o F; persistent headache. History of lupus and migraines. EXAM: MR VENOGRAM OF THE HEAD TECHNIQUE: Coronal and sagittal MR venogram of the head was performed with multiplanar IVI  reconstruction. COMPARISON:  12/24/2016 MRI of the head. FINDINGS: No evidence of dural venous sinus thrombosis. Normal flow related signal within the superior sagittal sinus, straight sinus, bilateral transverse sinuses, bilateral sigmoid sinuses, bilateral upper internal jugular veins, internal cerebral veins, basal veins of Rosenthal, and large cortical veins. IMPRESSION: No evidence of dural venous sinus thrombosis. Electronically Signed   By: Kristine Garbe M.D.   On: 12/26/2016 05:32   Dg Fluoro Guide Lumbar Puncture  Result Date: 12/25/2016 CLINICAL DATA:  Encephalopathy and headache. EXAM: DIAGNOSTIC LUMBAR PUNCTURE UNDER FLUOROSCOPIC GUIDANCE FLUOROSCOPY TIME:  Fluoroscopy Time:  0.2 minutes Radiation Exposure Index (if provided by the fluoroscopic device): 0.9 mGy Number of Acquired Spot Images: 0 PROCEDURE: Informed consent was obtained from the patient prior to the procedure. With the patient prone, the lower back was prepped with Betadine. 1% Lidocaine was used for local anesthesia. Lumbar puncture was performed at the L3-4 level using a 20 gauge needle with return of clear CSF with an early/opening pressure of 13 cm water (patient was rolled left lateral decubitus prior to measuring pressure). 7 ml of CSF were obtained for laboratory studies. The patient tolerated the procedure well and there were no apparent complications. IMPRESSION: 1. Successful lumbar puncture at L3-4. 2. Opening pressure of 13 cm water. Electronically Signed   By: Monte Fantasia M.D.   On: 12/25/2016 14:52      Filed Weights   12/24/16 0714  Weight: 54.4 kg (120 lb)     Microbiology: Recent Results (from the past 240 hour(s))  CSF culture     Status: None (Preliminary result)   Collection Time: 12/25/16  2:31 PM  Result Value Ref Range Status   Specimen Description CSF  Final   Special Requests NONE  Final   Gram Stain   Final    WBC PRESENT, PREDOMINANTLY MONONUCLEAR NO ORGANISMS SEEN CYTOSPIN  SMEAR    Culture PENDING  Incomplete   Report Status PENDING  Incomplete       Blood Culture    Component Value Date/Time   SDES CSF 12/25/2016 1431   SPECREQUEST NONE 12/25/2016 1431   CULT PENDING 12/25/2016 1431   REPTSTATUS PENDING 12/25/2016 1431      Labs: Results for orders placed or performed during the hospital encounter of 12/24/16 (from the past 48 hour(s))  Urine rapid drug screen (hosp performed)     Status: None   Collection Time: 12/24/16  3:31 PM  Result Value Ref Range   Opiates NONE DETECTED NONE DETECTED   Cocaine NONE DETECTED NONE DETECTED   Benzodiazepines NONE DETECTED NONE DETECTED   Amphetamines NONE DETECTED NONE DETECTED   Tetrahydrocannabinol NONE DETECTED NONE DETECTED   Barbiturates NONE DETECTED NONE DETECTED    Comment:        DRUG SCREEN FOR MEDICAL PURPOSES ONLY.  IF CONFIRMATION IS NEEDED FOR ANY PURPOSE, NOTIFY LAB WITHIN 5 DAYS.        LOWEST DETECTABLE LIMITS FOR URINE DRUG SCREEN Drug Class       Cutoff (ng/mL) Amphetamine      1000 Barbiturate      200 Benzodiazepine   546 Tricyclics       568 Opiates  300 Cocaine          300 THC              50 Performed at [facility], [address] [place], [place] [address]   Urinalysis, Routine w reflex microscopic     Status: Abnormal   Collection Time: 12/24/16  3:45 PM  Result Value Ref Range   Color, Urine YELLOW YELLOW   APPearance CLEAR CLEAR   Specific Gravity, Urine 1.017 1.005 - 1.030   pH 7.0 5.0 - 8.0   Glucose, UA NEGATIVE NEGATIVE mg/dL   Hgb urine dipstick NEGATIVE NEGATIVE   Bilirubin Urine NEGATIVE NEGATIVE   Ketones, ur 40 (A) NEGATIVE mg/dL   Protein, ur NEGATIVE NEGATIVE mg/dL   Nitrite NEGATIVE NEGATIVE   Leukocytes, UA NEGATIVE NEGATIVE    Comment: Microscopic not done on urines with negative protein, blood, leukocytes, nitrite, or glucose < 500 mg/dL.  Sedimentation rate     Status: Abnormal   Collection Time: 12/24/16  3:45 PM   Result Value Ref Range   Sed Rate 55 (H) 0 - 22 mm/hr  C-reactive protein     Status: None   Collection Time: 12/24/16  3:45 PM  Result Value Ref Range   CRP <0.8 <1.0 mg/dL    Comment: Performed at [facility] [address], [place],  [address]  Basic metabolic panel     Status: Abnormal   Collection Time: 12/25/16  2:43 AM  Result Value Ref Range   Sodium 137 135 - 145 mmol/L   Potassium 3.4 (L) 3.5 - 5.1 mmol/L   Chloride 109 101 - 111 mmol/L   CO2 20 (L) 22 - 32 mmol/L   Glucose, Bld 96 65 - 99 mg/dL   BUN 6 6 - 20 mg/dL   Creatinine, Ser 0.57 0.44 - 1.00 mg/dL   Calcium 8.5 (L) 8.9 - 10.3 mg/dL   GFR calc non Af Amer >60 >60 mL/min   GFR calc Af Amer >60 >60 mL/min    Comment: (NOTE) The eGFR has been calculated using the CKD EPI equation. This calculation has not been validated in all clinical situations. eGFR's persistently <60 mL/min signify possible Chronic Kidney Disease.    Anion gap 8 5 - 15  CBC     Status: Abnormal   Collection Time: 12/25/16  2:43 AM  Result Value Ref Range   WBC 4.3 4.0 - 10.5 K/uL   RBC 3.41 (L) 3.87 - 5.11 MIL/uL   Hemoglobin 9.1 (L) 12.0 - 15.0 g/dL   HCT 28.0 (L) 36.0 - 46.0 %   MCV 82.1 78.0 - 100.0 fL   MCH 26.7 26.0 - 34.0 pg   MCHC 32.5 30.0 - 36.0 g/dL   RDW 12.6 11.5 - 15.5 %   Platelets 171 150 - 400 K/uL  Glucose, CSF     Status: None   Collection Time: 12/25/16  2:31 PM  Result Value Ref Range   Glucose, CSF 46 40 - 70 mg/dL  Protein, CSF     Status: None   Collection Time: 12/25/16  2:31 PM  Result Value Ref Range   Total  Protein, CSF 26 15 - 45 mg/dL  CSF cell count with differential     Status: None   Collection Time: 12/25/16  2:31 PM  Result Value Ref Range   Tube # 1    Color, CSF COLORLESS COLORLESS   Appearance, CSF CLEAR CLEAR   Supernatant NOT INDICATED  RBC Count, CSF 0 0 /cu mm   WBC, CSF 4 0 - 5 /cu mm   Lymphs, CSF FEW 40 - 80 %   Other Cells, CSF TOO FEW TO COUNT, SMEAR AVAILABLE  FOR REVIEW   CSF culture     Status: None (Preliminary result)   Collection Time: 12/25/16  2:31 PM  Result Value Ref Range   Specimen Description CSF    Special Requests NONE    Gram Stain      WBC PRESENT, PREDOMINANTLY MONONUCLEAR NO ORGANISMS SEEN CYTOSPIN SMEAR    Culture PENDING    Report Status PENDING   HIV antibody     Status: None   Collection Time: 12/25/16  2:56 PM  Result Value Ref Range   HIV Screen 4th Generation wRfx Non Reactive Non Reactive    Comment: (NOTE) Performed At: [facility], [place], [place] [id number] Lindon Romp MD PQ:[id number]   CBC     Status: Abnormal   Collection Time: 12/26/16  5:39 AM  Result Value Ref Range   WBC 3.2 (L) 4.0 - 10.5 K/uL   RBC 3.53 (L) 3.87 - 5.11 MIL/uL   Hemoglobin 9.5 (L) 12.0 - 15.0 g/dL   HCT 29.4 (L) 36.0 - 46.0 %   MCV 83.3 78.0 - 100.0 fL   MCH 26.9 26.0 - 34.0 pg   MCHC 32.3 30.0 - 36.0 g/dL   RDW 12.8 11.5 - 15.5 %   Platelets 187 150 - 400 K/uL  Comprehensive metabolic panel     Status: Abnormal   Collection Time: 12/26/16  5:39 AM  Result Value Ref Range   Sodium 139 135 - 145 mmol/L   Potassium 3.6 3.5 - 5.1 mmol/L   Chloride 107 101 - 111 mmol/L   CO2 23 22 - 32 mmol/L   Glucose, Bld 90 65 - 99 mg/dL   BUN <5 (L) 6 - 20 mg/dL   Creatinine, Ser 0.55 0.44 - 1.00 mg/dL   Calcium 8.6 (L) 8.9 - 10.3 mg/dL   Total Protein 6.4 (L) 6.5 - 8.1 g/dL   Albumin 2.8 (L) 3.5 - 5.0 g/dL   AST 22 15 - 41 U/L   ALT 19 14 - 54 U/L   Alkaline Phosphatase 32 (L) 38 - 126 U/L   Total Bilirubin 0.4 0.3 - 1.2 mg/dL   GFR calc non Af Amer >60 >60 mL/min   GFR calc Af Amer >60 >60 mL/min    Comment: (NOTE) The eGFR has been calculated using the CKD EPI equation. This calculation has not been validated in all clinical situations. eGFR's persistently <60 mL/min signify possible Chronic Kidney Disease.    Anion gap 9 5 - 15     Lipid Panel  No results found for: CHOL, TRIG, HDL,  CHOLHDL, VLDL, LDLCALC, LDLDIRECT   No results found for: HGBA1C   Lab Results  Component Value Date   CREATININE 0.55 12/26/2016     HPI :  Grace Bishop is an 31 y.o. female with a PMHx of lupus, migraines, anxiety and ovarian cyst, who presented initially to OSH with severe headache. She has had migraines in the past, along with the current symptoms, but they have never lasted this long.   Her symptoms began at about 8 PM on Wednesday. Her husband came home and she was complaining of a headache; she seemed confused and had some speech problems. He noted that this happens with her migraines on occasion. She was seen at  the [facility] ED where basic work up was negative, and discharged at 0200 on Thursday. The thought at that visit was that her altered mental status had something to do with overmedication. Baclofen is one of her migraine medications, in addition to promethazine and magnesium. She had described to them that she has had migraines in the past, but symptoms this time were atypical.   She then represented to the [facility] ED a few hours later after waking up still confused and with continued headache. Notes from the second visit document inability to follow commands, but occasional ability to verbalize normally. She was unable to describe her symptoms with significant level of detail. LP was attempted multiple times without success. She was febrile but otherwise stable. She was started on Vancomycin and Rocephin. She was transferred to [facility] for MRI and Neurology evaluation.     HOSPITAL COURSE:  Intractable headache/altered mental status/acute metabolic encephalopathy DDX includes CNS lupus, other vasculitis or autoimmune encephalopathy (Hashimoto encephalopathy) seizure, this episode seems to be due to complicated migraine Seen by neurology, negative LP, negative MRI of the brain, negative MRV,   EEG pending, can be done as an outpatient if not completed inpatient Recommend outpatient  Thiamine level, B12 level, TSH,  C-reactive protein within normal limits,  Recommend outpatient ANA, C-ANCA, P-ANCA to rule out other autoimmune causes.  Initially placed on empiric treatment for meningitis: Ceftriaxone, vancomycin, acyclovir, discontinued due to negative LP Follow-up with headache specialist in [place]   Lupus without flare  Patient is on plaquinel. CRP less than 0.8, ESR 55  Hypokalemia-repleted   Anemia of chronic disease-hemoglobin noted to be around 9-10,chronic . This needs further outpatient workup  Migraine HA  Resume baclofen/Phenergan , started on Fioricet as needed       Discharge Exam:   Blood pressure 106/70, pulse 62, temperature 98.5 F (36.9 C), temperature source Oral, resp. rate 18, height _0  (1.626 m), weight 54.4 kg (120 lb), last menstrual period 12/18/2016, SpO2 100 %.  General exam: Appears calm and comfortable  Respiratory system: Clear to auscultation. Respiratory effort normal. Cardiovascular system: S1 & S2 heard, RRR. No JVD, murmurs, rubs, gallops or clicks. No pedal edema. Gastrointestinal system: Abdomen is nondistended, soft and nontender. No organomegaly or masses felt. Normal bowel sounds heard. Central nervous system: Alert and oriented. No focal neurological deficits. Extremities: Symmetric 5 x 5 power. Skin: No rashes, lesions or ulcers Psychiatry: Judgement and insight appear normal. Mood & affect appropriate.      Follow-up Information    Kathrynn Running, [place]. Call.   Specialty:  Family Medicine Why:  Tube follow-up appointment, hospital follow-up Contact information: [address] [place] [address] [phone]           Signed: Reyne Dumas 12/26/2016, 10:03 AM        Time spent >45 mins

## 2016-12-26 NOTE — Progress Notes (Signed)
EEG completed, results pending. 

## 2016-12-26 NOTE — Procedures (Signed)
EEG Report Date2/10/17 Referring physician Luan PullingQaiser Toqeer  Reason for the study Medication- 0.9 % sodium chloride infusion   acetaminophen (TYLENOL) tablet 650 mg   enoxaparin (LOVENOX) injection 40 mg   ondansetron (ZOFRAN) tablet 4 mg   Plaquenil   Technical-This is a multichannel digital EEG recording using the international 10-20 placement system.  Description of the recording Posterior dominant rhythm 9 hx symmetrical Photic stimulation did not produce any abnormal response During recording patient had an episode looking to left, confused raising right arm then left, then climbing gesture with arms. No epileptiform activity was seen Sleep not obtained  Impression The EEG is normal in awake and drowsy state. The Episode recorded as described above was not associated with any epileptiform activity

## 2016-12-28 ENCOUNTER — Encounter (HOSPITAL_BASED_OUTPATIENT_CLINIC_OR_DEPARTMENT_OTHER): Payer: Self-pay | Admitting: *Deleted

## 2016-12-28 ENCOUNTER — Emergency Department (HOSPITAL_BASED_OUTPATIENT_CLINIC_OR_DEPARTMENT_OTHER)
Admission: EM | Admit: 2016-12-28 | Discharge: 2016-12-29 | Disposition: A | Discharge status: Home / Self Care | Payer: BLUE CROSS/BLUE SHIELD | Attending: Emergency Medicine | Admitting: Emergency Medicine

## 2016-12-28 DIAGNOSIS — R112 Nausea with vomiting, unspecified: Secondary | ICD-10-CM | POA: Diagnosis present

## 2016-12-28 DIAGNOSIS — R51 Headache: Secondary | ICD-10-CM | POA: Insufficient documentation

## 2016-12-28 DIAGNOSIS — Z87891 Personal history of nicotine dependence: Secondary | ICD-10-CM | POA: Insufficient documentation

## 2016-12-28 DIAGNOSIS — R519 Headache, unspecified: Secondary | ICD-10-CM

## 2016-12-28 LAB — CBC WITH DIFFERENTIAL/PLATELET
Basophils Absolute: 0 10*3/uL (ref 0.0–0.1)
Basophils Relative: 0 %
EOS ABS: 0 10*3/uL (ref 0.0–0.7)
Eosinophils Relative: 0 %
HCT: 33.1 % — ABNORMAL LOW (ref 36.0–46.0)
HEMOGLOBIN: 11.2 g/dL — AB (ref 12.0–15.0)
LYMPHS ABS: 0.5 10*3/uL — AB (ref 0.7–4.0)
Lymphocytes Relative: 16 %
MCH: 27.7 pg (ref 26.0–34.0)
MCHC: 33.8 g/dL (ref 30.0–36.0)
MCV: 81.9 fL (ref 78.0–100.0)
MONOS PCT: 15 %
Monocytes Absolute: 0.4 10*3/uL (ref 0.1–1.0)
NEUTROS PCT: 69 %
Neutro Abs: 2 10*3/uL (ref 1.7–7.7)
Platelets: 240 10*3/uL (ref 150–400)
RBC: 4.04 MIL/uL (ref 3.87–5.11)
RDW: 11.9 % (ref 11.5–15.5)
WBC: 2.9 10*3/uL — ABNORMAL LOW (ref 4.0–10.5)

## 2016-12-28 LAB — COMPREHENSIVE METABOLIC PANEL
ALK PHOS: 40 U/L (ref 38–126)
ALT: 24 U/L (ref 14–54)
ANION GAP: 6 (ref 5–15)
AST: 20 U/L (ref 15–41)
Albumin: 3.5 g/dL (ref 3.5–5.0)
BILIRUBIN TOTAL: 0.6 mg/dL (ref 0.3–1.2)
BUN: <5 mg/dL — ABNORMAL LOW (ref 6–20)
CALCIUM: 9 mg/dL (ref 8.9–10.3)
CO2: 26 mmol/L (ref 22–32)
Chloride: 105 mmol/L (ref 101–111)
Creatinine, Ser: 0.49 mg/dL (ref 0.44–1.00)
Glucose, Bld: 102 mg/dL — ABNORMAL HIGH (ref 65–99)
Potassium: 3.3 mmol/L — ABNORMAL LOW (ref 3.5–5.1)
SODIUM: 137 mmol/L (ref 135–145)
Total Protein: 7.6 g/dL (ref 6.5–8.1)

## 2016-12-28 LAB — URINALYSIS, ROUTINE W REFLEX MICROSCOPIC
Bilirubin Urine: NEGATIVE
GLUCOSE, UA: NEGATIVE mg/dL
Hgb urine dipstick: NEGATIVE
KETONES UR: 40 mg/dL — AB
LEUKOCYTES UA: NEGATIVE
Nitrite: NEGATIVE
PH: 7.5 (ref 5.0–8.0)
Protein, ur: NEGATIVE mg/dL
Specific Gravity, Urine: 1.014 (ref 1.005–1.030)

## 2016-12-28 LAB — HSV DNA BY PCR (REFERENCE LAB): HSV 1 DNA: NEGATIVE

## 2016-12-28 LAB — HERPES SIMPLEX VIRUS(HSV) DNA BY PCR: HSV 2 DNA: NEGATIVE

## 2016-12-28 LAB — VDRL, CSF: VDRL Quant, CSF: NONREACTIVE

## 2016-12-28 LAB — PREGNANCY, URINE: Preg Test, Ur: NEGATIVE

## 2016-12-28 MED ORDER — ONDANSETRON 4 MG PO TBDP
4.0000 mg | ORAL_TABLET | Freq: Once | ORAL | Status: AC
Start: 2016-12-28 — End: 2016-12-28
  Administered 2016-12-28: 4 mg via ORAL
  Filled 2016-12-28: qty 1

## 2016-12-28 MED ORDER — IBUPROFEN 400 MG PO TABS
400.0000 mg | ORAL_TABLET | Freq: Once | ORAL | Status: AC
  Administered 2016-12-28: 400 mg via ORAL
  Filled 2016-12-28: qty 1

## 2016-12-28 MED ORDER — HALOPERIDOL LACTATE 5 MG/ML IJ SOLN
5.0000 mg | Freq: Once | INTRAMUSCULAR | Status: AC
  Administered 2016-12-28: 5 mg via INTRAVENOUS
  Filled 2016-12-28: qty 1

## 2016-12-28 MED ORDER — IBUPROFEN 400 MG PO TABS
ORAL_TABLET | ORAL | Status: AC
  Filled 2016-12-28: qty 1

## 2016-12-28 MED ORDER — SODIUM CHLORIDE 0.9 % IV BOLUS (SEPSIS)
1000.0000 mL | Freq: Once | INTRAVENOUS | Status: AC
  Administered 2016-12-28: 1000 mL via INTRAVENOUS

## 2016-12-28 MED ORDER — DEXAMETHASONE SODIUM PHOSPHATE 10 MG/ML IJ SOLN
10.0000 mg | Freq: Once | INTRAMUSCULAR | Status: AC
  Administered 2016-12-28: 10 mg via INTRAVENOUS
  Filled 2016-12-28: qty 1

## 2016-12-28 NOTE — ED Triage Notes (Signed)
Vomiting and headache. She was seen for migraine last week and admitted. LP was done.

## 2016-12-28 NOTE — ED Provider Notes (Signed)
[facility] Note   CSN: [id number] Arrival date & time: 12/28/16  1829  By signing my name below, I, Rosario AdieWilliam Andrew Hiatt, attest that this documentation has been prepared under the direction and in the presence of Lyndal Pulleyaniel Ednah Hammock, MD. Electronically Signed: Rosario AdieWilliam Andrew Hiatt, ED Scribe. 12/28/16. 10:41 PM.  History   Chief Complaint Chief Complaint  Patient presents with  . Emesis   The history is provided by the patient. No language interpreter was used.  Headache   This is a recurrent problem. The current episode started more than 2 days ago. The problem occurs constantly. The problem has not changed since onset.The headache is associated with nothing. The pain is at a severity of 5/10. The pain is moderate. The pain does not radiate. Associated symptoms include nausea and vomiting. Pertinent negatives include no fever. She has tried nothing for the symptoms.    HPI Comments: Grace Bishop is a 31 y.o. female with a h/o migraines, lupus, and encephalopathy, who presents to the Emergency Department complaining of gradual onset, persistent headache beginning last week. Pt describes her headache as pressure-like. She reports associated nausea, vomiting, and diarrhea. Pt has sustained five episodes of emesis today. She has a h/o migraines and notes that her headache today feels similar to this, but increased pressure from her typical ones. Per prior chart review, pt was seen in the ED and subsequently admitted for AMS on 12/24/16. At that time she underwent an LP to r/o meningitis and additionally she had a CT/MRI which were both unremarkable. She denies abdominal pain, fever, or any other associated symptoms.   Past Medical History:  Diagnosis Date  . Anxiety   . Lupus    joint pain  . Migraines   . Ovarian cyst   . Thyroid disease    Patient Active Problem List   Diagnosis Date Noted  . Acute intractable headache   . Altered mental status   . Encephalopathy  12/24/2016   Past Surgical History:  Procedure Laterality Date  . OVARIAN CYST REMOVAL    . TONSILLECTOMY     OB History    No data available     Home Medications    Prior to Admission medications   Medication Sig Start Date End Date Taking? Authorizing Provider  baclofen (LIORESAL) 10 MG tablet Take 10 mg by mouth 3 (three) times daily.    Historical Provider, MD  hydroxychloroquine (PLAQUENIL) 200 MG tablet Take 200 mg by mouth daily.    Historical Provider, MD  ondansetron (ZOFRAN) 4 MG tablet Take 4 mg by mouth daily as needed for nausea or vomiting.  12/13/16   Historical Provider, MD  promethazine (PHENERGAN) 25 MG tablet Take 25 mg by mouth every 6 (six) hours as needed for nausea or vomiting.    Historical Provider, MD   Family History Family History  Problem Relation Age of Onset  . Thyroid disease Mother   . Drug abuse Father   . Sickle cell trait Sister    Social History Social History  Substance Use Topics  . Smoking status: Former Smoker    Years: 2.00  . Smokeless tobacco: Never Used  . Alcohol use No   Allergies   Patient has no known allergies.  Review of Systems Review of Systems  Constitutional: Negative for fever.  Gastrointestinal: Positive for nausea and vomiting. Negative for abdominal pain.  Neurological: Positive for headaches.  All other systems reviewed and are negative.  Physical Exam Updated Vital Signs BP  114/71   Pulse 80   Temp 98.1 F (36.7 C) (Oral)   Resp 16   Ht 5\' 4"  (1.626 m)   Wt 120 lb (54.4 kg)   LMP 12/18/2016 Comment: negative pregnancy test  SpO2 100%   BMI 20.60 kg/m   Physical Exam  Constitutional: She is oriented to person, place, and time. She appears well-developed and well-nourished. No distress.  HENT:  Head: Normocephalic.  Nose: Nose normal.  Eyes: Conjunctivae are normal.  Neck: Neck supple. No tracheal deviation present.  Cardiovascular: Normal rate and regular rhythm.   Pulmonary/Chest: Effort  normal. No respiratory distress.  Abdominal: Soft. She exhibits no distension.  Neurological: She is alert and oriented to person, place, and time. She has normal strength. No cranial nerve deficit or sensory deficit.  Skin: Skin is warm and dry.  Psychiatric: She has a normal mood and affect.   ED Treatments / Results  DIAGNOSTIC STUDIES: Oxygen Saturation is 100% on RA, normal by my interpretation.   COORDINATION OF CARE: 10:41 PM-Discussed next steps with pt. Pt verbalized understanding and is agreeable with the plan.   Labs (all labs ordered are listed, but only abnormal results are displayed) Labs Reviewed  URINALYSIS, ROUTINE W REFLEX MICROSCOPIC - Abnormal; Notable for the following:       Result Value   APPearance CLOUDY (*)    Ketones, ur 40 (*)    All other components within normal limits  PREGNANCY, URINE   EKG  EKG Interpretation None      Radiology No results found.  Procedures Procedures   Medications Ordered in ED Medications  ibuprofen (ADVIL,MOTRIN) 400 MG tablet (not administered)  ondansetron (ZOFRAN-ODT) disintegrating tablet 4 mg (4 mg Oral Given 12/28/16 1908)  ibuprofen (ADVIL,MOTRIN) tablet 400 mg (400 mg Oral Given 12/28/16 1907)   Initial Impression / Assessment and Plan / ED Course  I have reviewed the triage vital signs and the nursing notes.  Pertinent labs & imaging results that were available during my care of the patient were reviewed by me and considered in my medical decision making (see chart for details).     31 y.o. female presents with recurrent headache typical of migraine with recent admission for atypical migraine and extensive workup completed which was unremarkable. Has been vomiting today but labs reassuring and without signs of urine or other infection or organ dysfunction. Provided haldol, decadron and fluids for migraine and losses. Symptoms resolved. Plan to follow up with PCP as needed and return precautions discussed  for worsening or new concerning symptoms.   Final Clinical Impressions(s) / ED Diagnoses   Final diagnoses:  Nausea and vomiting, intractability of vomiting not specified, unspecified vomiting type  Headache, unspecified headache type   New Prescriptions New Prescriptions   No medications on file   I personally performed the services described in this documentation, which was scribed in my presence. The recorded information has been reviewed and is accurate.     Lyndal Pulley, MD 12/29/16 [phone]

## 2016-12-29 LAB — CSF CULTURE: CULTURE: NO GROWTH

## 2016-12-29 LAB — OLIGOCLONAL BANDS, CSF + SERM

## 2016-12-29 LAB — CSF CULTURE W GRAM STAIN

## 2016-12-30 ENCOUNTER — Emergency Department (HOSPITAL_BASED_OUTPATIENT_CLINIC_OR_DEPARTMENT_OTHER)
Admission: EM | Admit: 2016-12-30 | Discharge: 2016-12-30 | Disposition: A | Discharge status: Home / Self Care | Payer: BLUE CROSS/BLUE SHIELD | Source: Home / Self Care | Attending: Emergency Medicine | Admitting: Emergency Medicine

## 2016-12-30 ENCOUNTER — Encounter (HOSPITAL_BASED_OUTPATIENT_CLINIC_OR_DEPARTMENT_OTHER): Payer: Self-pay | Admitting: Emergency Medicine

## 2016-12-30 ENCOUNTER — Emergency Department (HOSPITAL_BASED_OUTPATIENT_CLINIC_OR_DEPARTMENT_OTHER)
Admission: EM | Admit: 2016-12-30 | Discharge: 2016-12-30 | Disposition: A | Discharge status: Home / Self Care | Payer: BLUE CROSS/BLUE SHIELD | Attending: Emergency Medicine | Admitting: Emergency Medicine

## 2016-12-30 DIAGNOSIS — Z79899 Other long term (current) drug therapy: Secondary | ICD-10-CM | POA: Diagnosis not present

## 2016-12-30 DIAGNOSIS — Y829 Unspecified medical devices associated with adverse incidents: Secondary | ICD-10-CM | POA: Insufficient documentation

## 2016-12-30 DIAGNOSIS — Z87891 Personal history of nicotine dependence: Secondary | ICD-10-CM | POA: Diagnosis not present

## 2016-12-30 DIAGNOSIS — T887XXA Unspecified adverse effect of drug or medicament, initial encounter: Secondary | ICD-10-CM

## 2016-12-30 DIAGNOSIS — G8929 Other chronic pain: Secondary | ICD-10-CM

## 2016-12-30 DIAGNOSIS — G2402 Drug induced acute dystonia: Secondary | ICD-10-CM | POA: Insufficient documentation

## 2016-12-30 DIAGNOSIS — T380X5A Adverse effect of glucocorticoids and synthetic analogues, initial encounter: Secondary | ICD-10-CM

## 2016-12-30 DIAGNOSIS — R51 Headache: Secondary | ICD-10-CM | POA: Diagnosis present

## 2016-12-30 MED ORDER — DEXAMETHASONE SODIUM PHOSPHATE 10 MG/ML IJ SOLN
10.0000 mg | Freq: Once | INTRAMUSCULAR | Status: AC
  Administered 2016-12-30: 10 mg via INTRAVENOUS
  Filled 2016-12-30: qty 1

## 2016-12-30 MED ORDER — DIPHENHYDRAMINE HCL 25 MG PO CAPS
50.0000 mg | ORAL_CAPSULE | Freq: Once | ORAL | Status: AC
  Administered 2016-12-30: 50 mg via ORAL
  Filled 2016-12-30: qty 2

## 2016-12-30 MED ORDER — HALOPERIDOL LACTATE 5 MG/ML IJ SOLN
5.0000 mg | Freq: Once | INTRAMUSCULAR | Status: AC
  Administered 2016-12-30: 5 mg via INTRAVENOUS
  Filled 2016-12-30: qty 1

## 2016-12-30 MED ORDER — DIPHENHYDRAMINE HCL 25 MG PO TABS: 25.0000 mg | ORAL_TABLET | Freq: Four times a day (QID) | ORAL | 0 refills | Status: DC

## 2016-12-30 NOTE — ED Provider Notes (Signed)
[facility]   CSN: [id number] Arrival date & time: 12/30/16  1740  By signing my name below, I, Doreatha Martin, attest that this documentation has been prepared under the direction and in the presence of Jerelyn Scott, MD. Electronically Signed: Doreatha Martin, ED Scribe. 12/30/16. 7:13 PM.     History   Chief Complaint Chief Complaint  Patient presents with  . Medication Reaction    HPI Grace Bishop is a 31 y.o. female who presents to the Emergency Department complaining of multiple waxing and waning symptoms that began 2 days ago after starting 2 new medications, dexamethazone and Haldol. Pt states this is the first time she was given these medications and she is not on them at home. She complains of her jaw clenching, mouth dryness and the sensation of abnormal tongue movement. Per pt, she had a negative LP 4 days ago for evaluation of a migraine during recent hospitalization and has also had intermittent HA and nausea since, after prolonged periods of sitting. Per pt, she lied flat for the appropriate amount of hours after the procedure as instructed. She reports her HA and nausea are alleviated with lying down. She denies additional complaints.   The history is provided by the patient. No language interpreter was used.  Headache   This is a new problem. The current episode started 2 days ago. Episode frequency: intermittent. Associated with: prolonged sitting. Pain location: generalized. The pain is mild. The pain does not radiate. Associated symptoms include nausea. She has tried resting in a darkened room for the symptoms. The treatment provided mild relief.    Past Medical History:  Diagnosis Date  . Anxiety   . Lupus    joint pain  . Migraines   . Ovarian cyst   . Thyroid disease     Patient Active Problem List   Diagnosis Date Noted  . Acute intractable headache   . Altered mental status   . Encephalopathy 12/24/2016    Past Surgical History:   Procedure Laterality Date  . OVARIAN CYST REMOVAL    . TONSILLECTOMY      OB History    No data available       Home Medications    Prior to Admission medications   Medication Sig Start Date End Date Taking? Authorizing Provider  baclofen (LIORESAL) 10 MG tablet Take 10 mg by mouth 3 (three) times daily.    Historical Provider, MD  diphenhydrAMINE (BENADRYL) 25 MG tablet Take 1 tablet (25 mg total) by mouth every 6 (six) hours. 12/30/16   Jerelyn Scott, MD  hydroxychloroquine (PLAQUENIL) 200 MG tablet Take 200 mg by mouth daily.    Historical Provider, MD  ondansetron (ZOFRAN) 4 MG tablet Take 4 mg by mouth daily as needed for nausea or vomiting.  12/13/16   Historical Provider, MD  promethazine (PHENERGAN) 25 MG tablet Take 25 mg by mouth every 6 (six) hours as needed for nausea or vomiting.    Historical Provider, MD    Family History Family History  Problem Relation Age of Onset  . Thyroid disease Mother   . Drug abuse Father   . Sickle cell trait Sister     Social History Social History  Substance Use Topics  . Smoking status: Former Smoker    Years: 2.00  . Smokeless tobacco: Never Used  . Alcohol use No     Allergies   Patient has no known allergies.   Review of Systems Review of Systems  HENT:       +  jaw clenching, mouth dryness, sensation of abnormal tongue movement   Gastrointestinal: Positive for nausea.  Neurological: Positive for headaches.  All other systems reviewed and are negative.   Physical Exam Updated Vital Signs BP 119/83 (BP Location: Left Arm)   Pulse 90   Temp 98.9 F (37.2 C) (Oral)   Resp 16   Ht 5\' 4"  (1.626 m)   Wt 120 lb (54.4 kg)   LMP 12/18/2016   SpO2 100%   BMI 20.60 kg/m  Vitals reviewed Physical Exam Physical Examination: General appearance - alert, well appearing, and in no distress Mental status - alert, oriented to person, place, and time Eyes - pupils equal and reactive, extraocular eye movements  intact Mouth - mucous membranes moist, pharynx normal without lesions Neck - supple, no significant adenopathy Chest - clear to auscultation, no wheezes, rales or rhonchi, symmetric air entry Heart - normal rate, regular rhythm, normal S1, S2, no murmurs, rubs, clicks or gallops Neurological - alert, oriented x 3, no cranial nerve deficit, strength 5/5 in extremities x 4, sensation intact Extremities - peripheral pulses normal, no pedal edema, no clubbing or cyanosis Skin - normal coloration and turgor, no rashes  ED Treatments / Results   DIAGNOSTIC STUDIES: Oxygen Saturation is 100% on RA, normal by my interpretation.    COORDINATION OF CARE: 7:09 PM Discussed treatment plan with pt at bedside which includes Benadryl and pt agreed to plan.    Labs (all labs ordered are listed, but only abnormal results are displayed) Labs Reviewed - No data to display  EKG  EKG Interpretation None       Radiology No results found.  Procedures Procedures (including critical care time)  Medications Ordered in ED Medications  diphenhydrAMINE (BENADRYL) capsule 50 mg (50 mg Oral Given 12/30/16 1914)     Initial Impression / Assessment and Plan / ED Course  I have reviewed the triage vital signs and the nursing notes.  Pertinent labs & imaging results that were available during my care of the patient were reviewed by me and considered in my medical decision making (see chart for details).     Pt presenting with c/o jaw clenching and abnormal tongue movements after getting doses of haldol in the ED yesterday and 2 days ago. She states she has not had this medication in the past that she knows of.  This sounds most c/w dystonic reaction.  Pt has a normal neuro exam.  Given dose of benadryl.  Advised to take benadryl for the next couple of days and f/u with PMD and neurology as advised.  Discharged with strict return precautions.  Pt agreeable with plan.  Final Clinical Impressions(s) / ED  Diagnoses   Final diagnoses:  Dystonic drug reaction    New Prescriptions Discharge Medication List as of 12/30/2016  7:22 PM    START taking these medications   Details  diphenhydrAMINE (BENADRYL) 25 MG tablet Take 1 tablet (25 mg total) by mouth every 6 (six) hours., Starting Wed 12/30/2016, Print        I personally performed the services described in this documentation, which was scribed in my presence. The recorded information has been reviewed and is accurate.     Jerelyn ScottMartha Linker, MD 12/30/16 [phone]

## 2016-12-30 NOTE — ED Triage Notes (Signed)
Pt reports "strange side effects" from the medication she received here the past couple of days.  Sts her mouth is dry and tongue is hurting.  Her jaws feel "clenched". "My head is feeling swimmy" and "I am cold".

## 2016-12-30 NOTE — Discharge Instructions (Signed)
Return to the ED with any concerns including difficulty breathing, difficulty swallowing, vomiting and not able to keep down liquids, lip or tongue swelling, decreased level of alertness/lethargy, or any other alarming symptoms

## 2016-12-30 NOTE — ED Triage Notes (Signed)
Pt c/o HA, right rib pain, right leg pain. Pt states she is still having complications from LP done on sat.

## 2016-12-30 NOTE — ED Provider Notes (Signed)
[facility]   CSN: [id number] Arrival date & time: 12/30/16  0022     History   Chief Complaint Chief Complaint  Patient presents with  . Headache    HPI Grace Bishop is a 31 y.o. female with a past medical history of anxiety, lupus, migraines, presenting today with worsening headache. Patient states that she always has headaches but this is stable from her lumbar puncture done 4 days ago. She states her headaches are now become worse and her home medications are not working. She was seen here 2 days ago and received Decadron with Haldol. She states this was also headache but it has now come back. She denies neurological symptoms. Her headache is consistent with her prior episodes. No fevers or recent illness. There are no further complaints.   10 Systems reviewed and are negative for acute change except as noted in the HPI.   HPI  Past Medical History:  Diagnosis Date  . Anxiety   . Lupus    joint pain  . Migraines   . Ovarian cyst   . Thyroid disease     Patient Active Problem List   Diagnosis Date Noted  . Acute intractable headache   . Altered mental status   . Encephalopathy 12/24/2016    Past Surgical History:  Procedure Laterality Date  . OVARIAN CYST REMOVAL    . TONSILLECTOMY      OB History    No data available       Home Medications    Prior to Admission medications   Medication Sig Start Date End Date Taking? Authorizing Provider  baclofen (LIORESAL) 10 MG tablet Take 10 mg by mouth 3 (three) times daily.    Historical Provider, MD  hydroxychloroquine (PLAQUENIL) 200 MG tablet Take 200 mg by mouth daily.    Historical Provider, MD  ondansetron (ZOFRAN) 4 MG tablet Take 4 mg by mouth daily as needed for nausea or vomiting.  12/13/16   Historical Provider, MD  promethazine (PHENERGAN) 25 MG tablet Take 25 mg by mouth every 6 (six) hours as needed for nausea or vomiting.    Historical Provider, MD    Family  History Family History  Problem Relation Age of Onset  . Thyroid disease Mother   . Drug abuse Father   . Sickle cell trait Sister     Social History Social History  Substance Use Topics  . Smoking status: Former Smoker    Years: 2.00  . Smokeless tobacco: Never Used  . Alcohol use No     Allergies   Patient has no known allergies.   Review of Systems Review of Systems   Physical Exam Updated Vital Signs BP 119/82 (BP Location: Right Arm)   Pulse 75   Temp 98.2 F (36.8 C) (Oral)   Resp 16   LMP 12/18/2016 Comment: negative pregnancy test  SpO2 100%   Physical Exam  Constitutional: She is oriented to person, place, and time. She appears well-developed and well-nourished. No distress.  HENT:  Head: Normocephalic and atraumatic.  Nose: Nose normal.  Mouth/Throat: Oropharynx is clear and moist. No oropharyngeal exudate.  Eyes: Conjunctivae and EOM are normal. Pupils are equal, round, and reactive to light. No scleral icterus.  Neck: Normal range of motion. Neck supple. No JVD present. No tracheal deviation present. No thyromegaly present.  Cardiovascular: Normal rate, regular rhythm and normal heart sounds.  Exam reveals no gallop and no friction rub.   No murmur heard. Pulmonary/Chest: Effort  normal and breath sounds normal. No respiratory distress. She has no wheezes. She exhibits no tenderness.  Abdominal: Soft. Bowel sounds are normal. She exhibits no distension and no mass. There is no tenderness. There is no rebound and no guarding.  Musculoskeletal: Normal range of motion. She exhibits no edema or tenderness.  Lymphadenopathy:    She has no cervical adenopathy.  Neurological: She is alert and oriented to person, place, and time. No cranial nerve deficit. She exhibits normal muscle tone.  Normal strength and sensation in all extremities. Normal cerebellar testing. Normal gait.  Skin: Skin is warm and dry. No rash noted. No erythema. No pallor.  Nursing note  and vitals reviewed.    ED Treatments / Results  Labs (all labs ordered are listed, but only abnormal results are displayed) Labs Reviewed - No data to display  EKG  EKG Interpretation None       Radiology No results found.  Procedures Procedures (including critical care time)  Medications Ordered in ED Medications  dexamethasone (DECADRON) injection 10 mg (10 mg Intravenous Given 12/30/16 0112)  haloperidol lactate (HALDOL) injection 5 mg (5 mg Intravenous Given 12/30/16 0110)     Initial Impression / Assessment and Plan / ED Course  I have reviewed the triage vital signs and the nursing notes.  Pertinent labs & imaging results that were available during my care of the patient were reviewed by me and considered in my medical decision making (see chart for details).     Patient presents to the emergency department for headache. Will give Tylenol and Decadron as this worked last time. Strongly advised to follow-up with primary care to establish better maintenance of her headaches.    2:15 AM Patient states her headache has improved.  She demonstrates good understanding. Neurological exam is completely normal. She appears well-developed acute distress, vital signs were within her normal limits and she is safe for discharge.  Final Clinical Impressions(s) / ED Diagnoses   Final diagnoses:  None    New Prescriptions New Prescriptions   No medications on file     Tomasita Crumble, MD 12/30/16 [phone]

## 2016-12-30 NOTE — ED Notes (Signed)
Pt appears drowsy. Pt states she took promethazine at 1600 today.

## 2016-12-30 NOTE — ED Notes (Signed)
ED Provider at bedside. 

## 2017-02-18 ENCOUNTER — Encounter (HOSPITAL_BASED_OUTPATIENT_CLINIC_OR_DEPARTMENT_OTHER): Payer: Self-pay | Admitting: Emergency Medicine

## 2017-02-18 ENCOUNTER — Emergency Department (HOSPITAL_BASED_OUTPATIENT_CLINIC_OR_DEPARTMENT_OTHER)
Admission: EM | Admit: 2017-02-18 | Discharge: 2017-02-18 | Disposition: A | Discharge status: Home / Self Care | Payer: BLUE CROSS/BLUE SHIELD | Attending: Emergency Medicine | Admitting: Emergency Medicine

## 2017-02-18 DIAGNOSIS — R1013 Epigastric pain: Secondary | ICD-10-CM | POA: Insufficient documentation

## 2017-02-18 DIAGNOSIS — Z79899 Other long term (current) drug therapy: Secondary | ICD-10-CM | POA: Insufficient documentation

## 2017-02-18 DIAGNOSIS — Z87891 Personal history of nicotine dependence: Secondary | ICD-10-CM | POA: Insufficient documentation

## 2017-02-18 DIAGNOSIS — M542 Cervicalgia: Secondary | ICD-10-CM | POA: Insufficient documentation

## 2017-02-18 DIAGNOSIS — R42 Dizziness and giddiness: Secondary | ICD-10-CM | POA: Insufficient documentation

## 2017-02-18 DIAGNOSIS — R51 Headache: Secondary | ICD-10-CM | POA: Insufficient documentation

## 2017-02-18 DIAGNOSIS — R197 Diarrhea, unspecified: Secondary | ICD-10-CM

## 2017-02-18 DIAGNOSIS — R519 Headache, unspecified: Secondary | ICD-10-CM

## 2017-02-18 LAB — CBC WITH DIFFERENTIAL/PLATELET
Basophils Absolute: 0 10*3/uL (ref 0.0–0.1)
Basophils Relative: 0 %
Eosinophils Absolute: 0.1 10*3/uL (ref 0.0–0.7)
Eosinophils Relative: 3 %
HCT: 32.4 % — ABNORMAL LOW (ref 36.0–46.0)
Hemoglobin: 10.9 g/dL — ABNORMAL LOW (ref 12.0–15.0)
Lymphocytes Relative: 29 %
Lymphs Abs: 0.8 10*3/uL (ref 0.7–4.0)
MCH: 27.8 pg (ref 26.0–34.0)
MCHC: 33.6 g/dL (ref 30.0–36.0)
MCV: 82.7 fL (ref 78.0–100.0)
Monocytes Absolute: 0.6 10*3/uL (ref 0.1–1.0)
Monocytes Relative: 21 %
Neutro Abs: 1.2 10*3/uL — ABNORMAL LOW (ref 1.7–7.7)
Neutrophils Relative %: 47 %
Platelets: 225 10*3/uL (ref 150–400)
RBC: 3.92 MIL/uL (ref 3.87–5.11)
RDW: 12.3 % (ref 11.5–15.5)
WBC: 2.6 10*3/uL — ABNORMAL LOW (ref 4.0–10.5)

## 2017-02-18 LAB — COMPREHENSIVE METABOLIC PANEL
ALT: 23 U/L (ref 14–54)
AST: 21 U/L (ref 15–41)
Albumin: 3.8 g/dL (ref 3.5–5.0)
Alkaline Phosphatase: 44 U/L (ref 38–126)
Anion gap: 4 — ABNORMAL LOW (ref 5–15)
BUN: 9 mg/dL (ref 6–20)
CO2: 27 mmol/L (ref 22–32)
Calcium: 9.1 mg/dL (ref 8.9–10.3)
Chloride: 102 mmol/L (ref 101–111)
Creatinine, Ser: 0.48 mg/dL (ref 0.44–1.00)
GFR calc Af Amer: >60 mL/min (ref >60)
GFR calc non Af Amer: >60 mL/min (ref >60)
Glucose, Bld: 93 mg/dL (ref 65–99)
Potassium: 3.9 mmol/L (ref 3.5–5.1)
Sodium: 133 mmol/L — ABNORMAL LOW (ref 135–145)
Total Bilirubin: 0.3 mg/dL (ref 0.3–1.2)
Total Protein: 7.9 g/dL (ref 6.5–8.1)

## 2017-02-18 LAB — URINALYSIS, ROUTINE W REFLEX MICROSCOPIC
Bilirubin Urine: NEGATIVE
Glucose, UA: NEGATIVE mg/dL
Hgb urine dipstick: NEGATIVE
Ketones, ur: NEGATIVE mg/dL
Leukocytes, UA: NEGATIVE
Nitrite: NEGATIVE
Protein, ur: NEGATIVE mg/dL
Specific Gravity, Urine: 1.01 (ref 1.005–1.030)
pH: 7.5 (ref 5.0–8.0)

## 2017-02-18 LAB — PREGNANCY, URINE: Preg Test, Ur: NEGATIVE

## 2017-02-18 LAB — LIPASE, BLOOD: Lipase: 35 U/L (ref 11–51)

## 2017-02-18 MED ORDER — KETOROLAC TROMETHAMINE 30 MG/ML IJ SOLN
30.0000 mg | Freq: Once | INTRAMUSCULAR | Status: AC
  Administered 2017-02-18: 30 mg via INTRAVENOUS

## 2017-02-18 MED ORDER — KETOROLAC TROMETHAMINE 60 MG/2ML IM SOLN
60.0000 mg | Freq: Once | INTRAMUSCULAR | Status: DC
  Filled 2017-02-18: qty 2

## 2017-02-18 NOTE — ED Provider Notes (Signed)
[facility] Provider Note   CSN: [id number] Arrival date & time: 02/18/17  1848   By signing my name below, I, Grace Bishop, attest that this documentation has been prepared under the direction and in the presence of [facility], [place]. Electronically Signed: Freida Bishop, Scribe. 02/18/2017. 11:45 PM.   History   Chief Complaint Chief Complaint  Patient presents with  . Migraine  . Numbness   The history is provided by the patient. No language interpreter was used.     HPI Comments:  Grace Bishop is a 31 y.o. female with a history of migraine HA, anxiety, and Lupus. who presents to the Emergency Department complaining of a HA x 1 day. She rates her pain a 6.5/10 at arrival to ED and a 4.5/10 at this time. Her pain is from the right occipital region up to right temporal region.  She notes associated RUE numbness and paresthesias of the tongue which she notes are her typical migraine symptoms. She has taken baclofen and promethazine with moderate relief ~ 1800 today.  She denies speech changes, photophobia, and phonophobia.   She is also complaining of mid epigastric abdominal cramping that she notices after eating  x 1 week.; these episodes are followed by watery, non-bloody, non-mucousy diarrhea. Pt reports occasional radiation of pain to the RUQ.  She notes she has been eating fast food more than usual. Pt has a h/o gallstones.  Pt denies hematuria and blood in stool. No h/o abdominal surgeries.    Past Medical History:  Diagnosis Date  . Anxiety   . Lupus    joint pain  . Migraines   . Ovarian cyst   . Thyroid disease     Patient Active Problem List   Diagnosis Date Noted  . Acute intractable headache   . Altered mental status   . Encephalopathy 12/24/2016    Past Surgical History:  Procedure Laterality Date  . OVARIAN CYST REMOVAL    . TONSILLECTOMY      OB History    No data available       Home Medications    Prior to Admission medications     Medication Sig Start Date End Date Taking? Authorizing Provider  baclofen (LIORESAL) 10 MG tablet Take 10 mg by mouth 3 (three) times daily.    Historical Provider, MD  diphenhydrAMINE (BENADRYL) 25 MG tablet Take 1 tablet (25 mg total) by mouth every 6 (six) hours. 12/30/16   Jerelyn Scott, MD  hydroxychloroquine (PLAQUENIL) 200 MG tablet Take 200 mg by mouth daily.    Historical Provider, MD  ondansetron (ZOFRAN) 4 MG tablet Take 4 mg by mouth daily as needed for nausea or vomiting.  12/13/16   Historical Provider, MD  promethazine (PHENERGAN) 25 MG tablet Take 25 mg by mouth every 6 (six) hours as needed for nausea or vomiting.    Historical Provider, MD    Family History Family History  Problem Relation Age of Onset  . Thyroid disease Mother   . Drug abuse Father   . Sickle cell trait Sister     Social History Social History  Substance Use Topics  . Smoking status: Former Smoker    Years: 2.00  . Smokeless tobacco: Never Used  . Alcohol use No     Allergies   Patient has no known allergies.   Review of Systems Review of Systems  Constitutional: Negative for chills and fever.  HENT: Positive for tinnitus.        Chronic,  unchanged  Eyes: Negative for photophobia.  Gastrointestinal: Positive for abdominal pain and diarrhea. Negative for vomiting.  Genitourinary: Negative for dysuria and hematuria.  Musculoskeletal: Positive for neck pain. Negative for back pain.  Neurological: Positive for light-headedness, numbness and headaches.  Psychiatric/Behavioral: Negative for confusion.  All other systems reviewed and are negative.    Physical Exam Updated Vital Signs BP 109/79 (BP Location: Right Arm)   Pulse 70   Temp 98.2 F (36.8 C) (Oral)   Resp 16   Ht  (1.575 m)   Wt 55.3 kg   SpO2 100%   BMI 22.31 kg/m   Physical Exam  Constitutional: She is oriented to person, place, and time. She appears well-developed and well-nourished. No distress.  Resting  comfortably in bed  HENT:  Head: Normocephalic and atraumatic.  Right Ear: External ear normal.  Left Ear: External ear normal.  Eyes: Conjunctivae and EOM are normal. Pupils are equal, round, and reactive to light. Right eye exhibits no discharge. Left eye exhibits no discharge. No scleral icterus.  Neck: Normal range of motion. Neck supple. No JVD present. No tracheal deviation present. No thyromegaly present.  ttp of right trapezius, no midline CSP ttp.  Cardiovascular: Normal rate, regular rhythm, normal heart sounds and intact distal pulses.   Pulmonary/Chest: Effort normal and breath sounds normal.  Abdominal: Soft. Bowel sounds are normal. She exhibits no distension and no mass. There is tenderness. There is no guarding.  Mild epigastric tenderness, negative murphy's, negative Psoas sign, no ttp at McBurney's point  Musculoskeletal: Normal range of motion. She exhibits no edema, tenderness or deformity.  5/5 strength BUE and BLE  Lymphadenopathy:    She has no cervical adenopathy.  Neurological: She is alert and oriented to person, place, and time. No cranial nerve deficit.  No facial droop, fluent speech, normal gait and pt able to heel and toe walk, no pronator drift; decreased sensation of right side of face and Lateral RUE  Skin: Skin is warm and dry. Capillary refill takes less than 2 seconds. She is not diaphoretic.  Psychiatric: She has a normal mood and affect.     ED Treatments / Results  DIAGNOSTIC STUDIES:  Oxygen Saturation is 100% on RA, normal by my interpretation.    COORDINATION OF CARE:  8:47 PM Discussed treatment plan with pt at bedside and pt agreed to plan.  Labs (all labs ordered are listed, but only abnormal results are displayed) Labs Reviewed  CBC WITH DIFFERENTIAL/PLATELET - Abnormal; Notable for the following:       Result Value   WBC 2.6 (*)    Hemoglobin 10.9 (*)    HCT 32.4 (*)    Neutro Abs 1.2 (*)    All other components within normal  limits  COMPREHENSIVE METABOLIC PANEL - Abnormal; Notable for the following:    Sodium 133 (*)    Anion gap 4 (*)    All other components within normal limits  LIPASE, BLOOD  URINALYSIS, ROUTINE W REFLEX MICROSCOPIC  PREGNANCY, URINE    EKG  EKG Interpretation None       Radiology No results found.  Procedures Procedures (including critical care time)  Medications Ordered in ED Medications  ketorolac (TORADOL) 30 MG/ML injection 30 mg (30 mg Intravenous Given 02/18/17 2116)     Initial Impression / Assessment and Plan / ED Course  I have reviewed the triage vital signs and the nursing notes.  Pertinent labs & imaging results that were available during my  care of the patient were reviewed by me and considered in my medical decision making (see chart for details).     30yof presents to ED with chief complaint migraine x1 day and diarrhea x 1 week. Pt afebrile, VSS, pt in NAD. HA is typical migraine presentation for pt. Pt had taken her home medications PTA with improvement in pain. Given Toradol and on re-evaluation states pain and numbness had significantly improved to 1.5/10. Low suspicion ICH or stroke. Abdominal exam with mild epigastric ttp; labwork unremarkable. UA not suggestive of UTI. Low suspicion cholelithiasis, cholecystitis, pancreatitis, or appendicitis in absence of labwork or pain. No further emergent workup required. Suspect possible GERD.  Pt has appt with PCP tomorrow for evaluation of diarrhea; encouraged pt to keep this appt and discussed used of antacids/PPI and hydrate and eat a bland diet until symptoms improved. Given strict ED return precautions. Pt verbalized understanding of and agreement with plan and is stable for d/c home.    Final Clinical Impressions(s) / ED Diagnoses   Final diagnoses:  Bad headache  Diarrhea in adult patient    New Prescriptions Discharge Medication List as of 02/18/2017 10:17 PM     I personally performed the services  described in this documentation, which was scribed in my presence. The recorded information has been reviewed and is accurate.    Jeanie Sewer, PA-C 02/18/17 2357    Loren Racer, MD 02/20/17 [id number]

## 2017-02-18 NOTE — ED Triage Notes (Signed)
Pt c/o migraine HA, right ear pain, right sided numbness and diarrhea x 1 week. Pt not having any difficulty with motor skills on right side.

## 2017-02-18 NOTE — ED Notes (Signed)
Despite complaining of left side numbness and migraine, pt is using phone with left hand and watching television with no apparent difficulty or exacerbation of symptoms.

## 2017-02-18 NOTE — ED Notes (Signed)
Urine sent to lab 

## 2017-02-18 NOTE — ED Notes (Signed)
ED Provider at bedside. 

## 2017-02-18 NOTE — ED Notes (Signed)
Pt is now in lobby. Pt has snacks from vending machine and is eating at this time. Made pt aware she would get next available bed.

## 2017-02-18 NOTE — ED Notes (Signed)
Pt verbalizes understanding of d/c instructions and denies any further needs at this time. 

## 2017-02-18 NOTE — ED Notes (Signed)
Pt called to treatment room with no answer from lobby.  

## 2017-03-29 ENCOUNTER — Encounter (HOSPITAL_BASED_OUTPATIENT_CLINIC_OR_DEPARTMENT_OTHER): Payer: Self-pay | Admitting: Adult Health

## 2017-03-29 ENCOUNTER — Emergency Department (HOSPITAL_BASED_OUTPATIENT_CLINIC_OR_DEPARTMENT_OTHER)
Admission: EM | Admit: 2017-03-29 | Discharge: 2017-03-29 | Disposition: A | Discharge status: Home / Self Care | Payer: Self-pay | Attending: Emergency Medicine | Admitting: Emergency Medicine

## 2017-03-29 DIAGNOSIS — G43909 Migraine, unspecified, not intractable, without status migrainosus: Secondary | ICD-10-CM | POA: Insufficient documentation

## 2017-03-29 DIAGNOSIS — Z87891 Personal history of nicotine dependence: Secondary | ICD-10-CM | POA: Insufficient documentation

## 2017-03-29 MED ORDER — METOCLOPRAMIDE HCL 5 MG/ML IJ SOLN
10.0000 mg | Freq: Once | INTRAMUSCULAR | Status: AC
  Administered 2017-03-29: 10 mg via INTRAVENOUS
  Filled 2017-03-29: qty 2

## 2017-03-29 MED ORDER — SODIUM CHLORIDE 0.9 % IV BOLUS (SEPSIS)
500.0000 mL | Freq: Once | INTRAVENOUS | Status: AC
  Administered 2017-03-29: 18:00:00 via INTRAVENOUS

## 2017-03-29 MED ORDER — KETOROLAC TROMETHAMINE 30 MG/ML IJ SOLN
30.0000 mg | Freq: Once | INTRAMUSCULAR | Status: AC
  Administered 2017-03-29: 30 mg via INTRAVENOUS
  Filled 2017-03-29: qty 1

## 2017-03-29 NOTE — ED Provider Notes (Signed)
[facility]   CSN: [id number] Arrival date & time: 03/29/17  1721  By signing my name below, I, Linna Darner, attest that this documentation has been prepared under the direction and in the presence of physician practitioner, Benjiman Core, MD. Electronically Signed: Linna Darner, Scribe. 03/29/2017. 5:47 PM.  History   Chief Complaint Chief Complaint  Patient presents with  . Migraine   The history is provided by the patient. No language interpreter was used.    HPI Comments: LEVEL 5 CAVEAT DUE TO DIFFICULTY SPEAKING Grace Bishop is a 31 y.o. female with PMHx including migraines who presents to the Emergency Department complaining of a constant headache consistent with her past migraines beginning around 5 PM today. She endorses associated photophobia, phonophobia, nausea, and some vomiting. She reports difficulty speaking and confusion due to the severity of her headache and notes these symptoms are typically associated with her migraines. Patient tried promethazine, baclofen, and ibuprofen PTA without improvement of her headache. She denies any recent falls or trauma to her head. No other complaints noted at this time.  Past Medical History:  Diagnosis Date  . Anxiety   . Lupus    joint pain  . Migraines   . Ovarian cyst   . Thyroid disease     Patient Active Problem List   Diagnosis Date Noted  . Acute intractable headache   . Altered mental status   . Encephalopathy 12/24/2016    Past Surgical History:  Procedure Laterality Date  . OVARIAN CYST REMOVAL    . TONSILLECTOMY      OB History    No data available       Home Medications    Prior to Admission medications   Medication Sig Start Date End Date Taking? Authorizing Provider  baclofen (LIORESAL) 10 MG tablet Take 10 mg by mouth 3 (three) times daily.    [provider]  diphenhydrAMINE (BENADRYL) 25 MG tablet Take 1 tablet (25 mg total) by mouth every 6 (six)  hours. 12/30/16   Jerelyn Scott, MD  hydroxychloroquine (PLAQUENIL) 200 MG tablet Take 200 mg by mouth daily.    [provider]  ondansetron (ZOFRAN) 4 MG tablet Take 4 mg by mouth daily as needed for nausea or vomiting.  12/13/16   [provider]  promethazine (PHENERGAN) 25 MG tablet Take 25 mg by mouth every 6 (six) hours as needed for nausea or vomiting.    [provider]    Family History Family History  Problem Relation Age of Onset  . Thyroid disease Mother   . Drug abuse Father   . Sickle cell trait Sister     Social History Social History  Substance Use Topics  . Smoking status: Former Smoker    Years: 2.00  . Smokeless tobacco: Never Used  . Alcohol use No     Allergies   Patient has no known allergies.   Review of Systems Review of Systems  Unable to perform ROS: Other (difficulty speaking)   Physical Exam Updated Vital Signs BP 122/79 (BP Location: Left Arm)   Pulse (!) 101   Temp 98.6 F (37 C) (Oral)   Resp 12   LMP 03/06/2017 (Exact Date)   SpO2 100%   Physical Exam  Constitutional: She appears well-developed and well-nourished. No distress.  HENT:  Head: Normocephalic and atraumatic.  Eyes: Conjunctivae and EOM are normal.  Neck: Neck supple. No tracheal deviation present.  Cardiovascular: Normal rate.   Pulmonary/Chest: Effort normal.  No respiratory distress.  Musculoskeletal: Normal range of motion.  Neurological: She is alert.  Skin: Skin is warm and dry.  Psychiatric: She has a normal mood and affect.  Nursing note and vitals reviewed.  ED Treatments / Results  Labs (all labs ordered are listed, but only abnormal results are displayed) Labs Reviewed - No data to display  EKG  EKG Interpretation None       Radiology No results found.  Procedures Procedures (including critical care time)  DIAGNOSTIC STUDIES: Oxygen Saturation is 100% on RA, normal by my interpretation.    Medications Ordered in  ED Medications  sodium chloride 0.9 % bolus 500 mL (0 mLs Intravenous Stopped 03/29/17 1819)  ketorolac (TORADOL) 30 MG/ML injection 30 mg (30 mg Intravenous Given 03/29/17 1755)  metoCLOPramide (REGLAN) injection 10 mg (10 mg Intravenous Given 03/29/17 1755)     Initial Impression / Assessment and Plan / ED Course  I have reviewed the triage vital signs and the nursing notes.  Pertinent labs & imaging results that were available during my care of the patient were reviewed by me and considered in my medical decision making (see chart for details).     Patient with migraine. History of same. Initially had difficulty speaking that cleared up with treatment. Feels better after the treatment. Has been worked up for migraines the past. Discharge home.  Final Clinical Impressions(s) / ED Diagnoses   Final diagnoses:  Migraine without status migrainosus, not intractable, unspecified migraine type    New Prescriptions New Prescriptions   No medications on file   I personally performed the services described in this documentation, which was scribed in my presence. The recorded information has been reviewed and is accurate.      Benjiman CorePickering, Piotr Christopher, MD 03/29/17 1901

## 2017-03-29 NOTE — ED Notes (Signed)
ED Provider at bedside. 

## 2017-03-29 NOTE — ED Notes (Signed)
amb to BR w/o difficulty 

## 2017-03-29 NOTE — ED Triage Notes (Addendum)
PResents with onset of migraine that is typical of her migraines that began around 5:10 today. PT reports flashing lights, dizziness, blurry vision, nausea and severe head aching that is causing her difficulty in speaking because it hurts to talk.  Pt is mumbling and difficult to understand. Photophobia and sound sensitivity.  She reports that she tried promethazine and baclofen and 3 motrin  at home 10 minutes ago. She states she doesn't want to get decadron.

## 2017-04-01 ENCOUNTER — Emergency Department (HOSPITAL_BASED_OUTPATIENT_CLINIC_OR_DEPARTMENT_OTHER)
Admission: EM | Admit: 2017-04-01 | Discharge: 2017-04-01 | Disposition: A | Discharge status: Home / Self Care | Payer: Self-pay | Attending: Emergency Medicine | Admitting: Emergency Medicine

## 2017-04-01 ENCOUNTER — Encounter (HOSPITAL_BASED_OUTPATIENT_CLINIC_OR_DEPARTMENT_OTHER): Payer: Self-pay | Admitting: Emergency Medicine

## 2017-04-01 ENCOUNTER — Emergency Department (HOSPITAL_BASED_OUTPATIENT_CLINIC_OR_DEPARTMENT_OTHER)
Admission: EM | Admit: 2017-04-01 | Discharge: 2017-04-01 | Disposition: A | Discharge status: Home / Self Care | Payer: Self-pay | Attending: Dermatology | Admitting: Dermatology

## 2017-04-01 DIAGNOSIS — R51 Headache: Secondary | ICD-10-CM | POA: Insufficient documentation

## 2017-04-01 DIAGNOSIS — R519 Headache, unspecified: Secondary | ICD-10-CM

## 2017-04-01 DIAGNOSIS — H53149 Visual discomfort, unspecified: Secondary | ICD-10-CM | POA: Insufficient documentation

## 2017-04-01 DIAGNOSIS — Z79899 Other long term (current) drug therapy: Secondary | ICD-10-CM | POA: Insufficient documentation

## 2017-04-01 DIAGNOSIS — Z87891 Personal history of nicotine dependence: Secondary | ICD-10-CM | POA: Insufficient documentation

## 2017-04-01 DIAGNOSIS — Z5321 Procedure and treatment not carried out due to patient leaving prior to being seen by health care provider: Secondary | ICD-10-CM | POA: Insufficient documentation

## 2017-04-01 LAB — PREGNANCY, URINE: Preg Test, Ur: NEGATIVE

## 2017-04-01 MED ORDER — METOCLOPRAMIDE HCL 10 MG PO TABS: 10.0000 mg | ORAL_TABLET | Freq: Three times a day (TID) | ORAL | 0 refills | Status: DC | PRN

## 2017-04-01 MED ORDER — SODIUM CHLORIDE 0.9 % IV BOLUS (SEPSIS)
1000.0000 mL | Freq: Once | INTRAVENOUS | Status: AC
  Administered 2017-04-01: 1000 mL via INTRAVENOUS

## 2017-04-01 MED ORDER — ONDANSETRON 4 MG PO TBDP
4.0000 mg | ORAL_TABLET | Freq: Once | ORAL | Status: AC
  Administered 2017-04-01: 4 mg via ORAL
  Filled 2017-04-01: qty 1

## 2017-04-01 MED ORDER — METOCLOPRAMIDE HCL 5 MG/ML IJ SOLN
10.0000 mg | Freq: Once | INTRAMUSCULAR | Status: AC
  Administered 2017-04-01: 10 mg via INTRAVENOUS
  Filled 2017-04-01: qty 2

## 2017-04-01 MED ORDER — KETOROLAC TROMETHAMINE 30 MG/ML IJ SOLN
15.0000 mg | Freq: Once | INTRAMUSCULAR | Status: AC
  Administered 2017-04-01: 15 mg via INTRAVENOUS
  Filled 2017-04-01: qty 1

## 2017-04-01 NOTE — Discharge Instructions (Signed)
Please take your headache medications to help with her symptoms. Please do not take the Zofran or the promethazine with the Reglan. Please call to follow-up with your primary doctor to discuss further headache management strategies. If any symptoms change or worsen, please return to the nearest emergency department.

## 2017-04-01 NOTE — ED Notes (Signed)
Pt states she is ready to go home. MD made aware.

## 2017-04-01 NOTE — ED Triage Notes (Signed)
Headache x 3 hours. Pt with hx of migraines. Pt took regular headache meds with no relief.

## 2017-04-01 NOTE — ED Notes (Signed)
Pt. Came to [facility] and stated she was going to go home and sleep the headache off.  [facility] offered Pt. Information on the wait time and number of Pt.s in lobby and that there were several Pt.s being discharged and she would not have long before being seen.  Pt. Said she was going on home to sleep and would return if she did not feel any better.  RN [facility].

## 2017-04-01 NOTE — ED Notes (Signed)
ED Provider at bedside. 

## 2017-04-01 NOTE — ED Provider Notes (Signed)
[facility] Provider Note   CSN: [id number] Arrival date & time: 04/01/17  1606  By signing my name below, I, Bing NeighborsMaurice Deon Copeland Jr., attest that this documentation has been prepared under the direction and in the presence of Tegeler, Canary Brimhristopher J, *. Electronically signed: Bing NeighborsMaurice Deon Copeland Jr., ED Scribe. 04/01/17. 7:03 PM.   History   Chief Complaint Chief Complaint  Patient presents with  . Headache    HPI Grace Bishop is a 31 y.o. female with hx of migraines, Lupus who presents to the Emergency Department complaining of headache with onset x3 hours. Pt states that she has had intermittent headaches every x2 days for the past x1 month, but x3 hours ago she suddenly developed a worsening L sided headache. She states that this feels similar to her past migraines. Pt reports her last migraine of this degree being x1 year ago. Pt rates the pain currently at 6.5/10. She also complains of intermittent flashing in the R eye for the past x1 month. Pt states that she was recently seen by the opthalmologist and was told that she had retinal detachment. She reports photophobia, L sided headache, vomiting. Pt has taken Ibuprofen and Promethazine with no relief. She denies dysuria, frequency, blurred vision, double vision, nausea. Pt denies hx of head injury. Of note, pt states that Toradol works best for her migraines.     The history is provided by the patient. No language interpreter was used.  Migraine  This is a recurrent problem. The current episode started 3 to 5 hours ago. The problem occurs constantly. The problem has not changed since onset.Associated symptoms include headaches. Pertinent negatives include no chest pain, no abdominal pain and no shortness of breath. Nothing aggravates the symptoms. Nothing relieves the symptoms. Treatments tried: home meds. The treatment provided no relief.    Past Medical History:  Diagnosis Date  . Anxiety   . Lupus    joint pain   . Migraines   . Ovarian cyst   . Thyroid disease     Patient Active Problem List   Diagnosis Date Noted  . Acute intractable headache   . Altered mental status   . Encephalopathy 12/24/2016    Past Surgical History:  Procedure Laterality Date  . OVARIAN CYST REMOVAL    . TONSILLECTOMY      OB History    No data available       Home Medications    Prior to Admission medications   Medication Sig Start Date End Date Taking? Authorizing Provider  baclofen (LIORESAL) 10 MG tablet Take 10 mg by mouth 3 (three) times daily.    [provider]  diphenhydrAMINE (BENADRYL) 25 MG tablet Take 1 tablet (25 mg total) by mouth every 6 (six) hours. 12/30/16   Jerelyn ScottLinker, Martha, MD  hydroxychloroquine (PLAQUENIL) 200 MG tablet Take 200 mg by mouth daily.    [provider]  ondansetron (ZOFRAN) 4 MG tablet Take 4 mg by mouth daily as needed for nausea or vomiting.  12/13/16   [provider]  promethazine (PHENERGAN) 25 MG tablet Take 25 mg by mouth every 6 (six) hours as needed for nausea or vomiting.    [provider]    Family History Family History  Problem Relation Age of Onset  . Thyroid disease Mother   . Drug abuse Father   . Sickle cell trait Sister     Social History Social History  Substance Use Topics  . Smoking status: Former Smoker  Years: 2.00  . Smokeless tobacco: Never Used  . Alcohol use No     Allergies   Patient has no known allergies.   Review of Systems Review of Systems  Constitutional: Negative for chills, fatigue and fever.  HENT: Negative for congestion.   Eyes: Positive for photophobia. Negative for visual disturbance.  Respiratory: Negative for chest tightness and shortness of breath.   Cardiovascular: Negative for chest pain.  Gastrointestinal: Positive for nausea and vomiting. Negative for abdominal pain, constipation and diarrhea.  Genitourinary: Negative for dysuria, flank pain and frequency.  Skin:  Negative for rash and wound.  Neurological: Positive for headaches. Negative for syncope, weakness, light-headedness and numbness.  Psychiatric/Behavioral: Negative for confusion.  All other systems reviewed and are negative.    Physical Exam Updated Vital Signs BP 107/75   Pulse 86   Temp 98.2 F (36.8 C) (Oral)   Resp 20   Ht 5\' 2"  (1.575 m)   Wt 110 lb (49.9 kg)   LMP 03/06/2017 (Exact Date)   SpO2 100%   BMI 20.12 kg/m   Physical Exam  Constitutional: She is oriented to person, place, and time. She appears well-developed and well-nourished. No distress.  HENT:  Head: Normocephalic and atraumatic.  Nose: Nose normal.  Mouth/Throat: Oropharynx is clear and moist. No oropharyngeal exudate.  Eyes: Conjunctivae and EOM are normal. Pupils are equal, round, and reactive to light.  Severe photophobia.   Neck: Normal range of motion. Neck supple.  Cardiovascular: Normal rate and regular rhythm.   No murmur heard. Pulmonary/Chest: Effort normal and breath sounds normal. No respiratory distress. She has no rales. She exhibits no tenderness.  Abdominal: Soft. There is no tenderness.  Musculoskeletal: She exhibits no edema or tenderness.  Neurological: She is alert and oriented to person, place, and time. She is not disoriented. She displays no tremor and normal reflexes. No cranial nerve deficit or sensory deficit. She exhibits normal muscle tone. Coordination normal. GCS eye subscore is 4. GCS verbal subscore is 5. GCS motor subscore is 6.  Skin: Skin is warm and dry. Capillary refill takes less than 2 seconds. No rash noted. She is not diaphoretic. No erythema.  Psychiatric: She has a normal mood and affect.  Nursing note and vitals reviewed.    ED Treatments / Results   DIAGNOSTIC STUDIES: Oxygen Saturation is 100% on RA, normal by my interpretation.   COORDINATION OF CARE: 7:03 PM-Discussed next steps with pt. Pt verbalized understanding and is agreeable with the plan.      Labs (all labs ordered are listed, but only abnormal results are displayed) Labs Reviewed  PREGNANCY, URINE    EKG  EKG Interpretation None       Radiology No results found.  Procedures Procedures (including critical care time)  Medications Ordered in ED Medications  ondansetron (ZOFRAN-ODT) disintegrating tablet 4 mg (4 mg Oral Given 04/01/17 1833)  sodium chloride 0.9 % bolus 1,000 mL (0 mLs Intravenous Stopped 04/01/17 2026)  metoCLOPramide (REGLAN) injection 10 mg (10 mg Intravenous Given 04/01/17 2022)  ketorolac (TORADOL) 30 MG/ML injection 15 mg (15 mg Intravenous Given 04/01/17 2022)     Initial Impression / Assessment and Plan / ED Course  I have reviewed the triage vital signs and the nursing notes.  Pertinent labs & imaging results that were available during my care of the patient were reviewed by me and considered in my medical decision making (see chart for details).     Zona Pedro is a 30 y.o.  female With a past medical history significant for migraines and lupus who presents with several hours of severe headache. Patient reports it feels like prior migraines. Patient reports associated photophobia and photophobia. She has some mild nausea and vomiting. No neurologic complaints otherwise.  History and exam are seen above. Patient's physical exam is completely unremarkable. No nuchal rigidity or neck tenderness. Normal neurologic exam. Photophobia present. Lungs clear and abdomen nontender.  Patient reports that Reglan and Toradol and fluids help her symptoms. Pregnancy test was confirmed negative than these medications were ordered.  Patient reported complete resolution of headache and wishing to be discharged. Given resolution of symptoms and similarity to prior, do not feel patient has any further workup needed. Patient instructed to follow up with PCP for further management. Patient had no other questions or concerns and was discharged in good condition  with understanding of return precautions and follow instructions.   Final Clinical Impressions(s) / ED Diagnoses   Final diagnoses:  Bad headache    New Prescriptions Discharge Medication List as of 04/01/2017  9:08 PM    START taking these medications   Details  metoCLOPramide (REGLAN) 10 MG tablet Take 1 tablet (10 mg total) by mouth every 8 (eight) hours as needed for nausea., Starting Thu 04/01/2017, Print       I personally performed the services described in this documentation, which was scribed in my presence. The recorded information has been reviewed and is accurate.  Clinical Impression: 1. Bad headache     Disposition: Discharge  Condition: Good  I have discussed the results, Dx and Tx plan with the pt(& family if present). He/she/they expressed understanding and agree(s) with the plan. Discharge instructions discussed at great length. Strict return precautions discussed and pt &/or family have verbalized understanding of the instructions. No further questions at time of discharge.    Discharge Medication List as of 04/01/2017  9:08 PM    START taking these medications   Details  metoCLOPramide (REGLAN) 10 MG tablet Take 1 tablet (10 mg total) by mouth every 8 (eight) hours as needed for nausea., Starting Thu 04/01/2017, Print        Follow Up: [facility] [address] [place] [address] [phone] Schedule an appointment as soon as possible for a visit    Aubery Lapping, [place] [address] [facility] [place] [address] [phone]     [facility] EMERGENCY DEPARTMENT [address] [id number] [address] [place] [address] [phone]  If symptoms worsen      Tegeler, Canary Brim, MD 04/02/17 1419

## 2017-05-01 ENCOUNTER — Emergency Department (HOSPITAL_BASED_OUTPATIENT_CLINIC_OR_DEPARTMENT_OTHER)
Admission: EM | Admit: 2017-05-01 | Discharge: 2017-05-01 | Disposition: A | Discharge status: Home / Self Care | Payer: Self-pay | Attending: Emergency Medicine | Admitting: Emergency Medicine

## 2017-05-01 ENCOUNTER — Encounter (HOSPITAL_BASED_OUTPATIENT_CLINIC_OR_DEPARTMENT_OTHER): Payer: Self-pay | Admitting: Emergency Medicine

## 2017-05-01 DIAGNOSIS — R1011 Right upper quadrant pain: Secondary | ICD-10-CM | POA: Insufficient documentation

## 2017-05-01 LAB — URINALYSIS, ROUTINE W REFLEX MICROSCOPIC
BILIRUBIN URINE: NEGATIVE
GLUCOSE, UA: NEGATIVE mg/dL
HGB URINE DIPSTICK: NEGATIVE
Ketones, ur: NEGATIVE mg/dL
Leukocytes, UA: NEGATIVE
Nitrite: NEGATIVE
PROTEIN: NEGATIVE mg/dL
Specific Gravity, Urine: 1.004 — ABNORMAL LOW (ref 1.005–1.030)
pH: 7.5 (ref 5.0–8.0)

## 2017-05-01 LAB — PREGNANCY, URINE: PREG TEST UR: NEGATIVE

## 2017-05-01 NOTE — ED Triage Notes (Signed)
PT presents with complaints of RUQ abdominal pain that started about 10 minutes ago.

## 2017-06-17 ENCOUNTER — Encounter (HOSPITAL_BASED_OUTPATIENT_CLINIC_OR_DEPARTMENT_OTHER): Payer: Self-pay

## 2017-06-17 ENCOUNTER — Emergency Department (HOSPITAL_BASED_OUTPATIENT_CLINIC_OR_DEPARTMENT_OTHER)
Admission: EM | Admit: 2017-06-17 | Discharge: 2017-06-17 | Disposition: A | Discharge status: Home / Self Care | Payer: Self-pay | Attending: Emergency Medicine | Admitting: Emergency Medicine

## 2017-06-17 DIAGNOSIS — Z87891 Personal history of nicotine dependence: Secondary | ICD-10-CM | POA: Insufficient documentation

## 2017-06-17 DIAGNOSIS — H43393 Other vitreous opacities, bilateral: Secondary | ICD-10-CM | POA: Insufficient documentation

## 2017-06-17 DIAGNOSIS — E079 Disorder of thyroid, unspecified: Secondary | ICD-10-CM | POA: Insufficient documentation

## 2017-06-17 MED ORDER — ONDANSETRON 4 MG PO TBDP
4.0000 mg | ORAL_TABLET | Freq: Once | ORAL | Status: AC
  Administered 2017-06-17: 4 mg via ORAL
  Filled 2017-06-17: qty 1

## 2017-06-17 NOTE — ED Notes (Signed)
Pt states that she feels better, gave her a ginger ale for the ride.  She verbalizes understanding of dc instructions and denies any further needs at this time

## 2017-06-17 NOTE — ED Notes (Signed)
Pt c/o floaters and flashing lights for several months.  She has already seen her opthalmalogist about this problem.  She is not in any distress, wears glasses.

## 2017-06-17 NOTE — ED Provider Notes (Signed)
[facility]   CSN: [id number] Arrival date & time: 06/17/17  1837  By signing my name below, I, Linna DarnerRussell Turner, attest that this documentation has been prepared under the direction and in the presence of physician practitioner, Raeford RazorKohut, Neisha Hinger, MD. Electronically Signed: Linna Darnerussell Turner, Scribe. 06/17/2017. 9:14 PM.  History   Chief Complaint Chief Complaint  Patient presents with  . Eye Problem   The history is provided by the patient. No language interpreter was used.    HPI Comments: Grace Bishop is a 31 y.o. female who presents to the Emergency Department for evaluation of a problem with her bilateral eyes for two months. She states that she intermittently sees flashing lights and floating objects. Patient additionally endorses occasional sharp eye pain bilaterally. She was advised by her eye specialist to come to the ED if she continued to see flashing lights out of concern for retinal detachment. Her symptoms are most significant prior to onset of her chronic migraines but she has also experienced her eye symptoms without the migraines. There are no alleviating factors noted. Patient denies dizziness, lightheadedness, vomiting, or any other associated symptoms.  Past Medical History:  Diagnosis Date  . Anxiety   . Lupus    joint pain  . Migraines   . Ovarian cyst   . Thyroid disease     Patient Active Problem List   Diagnosis Date Noted  . Acute intractable headache   . Altered mental status   . Encephalopathy 12/24/2016    Past Surgical History:  Procedure Laterality Date  . OVARIAN CYST REMOVAL    . TONSILLECTOMY      OB History    No data available       Home Medications    Prior to Admission medications   Not on File    Family History Family History  Problem Relation Age of Onset  . Thyroid disease Mother   . Drug abuse Father   . Sickle cell trait Sister     Social History Social History  Substance Use Topics  .  Smoking status: Former Smoker    Years: 2.00  . Smokeless tobacco: Never Used  . Alcohol use No     Allergies   Patient has no known allergies.   Review of Systems Review of Systems  All other systems reviewed and are negative.  Physical Exam Updated Vital Signs BP 109/72 (BP Location: Right Arm)   Pulse 80   Temp 98.6 F (37 C) (Oral)   Resp 18   Ht 5\' 2"  (1.575 m)   Wt 119 lb (54 kg)   LMP 06/04/2017   SpO2 100%   BMI 21.77 kg/m   Physical Exam  Constitutional: She appears well-developed and well-nourished.  HENT:  Head: Normocephalic.  Right Ear: External ear normal.  Left Ear: External ear normal.  Nose: Nose normal.  Mouth/Throat: Oropharynx is clear and moist.  Eyes: Pupils are equal, round, and reactive to light. Conjunctivae and EOM are normal. Right eye exhibits no discharge. Left eye exhibits no discharge.  Periorbital tissues are normal. PERRL. Anterior chambers clear. US w/o retinal detachment, vitreous hemorrhage, large floaters, lens dislocation.   Neck: Normal range of motion.  Cardiovascular: Normal rate, regular rhythm and normal heart sounds.   No murmur heard. Pulmonary/Chest: Effort normal and breath sounds normal. No respiratory distress. She has no wheezes. She has no rales.  Abdominal: Soft. She exhibits no distension. There is no tenderness. There is no rebound and no guarding.  Musculoskeletal: Normal range of motion. She exhibits no edema or tenderness.  Neurological: She is alert. No cranial nerve deficit. Coordination normal.  Skin: Skin is warm and dry. No rash noted. No erythema. No pallor.  Psychiatric: She has a normal mood and affect. Her behavior is normal.  Nursing note and vitals reviewed.  ED Treatments / Results  Labs (all labs ordered are listed, but only abnormal results are displayed) Labs Reviewed - No data to display  EKG  EKG Interpretation None       Radiology No results found.  Procedures Procedures  (including critical care time)  DIAGNOSTIC STUDIES: Oxygen Saturation is 100% on RA, normal by my interpretation.    COORDINATION OF CARE: 9:09 PM Discussed treatment plan with pt at bedside and pt agreed to plan.  Medications Ordered in ED Medications  ondansetron (ZOFRAN-ODT) disintegrating tablet 4 mg (4 mg Oral Given 06/17/17 2044)     Initial Impression / Assessment and Plan / ED Course  I have reviewed the triage vital signs and the nursing notes.  Pertinent labs & imaging results that were available during my care of the patient were reviewed by me and considered in my medical decision making (see chart for details).     30yF with floaters/flashing in eyes for months. No retinal detachment or large floaters noted on bedside US. She reports she has already seen ophthalmologist for the same. I'm not sure what more I can add today. It has been determined that no acute conditions requiring further emergency intervention are present at this time. The patient has been advised of the diagnosis and plan. I reviewed any labs and imaging including any potential incidental findings. We have discussed signs and symptoms that warrant return to the ED and they are listed in the discharge instructions.    Final Clinical Impressions(s) / ED Diagnoses   Final diagnoses:  Vitreous floaters of both eyes    New Prescriptions New Prescriptions   No medications on file   I personally preformed the services scribed in my presence. The recorded information has been reviewed is accurate. Raeford RazorStephen Marcellene Shivley, MD.    Raeford RazorKohut, Ayssa Bentivegna, MD 07/04/17 1800

## 2017-06-17 NOTE — Discharge Instructions (Signed)
Follow-up with your ophthalmologist.

## 2017-06-17 NOTE — ED Triage Notes (Signed)
C/o visual disturbances x "months"-was seen by eye doctor in June for c/o-no dx-worse x 2 weeks-NAD-steady gait

## 2017-06-17 NOTE — ED Notes (Signed)
Pt ran to bathroom and vomited, states she may have eaten something that upset her stomach.

## 2017-07-25 ENCOUNTER — Emergency Department (HOSPITAL_BASED_OUTPATIENT_CLINIC_OR_DEPARTMENT_OTHER): Payer: Self-pay

## 2017-07-25 ENCOUNTER — Encounter (HOSPITAL_BASED_OUTPATIENT_CLINIC_OR_DEPARTMENT_OTHER): Payer: Self-pay | Admitting: Emergency Medicine

## 2017-07-25 ENCOUNTER — Emergency Department (HOSPITAL_BASED_OUTPATIENT_CLINIC_OR_DEPARTMENT_OTHER)
Admission: EM | Admit: 2017-07-25 | Discharge: 2017-07-25 | Disposition: A | Discharge status: Home / Self Care | Payer: Self-pay | Attending: Emergency Medicine | Admitting: Emergency Medicine

## 2017-07-25 DIAGNOSIS — Z87891 Personal history of nicotine dependence: Secondary | ICD-10-CM | POA: Insufficient documentation

## 2017-07-25 DIAGNOSIS — M791 Myalgia, unspecified site: Secondary | ICD-10-CM

## 2017-07-25 DIAGNOSIS — R5383 Other fatigue: Secondary | ICD-10-CM | POA: Insufficient documentation

## 2017-07-25 DIAGNOSIS — Z79899 Other long term (current) drug therapy: Secondary | ICD-10-CM | POA: Insufficient documentation

## 2017-07-25 LAB — URINALYSIS, ROUTINE W REFLEX MICROSCOPIC
BILIRUBIN URINE: NEGATIVE
Glucose, UA: NEGATIVE mg/dL
Hgb urine dipstick: NEGATIVE
Ketones, ur: NEGATIVE mg/dL
Leukocytes, UA: NEGATIVE
NITRITE: NEGATIVE
Protein, ur: NEGATIVE mg/dL
SPECIFIC GRAVITY, URINE: 1.015 (ref 1.005–1.030)
pH: 7 (ref 5.0–8.0)

## 2017-07-25 LAB — CBC WITH DIFFERENTIAL/PLATELET
BASOS ABS: 0 10*3/uL (ref 0.0–0.1)
Basophils Relative: 0 %
Eosinophils Absolute: 0 10*3/uL (ref 0.0–0.7)
Eosinophils Relative: 2 %
HCT: 30.6 % — ABNORMAL LOW (ref 36.0–46.0)
HEMOGLOBIN: 10.2 g/dL — AB (ref 12.0–15.0)
LYMPHS ABS: 0.8 10*3/uL (ref 0.7–4.0)
Lymphocytes Relative: 36 %
MCH: 27.3 pg (ref 26.0–34.0)
MCHC: 33.3 g/dL (ref 30.0–36.0)
MCV: 81.8 fL (ref 78.0–100.0)
MONOS PCT: 21 %
Monocytes Absolute: 0.5 10*3/uL (ref 0.1–1.0)
Neutro Abs: 1 10*3/uL — ABNORMAL LOW (ref 1.7–7.7)
Neutrophils Relative %: 41 %
Platelets: 220 10*3/uL (ref 150–400)
RBC: 3.74 MIL/uL — ABNORMAL LOW (ref 3.87–5.11)
RDW: 12.4 % (ref 11.5–15.5)
WBC: 2.3 10*3/uL — ABNORMAL LOW (ref 4.0–10.5)

## 2017-07-25 LAB — PREGNANCY, URINE: PREG TEST UR: NEGATIVE

## 2017-07-25 LAB — BASIC METABOLIC PANEL
Anion gap: 5 (ref 5–15)
BUN: 8 mg/dL (ref 6–20)
CALCIUM: 8.9 mg/dL (ref 8.9–10.3)
CHLORIDE: 106 mmol/L (ref 101–111)
CO2: 24 mmol/L (ref 22–32)
CREATININE: 0.44 mg/dL (ref 0.44–1.00)
GFR calc non Af Amer: >60 mL/min (ref >60)
Glucose, Bld: 102 mg/dL — ABNORMAL HIGH (ref 65–99)
Potassium: 3.3 mmol/L — ABNORMAL LOW (ref 3.5–5.1)
SODIUM: 135 mmol/L (ref 135–145)

## 2017-07-25 MED ORDER — KETOROLAC TROMETHAMINE 30 MG/ML IJ SOLN
30.0000 mg | Freq: Once | INTRAMUSCULAR | Status: AC
  Administered 2017-07-25: 30 mg via INTRAVENOUS
  Filled 2017-07-25: qty 1

## 2017-07-25 NOTE — ED Provider Notes (Signed)
[facility]   CSN: [id number] Arrival date & time: 07/25/17  1305     History   Chief Complaint Chief Complaint  Patient presents with  . Generalized Body Aches    HPI Grace Bishop is a 31 y.o. female.  Patient with history of lupus, on Plaquenil, presents with multiple symptoms over the past 1-2 weeks. She complains of some chest tightness and generalized body aches described as "burning in my skin". She has had some dysuria yesterday but not today. She has had some generalized chills and feeling hot but no documented fevers. Also, generalized fatigue. The worst symptom she states is not being able to sleep at night. She has felt like this in the past. She is uncertain if this is related to lupus or not. She denies any ear pain. She has had some nasal congestion and runny nose. No sore throat or cough. No skin rashes or tick bites. No vomiting or diarrhea. The onset of this condition was acute. The course is persistent. Aggravating factors: none. Alleviating factors: none.        Past Medical History:  Diagnosis Date  . Anxiety   . Lupus    joint pain  . Migraines   . Ovarian cyst   . Thyroid disease     Patient Active Problem List   Diagnosis Date Noted  . Acute intractable headache   . Altered mental status   . Encephalopathy 12/24/2016    Past Surgical History:  Procedure Laterality Date  . OVARIAN CYST REMOVAL    . TONSILLECTOMY      OB History    No data available       Home Medications    Prior to Admission medications   Medication Sig Start Date End Date Taking? Authorizing Provider  hydroxychloroquine (PLAQUENIL) 200 MG tablet Take by mouth daily.   Yes [provider]  metoCLOPramide (REGLAN) 10 MG tablet Take 10 mg by mouth 4 (four) times daily.   Yes [provider]    Family History Family History  Problem Relation Age of Onset  . Thyroid disease Mother   . Drug abuse Father   . Sickle cell  trait Sister     Social History Social History  Substance Use Topics  . Smoking status: Former Smoker    Years: 2.00  . Smokeless tobacco: Never Used  . Alcohol use No     Allergies   Patient has no known allergies.   Review of Systems Review of Systems  Constitutional: Positive for chills and fatigue. Negative for fever.  HENT: Positive for congestion and rhinorrhea. Negative for sore throat.   Eyes: Negative for redness.  Respiratory: Positive for chest tightness. Negative for cough and shortness of breath.   Cardiovascular: Negative for chest pain.  Gastrointestinal: Negative for abdominal pain, diarrhea, nausea and vomiting.  Genitourinary: Negative for dysuria.  Musculoskeletal: Positive for myalgias.  Skin: Negative for rash.  Neurological: Positive for headaches.  Psychiatric/Behavioral: Positive for sleep disturbance.     Physical Exam Updated Vital Signs BP 103/71 (BP Location: Left Arm)   Pulse 72   Temp 98.9 F (37.2 C) (Oral)   Resp 20   Ht  (1.575 m)   Wt 54.4 kg (120 lb)   LMP 07/01/2017   SpO2 100%   BMI 21.95 kg/m   Physical Exam  Constitutional: She appears well-developed and well-nourished.  HENT:  Head: Normocephalic and atraumatic.  Right Ear: Tympanic membrane, external ear and ear  canal normal.  Left Ear: Tympanic membrane, external ear and ear canal normal.  Nose: Nose normal. No mucosal edema or rhinorrhea.  Mouth/Throat: Uvula is midline, oropharynx is clear and moist and mucous membranes are normal. Mucous membranes are not dry. No oral lesions. No trismus in the jaw. No uvula swelling. No oropharyngeal exudate, posterior oropharyngeal edema, posterior oropharyngeal erythema or tonsillar abscesses.  Eyes: Conjunctivae are normal. Right eye exhibits no discharge. Left eye exhibits no discharge.  Neck: Normal range of motion. Neck supple.  Cardiovascular: Normal rate, regular rhythm and normal heart sounds.   Pulmonary/Chest:  Effort normal and breath sounds normal. No respiratory distress. She has no wheezes. She has no rales.  Abdominal: Soft. There is no tenderness.  Lymphadenopathy:    She has no cervical adenopathy.  Neurological: She is alert.  Skin: Skin is warm and dry.  Psychiatric: She has a normal mood and affect.  Nursing note and vitals reviewed.    ED Treatments / Results  Labs (all labs ordered are listed, but only abnormal results are displayed) Labs Reviewed  CBC WITH DIFFERENTIAL/PLATELET - Abnormal; Notable for the following:       Result Value   WBC 2.3 (*)    RBC 3.74 (*)    Hemoglobin 10.2 (*)    HCT 30.6 (*)    Neutro Abs 1.0 (*)    All other components within normal limits  BASIC METABOLIC PANEL - Abnormal; Notable for the following:    Potassium 3.3 (*)    Glucose, Bld 102 (*)    All other components within normal limits  URINALYSIS, ROUTINE W REFLEX MICROSCOPIC  PREGNANCY, URINE  PATHOLOGIST SMEAR REVIEW    Radiology Dg Chest 2 View  Result Date: 07/25/2017 CLINICAL DATA:  Patient with history of lupus.  Chest pain/pressure. EXAM: CHEST  2 VIEW COMPARISON:  Chest radiograph 12/24/2016 FINDINGS: Normal cardiac and mediastinal contours. No consolidative pulmonary opacities. No pleural effusion or pneumothorax. Regional skeleton is unremarkable. IMPRESSION: No acute cardiopulmonary process. Electronically Signed   By: Annia Beltrew  Davis M.D.   On: 07/25/2017 13:54    Procedures Procedures (including critical care time)  Medications Ordered in ED Medications  ketorolac (TORADOL) 30 MG/ML injection 30 mg (30 mg Intravenous Given 07/25/17 1335)     Initial Impression / Assessment and Plan / ED Course  I have reviewed the triage vital signs and the nursing notes.  Pertinent labs & imaging results that were available during my care of the patient were reviewed by me and considered in my medical decision making (see chart for details).     Patient seen and examined. Work-up  initiated. Medications ordered.   Vital signs reviewed and are as follows: BP 103/71 (BP Location: Left Arm)   Pulse 72   Temp 98.9 F (37.2 C) (Oral)   Resp 20   Ht 5\' 2"  (1.575 m)   Wt 54.4 kg (120 lb)   LMP 07/01/2017   SpO2 100%   BMI 21.95 kg/m   3:55 PM Patient rechecked. We discussed lab results and workup. She is feeling a bit better.  Offered empiric trial of steroids to see if this helps with possible lupus flare. Patient declines at this point. She states she would like to follow-up with her primary caregivers for this.  Will discharge to home. Patient has Reglan to use for nausea. Encouraged return to the emergency department with worsening severe symptoms such as fever, persistent vomiting, localized abdominal pain, chest pain. Patient verbalizes understanding and  agrees with plan.  Final Clinical Impressions(s) / ED Diagnoses   Final diagnoses:  Myalgia  Fatigue, unspecified type   Patient with multiple vague symptoms of unclear etiology at this point. May or may not be related to her this. She has mild baseline leukopenia and anemia. No signs of infection on chest x-ray or in urine. No focal abdominal pain. No reported chest pains. We'll continue supportive management home with PCP follow-up. Return instructions as above. She appears well and is not in any distress during emergency department stay.    New Prescriptions Discharge Medication List as of 07/25/2017  3:44 PM       Renne Crigler, PA-C 07/25/17 1557    Lavera Guise, MD 07/25/17 [phone]

## 2017-07-25 NOTE — ED Triage Notes (Signed)
Patient states that she has had generalized pain and chest tightness for the last weeks "I think I have a lupas flair up: - patient reports that she is also having a burning feeling all over and fatigued and weak especially her eyes and feel hot allover  - patient also requesting a pregnancy test

## 2017-07-25 NOTE — ED Notes (Signed)
Pt discharged to home with family. NAD.  

## 2017-07-25 NOTE — Discharge Instructions (Signed)
Please read and follow all provided instructions.  Your diagnoses today include:  1. Myalgia   2. Fatigue, unspecified type     Tests performed today include:  Blood counts and electrolytes - shows persistently low white blood cell count and slightly red blood cell count  Urine test - no infection  Chest x-ray - no infection  Vital signs. See below for your results today.   Medications prescribed:   None  Take any prescribed medications only as directed.  Home care instructions:  Follow any educational materials contained in this packet.  BE VERY CAREFUL not to take multiple medicines containing Tylenol (also called acetaminophen). Doing so can lead to an overdose which can damage your liver and cause liver failure and possibly death.   Follow-up instructions: Please follow-up with your primary care provider in the next 3-5 days for further evaluation of your symptoms.   Return instructions:   Please return to the Emergency Department if you experience worsening symptoms.   Please return if you have any other emergent concerns.  Additional Information:  Your vital signs today were: BP 101/64 (BP Location: Left Arm)    Pulse 74    Temp 98.9 F (37.2 C) (Oral)    Resp 20    Ht 5\' 2"  (1.575 m)    Wt 54.4 kg (120 lb)    LMP 07/01/2017    SpO2 99%    BMI 21.95 kg/m  If your blood pressure (BP) was elevated above 135/85 this visit, please have this repeated by your doctor within one month. --------------

## 2017-07-26 LAB — PATHOLOGIST SMEAR REVIEW: PATH REVIEW: REACTIVE

## 2017-08-02 ENCOUNTER — Encounter (HOSPITAL_BASED_OUTPATIENT_CLINIC_OR_DEPARTMENT_OTHER): Payer: Self-pay

## 2017-08-02 DIAGNOSIS — M329 Systemic lupus erythematosus, unspecified: Secondary | ICD-10-CM | POA: Insufficient documentation

## 2017-08-02 DIAGNOSIS — G43009 Migraine without aura, not intractable, without status migrainosus: Secondary | ICD-10-CM | POA: Insufficient documentation

## 2017-08-02 DIAGNOSIS — Z79899 Other long term (current) drug therapy: Secondary | ICD-10-CM | POA: Insufficient documentation

## 2017-08-02 MED ORDER — ONDANSETRON 4 MG PO TBDP
4.0000 mg | ORAL_TABLET | Freq: Once | ORAL | Status: AC
  Administered 2017-08-02: 4 mg via ORAL
  Filled 2017-08-02: qty 1

## 2017-08-02 NOTE — ED Triage Notes (Signed)
C/o migraine, n/v x 2 hours-NAD-steady gait

## 2017-08-02 NOTE — ED Notes (Signed)
Pt requested/received zofran ODT

## 2017-08-03 ENCOUNTER — Emergency Department (HOSPITAL_BASED_OUTPATIENT_CLINIC_OR_DEPARTMENT_OTHER)
Admission: EM | Admit: 2017-08-03 | Discharge: 2017-08-03 | Disposition: A | Discharge status: Home / Self Care | Payer: Self-pay | Attending: Emergency Medicine | Admitting: Emergency Medicine

## 2017-08-03 DIAGNOSIS — G43009 Migraine without aura, not intractable, without status migrainosus: Secondary | ICD-10-CM

## 2017-08-03 MED ORDER — KETOROLAC TROMETHAMINE 30 MG/ML IJ SOLN
30.0000 mg | Freq: Once | INTRAMUSCULAR | Status: AC
  Administered 2017-08-03: 30 mg via INTRAVENOUS
  Filled 2017-08-03: qty 1

## 2017-08-03 MED ORDER — DIPHENHYDRAMINE HCL 50 MG/ML IJ SOLN
25.0000 mg | Freq: Once | INTRAMUSCULAR | Status: DC
  Filled 2017-08-03: qty 1

## 2017-08-03 MED ORDER — PROCHLORPERAZINE EDISYLATE 5 MG/ML IJ SOLN
10.0000 mg | Freq: Once | INTRAMUSCULAR | Status: AC
  Administered 2017-08-03: 10 mg via INTRAVENOUS
  Filled 2017-08-03: qty 2

## 2017-08-03 NOTE — ED Provider Notes (Signed)
[facility]   CSN: [id number] Arrival date & time: 08/02/17  2246     History   Chief Complaint Chief Complaint  Patient presents with  . Migraine    HPI Grace Bishop is a 31 y.o. female.  HPI  This is a 31 year old female with history of lupus, migraines who presents with headache. Patient reports onset of headache 2 hours prior to arrival. Headache is typical for her migraine just worse. She reports sharp pains in the back of her head. Current pain is 9 out of 10. She took Reglan at home with no relief. She reports associated nausea and vomiting. Reports photophobia. No vision changes. Denies any weakness, numbness, tingling or focal neurologic symptoms. Denies any fevers or neck pain.  Past Medical History:  Diagnosis Date  . Anxiety   . Lupus    joint pain  . Migraines   . Ovarian cyst   . Thyroid disease     Patient Active Problem List   Diagnosis Date Noted  . Acute intractable headache   . Altered mental status   . Encephalopathy 12/24/2016    Past Surgical History:  Procedure Laterality Date  . OVARIAN CYST REMOVAL    . TONSILLECTOMY      OB History    No data available       Home Medications    Prior to Admission medications   Medication Sig Start Date End Date Taking? Authorizing Provider  hydroxychloroquine (PLAQUENIL) 200 MG tablet Take by mouth daily.    [provider]  metoCLOPramide (REGLAN) 10 MG tablet Take 10 mg by mouth 4 (four) times daily.    [provider]    Family History Family History  Problem Relation Age of Onset  . Thyroid disease Mother   . Drug abuse Father   . Sickle cell trait Sister     Social History Social History  Substance Use Topics  . Smoking status: Former Smoker    Years: 2.00  . Smokeless tobacco: Never Used  . Alcohol use No     Allergies   Patient has no known allergies.   Review of Systems Review of Systems  Constitutional: Negative for  fever.  Respiratory: Negative for shortness of breath.   Cardiovascular: Negative for chest pain.  Gastrointestinal: Positive for nausea and vomiting.  Musculoskeletal: Negative for neck pain.  Neurological: Positive for headaches. Negative for dizziness, weakness and numbness.  All other systems reviewed and are negative.    Physical Exam Updated Vital Signs BP 110/70 (BP Location: Right Arm)   Pulse 74   Temp 98 F (36.7 C) (Oral)   Resp 17   Ht  (1.575 m)   Wt 54 kg (119 lb)   LMP 07/28/2017   SpO2 100%   BMI 21.77 kg/m   Physical Exam  Constitutional: She is oriented to person, place, and time. She appears well-developed and well-nourished. No distress.  HENT:  Head: Normocephalic and atraumatic.  Cardiovascular: Normal rate, regular rhythm and normal heart sounds.   No murmur heard. Pulmonary/Chest: Effort normal and breath sounds normal. No respiratory distress. She has no wheezes.  Neurological: She is alert and oriented to person, place, and time.  Cranial nerves II through XII intact, 5 out of 5 strength in all 4 studies, no dysmetria to finger-nose-finger  Skin: Skin is warm and dry.  Psychiatric: She has a normal mood and affect.  Nursing note and vitals reviewed.    ED Treatments / Results  Labs (all labs ordered are listed, but only abnormal results are displayed) Labs Reviewed - No data to display  EKG  EKG Interpretation None       Radiology No results found.  Procedures Procedures (including critical care time)  Medications Ordered in ED Medications  prochlorperazine (COMPAZINE) injection 10 mg (10 mg Intravenous Refused 08/03/17 0103)  diphenhydrAMINE (BENADRYL) injection 25 mg (25 mg Intravenous Refused 08/03/17 0103)  ondansetron (ZOFRAN-ODT) disintegrating tablet 4 mg (4 mg Oral Given 08/02/17 2311)  ketorolac (TORADOL) 30 MG/ML injection 30 mg (30 mg Intravenous Given 08/03/17 0101)     Initial Impression / Assessment and Plan /  ED Course  I have reviewed the triage vital signs and the nursing notes.  Pertinent labs & imaging results that were available during my care of the patient were reviewed by me and considered in my medical decision making (see chart for details).     Patient presents with headache consistent with prior migraines. She is nontoxic and nonfocal. Patient was given a migraine cocktail to include fluids, Toradol, Compazine, and Benadryl. No workup at this time. Doubt subarachnoid hemorrhage or infectious etiology. On recheck, patient states that she feels better.  After history, exam, and medical workup I feel the patient has been appropriately medically screened and is safe for discharge home. Pertinent diagnoses were discussed with the patient. Patient was given return precautions.   Final Clinical Impressions(s) / ED Diagnoses   Final diagnoses:  Migraine without aura and without status migrainosus, not intractable    New Prescriptions New Prescriptions   No medications on file     Shon Baton, MD 08/03/17 0401

## 2017-08-17 DIAGNOSIS — R87619 Unspecified abnormal cytological findings in specimens from cervix uteri: Secondary | ICD-10-CM | POA: Insufficient documentation

## 2017-09-24 ENCOUNTER — Emergency Department (HOSPITAL_BASED_OUTPATIENT_CLINIC_OR_DEPARTMENT_OTHER)
Admission: EM | Admit: 2017-09-24 | Discharge: 2017-09-24 | Disposition: A | Discharge status: Home / Self Care | Payer: Self-pay | Attending: Emergency Medicine | Admitting: Emergency Medicine

## 2017-09-24 ENCOUNTER — Encounter (HOSPITAL_BASED_OUTPATIENT_CLINIC_OR_DEPARTMENT_OTHER): Payer: Self-pay | Admitting: *Deleted

## 2017-09-24 DIAGNOSIS — Z87891 Personal history of nicotine dependence: Secondary | ICD-10-CM | POA: Insufficient documentation

## 2017-09-24 DIAGNOSIS — Z79899 Other long term (current) drug therapy: Secondary | ICD-10-CM | POA: Insufficient documentation

## 2017-09-24 DIAGNOSIS — R1084 Generalized abdominal pain: Secondary | ICD-10-CM | POA: Insufficient documentation

## 2017-09-24 LAB — CBC WITH DIFFERENTIAL/PLATELET
Basophils Absolute: 0 10*3/uL (ref 0.0–0.1)
Basophils Relative: 0 %
EOS PCT: 8 %
Eosinophils Absolute: 0.1 10*3/uL (ref 0.0–0.7)
HCT: 30.6 % — ABNORMAL LOW (ref 36.0–46.0)
Hemoglobin: 10.3 g/dL — ABNORMAL LOW (ref 12.0–15.0)
LYMPHS ABS: 0.7 10*3/uL (ref 0.7–4.0)
Lymphocytes Relative: 46 %
MCH: 27.1 pg (ref 26.0–34.0)
MCHC: 33.7 g/dL (ref 30.0–36.0)
MCV: 80.5 fL (ref 78.0–100.0)
MONO ABS: 0.3 10*3/uL (ref 0.1–1.0)
Monocytes Relative: 19 %
Neutro Abs: 0.4 10*3/uL — ABNORMAL LOW (ref 1.7–7.7)
Neutrophils Relative %: 27 %
PLATELETS: 208 10*3/uL (ref 150–400)
RBC: 3.8 MIL/uL — AB (ref 3.87–5.11)
RDW: 12.1 % (ref 11.5–15.5)
WBC: 1.5 10*3/uL — AB (ref 4.0–10.5)

## 2017-09-24 LAB — URINALYSIS, ROUTINE W REFLEX MICROSCOPIC
BILIRUBIN URINE: NEGATIVE
Glucose, UA: NEGATIVE mg/dL
Ketones, ur: NEGATIVE mg/dL
Nitrite: NEGATIVE
Protein, ur: NEGATIVE mg/dL
SPECIFIC GRAVITY, URINE: 1.015 (ref 1.005–1.030)
pH: >9 — ABNORMAL HIGH (ref 5.0–8.0)

## 2017-09-24 LAB — COMPREHENSIVE METABOLIC PANEL
ALT: 17 U/L (ref 14–54)
ANION GAP: 4 — AB (ref 5–15)
AST: 30 U/L (ref 15–41)
Albumin: 3.4 g/dL — ABNORMAL LOW (ref 3.5–5.0)
Alkaline Phosphatase: 56 U/L (ref 38–126)
BUN: <5 mg/dL — ABNORMAL LOW (ref 6–20)
CHLORIDE: 108 mmol/L (ref 101–111)
CO2: 25 mmol/L (ref 22–32)
Calcium: 8.9 mg/dL (ref 8.9–10.3)
Creatinine, Ser: 0.48 mg/dL (ref 0.44–1.00)
Glucose, Bld: 89 mg/dL (ref 65–99)
POTASSIUM: 3.5 mmol/L (ref 3.5–5.1)
Sodium: 137 mmol/L (ref 135–145)
Total Bilirubin: 0.3 mg/dL (ref 0.3–1.2)
Total Protein: 7.4 g/dL (ref 6.5–8.1)

## 2017-09-24 LAB — URINALYSIS, MICROSCOPIC (REFLEX)

## 2017-09-24 LAB — LIPASE, BLOOD: LIPASE: 32 U/L (ref 11–51)

## 2017-09-24 LAB — PREGNANCY, URINE: PREG TEST UR: NEGATIVE

## 2017-09-24 MED ORDER — FAMOTIDINE 20 MG PO TABS
40.0000 mg | ORAL_TABLET | Freq: Once | ORAL | Status: AC
  Administered 2017-09-24: 40 mg via ORAL
  Filled 2017-09-24: qty 2

## 2017-09-24 MED ORDER — OMEPRAZOLE 20 MG PO CPDR: 20.0000 mg | DELAYED_RELEASE_CAPSULE | Freq: Every day | ORAL | 0 refills | Status: DC

## 2017-09-24 NOTE — ED Notes (Signed)
Pt verbalized understanding of discharge instructions and denies any further questions at this time.   

## 2017-09-24 NOTE — ED Provider Notes (Signed)
[facility] EMERGENCY DEPARTMENT Provider Note   CSN: [id number] Arrival date & time: 09/24/17  [id number]     History   Chief Complaint Chief Complaint  Patient presents with  . Abdominal Pain  . Flank Pain    HPI Grace Bishop is a 31 y.o. female.  HPI About 3 days patient has been having sharp pains that have been coming and going in her upper abdomen.  She was worried it was her gallbladder because in the past she was told it had sludge in it.  No vomiting, no fever, no diarrhea.  No abnormal vaginal discharge.  Patient is currently having a menstrual cycle.  She does also endorse some diffuse abdominal discomfort more on the left upper.  Currently having a menstrual cycle.  She does report she gets abdominal discomfort during menstrual cycle as well.  No pain or burning with urination.  She has also recently made a dietary change to all vegetable diet.  She is eating a lot more raw vegetables. Past Medical History:  Diagnosis Date  . Anxiety   . Lupus    joint pain  . Migraines   . Ovarian cyst   . Thyroid disease     Patient Active Problem List   Diagnosis Date Noted  . Acute intractable headache   . Altered mental status   . Encephalopathy 12/24/2016    Past Surgical History:  Procedure Laterality Date  . OVARIAN CYST REMOVAL    . TONSILLECTOMY      OB History    No data available       Home Medications    Prior to Admission medications   Medication Sig Start Date End Date Taking? Authorizing Provider  baclofen (LIORESAL) 10 MG tablet Take 10 mg 3 (three) times daily as needed by mouth for muscle spasms.   Yes [provider]  omeprazole (PRILOSEC) 20 MG capsule Take 20 mg daily by mouth.   Yes [provider]  hydroxychloroquine (PLAQUENIL) 200 MG tablet Take by mouth daily.    [provider]  metoCLOPramide (REGLAN) 10 MG tablet Take 10 mg by mouth 4 (four) times daily.    [provider]  omeprazole  (PRILOSEC) 20 MG capsule Take 1 capsule (20 mg total) daily by mouth. 09/24/17   Arby BarrettePfeiffer, Ranbir Chew, MD    Family History Family History  Problem Relation Age of Onset  . Thyroid disease Mother   . Drug abuse Father   . Sickle cell trait Sister     Social History Social History   Tobacco Use  . Smoking status: Former Smoker    Years: 2.00  . Smokeless tobacco: Never Used  Substance Use Topics  . Alcohol use: No  . Drug use: No     Allergies   Patient has no known allergies.   Review of Systems Review of Systems 10 Systems reviewed and are negative for acute change except as noted in the HPI.   Physical Exam Updated Vital Signs BP 112/70 (BP Location: Right Arm)   Pulse 65   Temp 98 F (36.7 C) (Oral)   Resp 18   Ht 5\' 2"  (1.575 m)   Wt 51.3 kg (113 lb)   LMP 09/21/2017   SpO2 100%   BMI 20.67 kg/m   Physical Exam  Constitutional: She is oriented to person, place, and time. She appears well-developed and well-nourished. No distress.  Awake, alert, nontoxic appearance with baseline speech.  HENT:  Head: Atraumatic.  Nose: Nose normal.  Mouth/Throat: Oropharynx is clear and moist. No oropharyngeal exudate.  Eyes: Conjunctivae and EOM are normal. Pupils are equal, round, and reactive to light. Right eye exhibits no discharge. Left eye exhibits no discharge.  Neck: Neck supple.  Cardiovascular: Normal rate, regular rhythm, normal heart sounds and intact distal pulses.  No murmur heard. Pulmonary/Chest: Effort normal and breath sounds normal. No stridor. No respiratory distress. She has no wheezes. She has no rales. She exhibits no tenderness.  Abdominal: Soft. Bowel sounds are normal. She exhibits no distension and no mass. There is tenderness. There is no rebound and no guarding.  Patient's abdomen is nondistended and soft.  She has mild to moderate right upper quadrant tenderness but endorses more tenderness on the left upper quadrant.  No guarding or palpable  mass.  Mild discomfort diffusely to lower abdominal palpation  Musculoskeletal: Normal range of motion. She exhibits no edema or tenderness.       Thoracic back: She exhibits no tenderness.       Lumbar back: She exhibits no tenderness.  Bilateral lower extremities non tender without new rashes or color change, baseline ROM with intact DP / PT pulses, CR<2 secs all digits bilaterally, sensation baseline light touch bilaterally for pt, DTR's symmetric and intact bilaterally KJ / AJ, motor symmetric bilateral 5 / 5 hip flexion, quadriceps, hamstrings, EHL, foot dorsiflexion, foot plantarflexion, gait somewhat antalgic but without apparent new ataxia.  Lymphadenopathy:    She has no cervical adenopathy.  Neurological: She is alert and oriented to person, place, and time. No cranial nerve deficit. She exhibits normal muscle tone. Coordination normal.  Mental status baseline for patient.  Upper extremity motor strength and sensation intact and symmetric bilaterally.  Skin: Skin is warm and dry. No rash noted.  Psychiatric: She has a normal mood and affect.  Nursing note and vitals reviewed.    ED Treatments / Results  Labs (all labs ordered are listed, but only abnormal results are displayed) Labs Reviewed  URINALYSIS, ROUTINE W REFLEX MICROSCOPIC - Abnormal; Notable for the following components:      Result Value   Color, Urine RED (*)    APPearance CLOUDY (*)    pH >9.0 (*)    Hgb urine dipstick LARGE (*)    Leukocytes, UA TRACE (*)    All other components within normal limits  CBC WITH DIFFERENTIAL/PLATELET - Abnormal; Notable for the following components:   WBC 1.5 (*)    RBC 3.80 (*)    Hemoglobin 10.3 (*)    HCT 30.6 (*)    Neutro Abs 0.4 (*)    All other components within normal limits  COMPREHENSIVE METABOLIC PANEL - Abnormal; Notable for the following components:   BUN <5 (*)    Albumin 3.4 (*)    Anion gap 4 (*)    All other components within normal limits  URINALYSIS,  MICROSCOPIC (REFLEX) - Abnormal; Notable for the following components:   Bacteria, UA FEW (*)    Squamous Epithelial / LPF 0-5 (*)    All other components within normal limits  PREGNANCY, URINE  LIPASE, BLOOD    EKG  EKG Interpretation None       Radiology No results found.  Procedures Procedures (including critical care time)  Medications Ordered in ED Medications  famotidine (PEPCID) tablet 40 mg (40 mg Oral Given 09/24/17 0746)     Initial Impression / Assessment and Plan / ED Course  I have reviewed the triage vital signs and the nursing notes.  Pertinent labs & imaging results that were available during my care of the patient were reviewed by me and considered in my medical decision making (see chart for details).      Final Clinical Impressions(s) / ED Diagnoses   Final diagnoses:  Generalized abdominal pain   Patient has abdominal pain that is generalized.  She initially reported right upper quadrant pain.  However with examination she reported left upper quadrant was worse than right and also reported some diffuse lower abdominal discomfort.  Patient is currently having a menstrual cycle.  She has history of lupus.  Patient has had reflux and taken omeprazole sporadically but not recently.  She also reports she been told she has sludge in her gallbladder.  At this time, pain does not localized to the right upper quadrant and seems to be less painful than the left.  She does not have elevation in LFTs and I highly doubt acute cholecystitis as the etiology.  Patient has mild lower abdominal discomfort without associated dysuria urgency or abnormal vaginal discharge.  On examination her pain is predominantly left upper quadrant and epigastric.  This time will advise patient uses a daily PPI.  She is also started a new diet of predominantly raw vegetables.  May be contributing.  Patient's white count is slightly lower than previous but she is chronically leukopenic.   Patient is made aware of this and the need to work with her family doctor or rheumatologist to monitor this finding. ED Discharge Orders        Ordered    omeprazole (PRILOSEC) 20 MG capsule  Daily     09/24/17 0747       Arby BarrettePfeiffer, Clotine Heiner, MD 09/24/17 [phone]

## 2017-09-24 NOTE — ED Notes (Signed)
ED Provider at bedside. 

## 2017-09-24 NOTE — ED Triage Notes (Signed)
Pt with bilat flank pain and upper abd pain x 2-3 days denies N/V or fever

## 2017-09-24 NOTE — Discharge Instructions (Addendum)
1.  Return if you develop vomiting, fever, worsening pain or pain concentrating to one area of your abdomen. 2.  Take omeprazole daily for the next 2 weeks. 3.  Schedule a recheck with your family doctor and/or rheumatologist.  Your white blood cell count is always low but it is slightly lower than previously and this should be monitored closely by your doctor.

## 2017-10-16 ENCOUNTER — Encounter (HOSPITAL_BASED_OUTPATIENT_CLINIC_OR_DEPARTMENT_OTHER): Payer: Self-pay | Admitting: Emergency Medicine

## 2017-10-16 ENCOUNTER — Other Ambulatory Visit: Payer: Self-pay

## 2017-10-16 ENCOUNTER — Emergency Department (HOSPITAL_BASED_OUTPATIENT_CLINIC_OR_DEPARTMENT_OTHER)
Admission: EM | Admit: 2017-10-16 | Discharge: 2017-10-16 | Disposition: A | Discharge status: Home / Self Care | Payer: Self-pay | Attending: Emergency Medicine | Admitting: Emergency Medicine

## 2017-10-16 DIAGNOSIS — R1030 Lower abdominal pain, unspecified: Secondary | ICD-10-CM | POA: Insufficient documentation

## 2017-10-16 DIAGNOSIS — Z5321 Procedure and treatment not carried out due to patient leaving prior to being seen by health care provider: Secondary | ICD-10-CM | POA: Insufficient documentation

## 2017-10-16 LAB — URINALYSIS, ROUTINE W REFLEX MICROSCOPIC
Bilirubin Urine: NEGATIVE
GLUCOSE, UA: NEGATIVE mg/dL
Hgb urine dipstick: NEGATIVE
Ketones, ur: NEGATIVE mg/dL
LEUKOCYTES UA: NEGATIVE
Nitrite: NEGATIVE
PH: 7.5 (ref 5.0–8.0)
PROTEIN: NEGATIVE mg/dL

## 2017-10-16 LAB — PREGNANCY, URINE: Preg Test, Ur: NEGATIVE

## 2017-10-16 NOTE — ED Notes (Signed)
All triage prior to note completed by this RN.  Documented under tech sign in accidentally.

## 2017-10-16 NOTE — ED Triage Notes (Signed)
Patient reports that she was told by MD the last time she was here that if her abdominal pain got worse to come back to ER.  Patient reports worsening lower abdominal pain after eating or drinking. Reports nausea.  Denies vomiting, diarrhea.

## 2017-10-17 ENCOUNTER — Encounter (HOSPITAL_BASED_OUTPATIENT_CLINIC_OR_DEPARTMENT_OTHER): Payer: Self-pay | Admitting: Emergency Medicine

## 2017-10-17 ENCOUNTER — Emergency Department (HOSPITAL_BASED_OUTPATIENT_CLINIC_OR_DEPARTMENT_OTHER)
Admission: EM | Admit: 2017-10-17 | Discharge: 2017-10-17 | Disposition: A | Discharge status: Home / Self Care | Payer: Self-pay | Attending: Emergency Medicine | Admitting: Emergency Medicine

## 2017-10-17 ENCOUNTER — Other Ambulatory Visit: Payer: Self-pay

## 2017-10-17 ENCOUNTER — Emergency Department (HOSPITAL_BASED_OUTPATIENT_CLINIC_OR_DEPARTMENT_OTHER): Payer: Self-pay

## 2017-10-17 DIAGNOSIS — R05 Cough: Secondary | ICD-10-CM | POA: Insufficient documentation

## 2017-10-17 DIAGNOSIS — R070 Pain in throat: Secondary | ICD-10-CM | POA: Insufficient documentation

## 2017-10-17 DIAGNOSIS — E079 Disorder of thyroid, unspecified: Secondary | ICD-10-CM | POA: Insufficient documentation

## 2017-10-17 DIAGNOSIS — Z87891 Personal history of nicotine dependence: Secondary | ICD-10-CM | POA: Insufficient documentation

## 2017-10-17 DIAGNOSIS — R1011 Right upper quadrant pain: Secondary | ICD-10-CM | POA: Insufficient documentation

## 2017-10-17 DIAGNOSIS — Z79899 Other long term (current) drug therapy: Secondary | ICD-10-CM | POA: Insufficient documentation

## 2017-10-17 DIAGNOSIS — K8020 Calculus of gallbladder without cholecystitis without obstruction: Secondary | ICD-10-CM | POA: Insufficient documentation

## 2017-10-17 DIAGNOSIS — R110 Nausea: Secondary | ICD-10-CM | POA: Insufficient documentation

## 2017-10-17 HISTORY — DX: Disease of stomach and duodenum, unspecified: K31.9

## 2017-10-17 LAB — COMPREHENSIVE METABOLIC PANEL
ALT: 14 U/L (ref 14–54)
AST: 21 U/L (ref 15–41)
Albumin: 3.2 g/dL — ABNORMAL LOW (ref 3.5–5.0)
Alkaline Phosphatase: 48 U/L (ref 38–126)
Anion gap: 5 (ref 5–15)
BUN: 6 mg/dL (ref 6–20)
CO2: 24 mmol/L (ref 22–32)
Calcium: 8.6 mg/dL — ABNORMAL LOW (ref 8.9–10.3)
Chloride: 106 mmol/L (ref 101–111)
Creatinine, Ser: 0.46 mg/dL (ref 0.44–1.00)
GFR calc Af Amer: >60 mL/min (ref >60)
GFR calc non Af Amer: >60 mL/min (ref >60)
Glucose, Bld: 93 mg/dL (ref 65–99)
Potassium: 3.5 mmol/L (ref 3.5–5.1)
Sodium: 135 mmol/L (ref 135–145)
Total Bilirubin: 0.1 mg/dL — ABNORMAL LOW (ref 0.3–1.2)
Total Protein: 7 g/dL (ref 6.5–8.1)

## 2017-10-17 LAB — CBC WITH DIFFERENTIAL/PLATELET
Basophils Absolute: 0 10*3/uL (ref 0.0–0.1)
Basophils Relative: 0 %
Eosinophils Absolute: 0.1 10*3/uL (ref 0.0–0.7)
Eosinophils Relative: 6 %
HCT: 31.2 % — ABNORMAL LOW (ref 36.0–46.0)
Hemoglobin: 10.3 g/dL — ABNORMAL LOW (ref 12.0–15.0)
Lymphocytes Relative: 22 %
Lymphs Abs: 0.5 10*3/uL — ABNORMAL LOW (ref 0.7–4.0)
MCH: 26.7 pg (ref 26.0–34.0)
MCHC: 33 g/dL (ref 30.0–36.0)
MCV: 80.8 fL (ref 78.0–100.0)
Monocytes Absolute: 0.2 10*3/uL (ref 0.1–1.0)
Monocytes Relative: 9 %
Neutro Abs: 1.3 10*3/uL — ABNORMAL LOW (ref 1.7–7.7)
Neutrophils Relative %: 63 %
Platelets: 230 10*3/uL (ref 150–400)
RBC: 3.86 MIL/uL — ABNORMAL LOW (ref 3.87–5.11)
RDW: 12.4 % (ref 11.5–15.5)
WBC: 2.2 10*3/uL — ABNORMAL LOW (ref 4.0–10.5)

## 2017-10-17 LAB — URINALYSIS, ROUTINE W REFLEX MICROSCOPIC
Bilirubin Urine: NEGATIVE
GLUCOSE, UA: NEGATIVE mg/dL
HGB URINE DIPSTICK: NEGATIVE
KETONES UR: NEGATIVE mg/dL
Leukocytes, UA: NEGATIVE
Nitrite: NEGATIVE
PROTEIN: NEGATIVE mg/dL
Specific Gravity, Urine: 1.015 (ref 1.005–1.030)
pH: 8 (ref 5.0–8.0)

## 2017-10-17 LAB — LIPASE, BLOOD: Lipase: 33 U/L (ref 11–51)

## 2017-10-17 LAB — PREGNANCY, URINE: Preg Test, Ur: NEGATIVE

## 2017-10-17 MED ORDER — SUCRALFATE 1 GM/10ML PO SUSP: 1.0000 g | Freq: Three times a day (TID) | ORAL | 0 refills | Status: DC

## 2017-10-17 MED ORDER — ONDANSETRON HCL 4 MG/2ML IJ SOLN
4.0000 mg | Freq: Once | INTRAMUSCULAR | Status: AC
  Administered 2017-10-17: 4 mg via INTRAVENOUS
  Filled 2017-10-17: qty 2

## 2017-10-17 MED ORDER — ONDANSETRON HCL 4 MG PO TABS: 4.0000 mg | ORAL_TABLET | Freq: Four times a day (QID) | ORAL | 0 refills | Status: DC

## 2017-10-17 MED ORDER — PANTOPRAZOLE SODIUM 20 MG PO TBEC: 20.0000 mg | DELAYED_RELEASE_TABLET | Freq: Every day | ORAL | 0 refills | Status: DC

## 2017-10-17 MED ORDER — PANTOPRAZOLE SODIUM 40 MG IV SOLR
40.0000 mg | Freq: Once | INTRAVENOUS | Status: AC
  Administered 2017-10-17: 40 mg via INTRAVENOUS
  Filled 2017-10-17: qty 40

## 2017-10-17 MED ORDER — GI COCKTAIL ~~LOC~~
30.0000 mL | Freq: Once | ORAL | Status: AC
Start: 2017-10-17 — End: 2017-10-17
  Administered 2017-10-17: 30 mL via ORAL
  Filled 2017-10-17: qty 30

## 2017-10-17 NOTE — ED Triage Notes (Addendum)
Epigastric pain on going since 2015, had been eval by GI and had endoscopy in 2016, was told her stomach was red and irritated. Pain worse after eating with nausea. Has been out of Prilosec x 1 month

## 2017-10-17 NOTE — ED Provider Notes (Signed)
[facility] EMERGENCY DEPARTMENT Provider Note   CSN: [id number] Arrival date & time: 10/17/17  [id number]     History   Chief Complaint Chief Complaint  Patient presents with  . Abdominal Pain    HPI Grace PaceMaranda Bishop is a 31 y.o. female with history of lupus, chronic, intermittent epigastric pain who presents with a 2-day history of worsening epigastric and right upper quadrant pain.  She has had associated nausea.  She has associated burning in her chest at times.  She has also had associated sore throat and cough.  Her symptoms are worse after she eats.  She denies any vomiting, diarrhea, or bloody stools.  She denies any fevers.  She is concerned about her gallbladder because she has been told she's had sludge before.  She has tried taking Zantac at home without relief.  She denies any chest pain, shortness of breath, abnormal vaginal bleeding or discharge, urinary symptoms.  Patient reports she does not take NSAID medication anymore, however used to take a lot of it several years ago.  HPI  Past Medical History:  Diagnosis Date  . Anxiety   . Lupus    joint pain  . Migraines   . Ovarian cyst   . Stomach problems   . Thyroid disease     Patient Active Problem List   Diagnosis Date Noted  . Acute intractable headache   . Altered mental status   . Encephalopathy 12/24/2016    Past Surgical History:  Procedure Laterality Date  . OVARIAN CYST REMOVAL    . TONSILLECTOMY      OB History    No data available       Home Medications    Prior to Admission medications   Medication Sig Start Date End Date Taking? Authorizing Provider  baclofen (LIORESAL) 10 MG tablet Take 10 mg 3 (three) times daily as needed by mouth for muscle spasms.   Yes [provider]  hydroxychloroquine (PLAQUENIL) 200 MG tablet Take by mouth daily.   Yes [provider]  metoCLOPramide (REGLAN) 10 MG tablet Take 10 mg by mouth 4 (four) times daily.   Yes [provider]  omeprazole (PRILOSEC) 20 MG capsule Take 20 mg daily by mouth.   Yes [provider]  omeprazole (PRILOSEC) 20 MG capsule Take 1 capsule (20 mg total) daily by mouth. 09/24/17   Arby BarrettePfeiffer, Marcy, MD  ondansetron (ZOFRAN) 4 MG tablet Take 1 tablet (4 mg total) by mouth every 6 (six) hours. 10/17/17   Aquiles Ruffini, Waylan BogaAlexandra M, PA-C  pantoprazole (PROTONIX) 20 MG tablet Take 1 tablet (20 mg total) by mouth daily. 10/17/17   Rosalia Mcavoy, Waylan BogaAlexandra M, PA-C  sucralfate (CARAFATE) 1 GM/10ML suspension Take 10 mLs (1 g total) by mouth 4 (four) times daily -  with meals and at bedtime. 10/17/17   Emi HolesLaw, Maleah Rabago M, PA-C    Family History Family History  Problem Relation Age of Onset  . Thyroid disease Mother   . Drug abuse Father   . Sickle cell trait Sister     Social History Social History   Tobacco Use  . Smoking status: Former Smoker    Years: 2.00  . Smokeless tobacco: Never Used  Substance Use Topics  . Alcohol use: No  . Drug use: No     Allergies   Patient has no known allergies.   Review of Systems Review of Systems  Constitutional: Negative for chills and fever.  HENT: Positive for sore throat. Negative for facial  swelling.   Respiratory: Positive for cough. Negative for shortness of breath.   Cardiovascular: Negative for chest pain.  Gastrointestinal: Positive for abdominal pain and nausea. Negative for vomiting.  Genitourinary: Negative for dysuria.  Musculoskeletal: Negative for back pain.  Skin: Negative for rash and wound.  Neurological: Negative for headaches.  Psychiatric/Behavioral: The patient is not nervous/anxious.      Physical Exam Updated Vital Signs BP 104/69 (BP Location: Left Arm)   Pulse 76   Temp 98.3 F (36.8 C) (Oral)   Resp 16   Ht 5\' 2"  (1.575 m)   Wt 48.1 kg (106 lb)   LMP 09/21/2017   SpO2 100%   BMI 19.39 kg/m   Physical Exam  Constitutional: She appears well-developed and well-nourished. No distress.  HENT:  Head:  Normocephalic and atraumatic.  Mouth/Throat: Oropharynx is clear and moist. No oropharyngeal exudate.  Eyes: Conjunctivae are normal. Pupils are equal, round, and reactive to light. Right eye exhibits no discharge. Left eye exhibits no discharge. No scleral icterus.  Neck: Normal range of motion. Neck supple. No thyromegaly present.  Cardiovascular: Normal rate, regular rhythm, normal heart sounds and intact distal pulses. Exam reveals no gallop and no friction rub.  No murmur heard. Pulmonary/Chest: Effort normal and breath sounds normal. No stridor. No respiratory distress. She has no wheezes. She has no rales.  Abdominal: Soft. Bowel sounds are normal. She exhibits no distension. There is tenderness in the right upper quadrant and epigastric area. There is positive Murphy's sign. There is no rebound and no guarding.  Musculoskeletal: She exhibits no edema.  Lymphadenopathy:    She has no cervical adenopathy.  Neurological: She is alert. Coordination normal.  Skin: Skin is warm and dry. No rash noted. She is not diaphoretic. No pallor.  Psychiatric: She has a normal mood and affect.  Nursing note and vitals reviewed.    ED Treatments / Results  Labs (all labs ordered are listed, but only abnormal results are displayed) Labs Reviewed  COMPREHENSIVE METABOLIC PANEL - Abnormal; Notable for the following components:      Result Value   Calcium 8.6 (*)    Albumin 3.2 (*)    Total Bilirubin 0.1 (*)    All other components within normal limits  CBC WITH DIFFERENTIAL/PLATELET - Abnormal; Notable for the following components:   WBC 2.2 (*)    RBC 3.86 (*)    Hemoglobin 10.3 (*)    HCT 31.2 (*)    Neutro Abs 1.3 (*)    Lymphs Abs 0.5 (*)    All other components within normal limits  URINALYSIS, ROUTINE W REFLEX MICROSCOPIC  PREGNANCY, URINE  LIPASE, BLOOD    EKG  EKG Interpretation None       Radiology [place] Abdomen Limited Ruq  Result Date: 10/17/2017 CLINICAL DATA:   Postprandial epigastric pain and nausea. EXAM: ULTRASOUND ABDOMEN LIMITED RIGHT UPPER QUADRANT COMPARISON:  None. FINDINGS: Gallbladder: There are multiple small mobile gallstones within the gallbladder lumen as well as a mild amount of dependent biliary sludge. No associated gallbladder wall thickening, sonographic Murphy's sign or pericholecystic fluid. Common bile duct: Diameter: 2 mm. Liver: Geographic area of increased echogenicity within the deep right lobe near the porta hepatis is elongated in one dimension (roughly 3.3 x 1.1 x 1.6 cm) and is felt to most likely represent focal fat. Portal vein is patent on color Doppler imaging with normal direction of blood flow towards the liver. IMPRESSION: 1. Cholelithiasis with several small mobile gallstones identified. No  evidence by ultrasound of cholecystitis or biliary obstruction. 2. Geographic area of increased echogenicity within the deep right lobe of the liver which appears elongated by ultrasound and is felt to most likely represent focal fat. MRI of the abdomen with and without gadolinium is recommended for confirmation of benign fat. Electronically Signed   By: Irish Lack M.D.   On: 10/17/2017 11:53    Procedures Procedures (including critical care time)  Medications Ordered in ED Medications  pantoprazole (PROTONIX) injection 40 mg (40 mg Intravenous Given 10/17/17 0938)  ondansetron (ZOFRAN) injection 4 mg (4 mg Intravenous Given 10/17/17 0938)  gi cocktail (Maalox,Lidocaine,Donnatal) (30 mLs Oral Given 10/17/17 1210)     Initial Impression / Assessment and Plan / ED Course  I have reviewed the triage vital signs and the nursing notes.  Pertinent labs & imaging results that were available during my care of the patient were reviewed by me and considered in my medical decision making (see chart for details).     Patient with right upper quadrant, epigastric pain.  Patient with stable CBC from the past, stable chronic anemia and  leukopenia.  Lipase is negative.  CMP is negative.  Right upper quadrant ultrasound shows cholelithiasis, but no evidence of cholecystitis or biliary obstruction.  Ultrasound also shows [Geographic area of increased echogenicity within the deep right lobe of the liver which appears elongated by ultrasound and is felt to most likely represent focal fat. MRI of the abdomen with and without gadolinium is recommended for confirmation of benign fat.]  I made patient aware of this and that she will need to follow-up for an MRI as she.  I will also give patient follow-up to general surgery for further evaluation and treatment of cholelithiasis.  Patient discharged home with Protonix, Zofran, Carafate for symptomatic treatment, as there could also be a GERD or ulcer component to her pain.  For this reason, I have also given patient follow-up to gastroenterology.  Her symptoms do seem to be food related.  Strict return precautions given.  Patient understands and agrees with plan.  Patient vitals stable throughout ED course and discharged in satisfactory condition. I discussed patient case with Dr. Clarene Duke who guided the patient's management and agrees with plan.   Final Clinical Impressions(s) / ED Diagnoses   Final diagnoses:  RUQ abdominal pain  Calculus of gallbladder without cholecystitis without obstruction    ED Discharge Orders        Ordered    sucralfate (CARAFATE) 1 GM/10ML suspension  3 times daily with meals & bedtime     10/17/17 1211    ondansetron (ZOFRAN) 4 MG tablet  Every 6 hours     10/17/17 1211    pantoprazole (PROTONIX) 20 MG tablet  Daily     10/17/17 [address] PA-C 10/17/17 1638    Little, Ambrose Finland, MD 10/17/17 2106

## 2017-10-17 NOTE — Discharge Instructions (Signed)
Medications: Protonix, Zofran, Carafate  Treatment: Take Protonix once daily as prescribed for acid reflux.  Take Zofran every 6 hours as needed for nausea or vomiting.  Take Carafate as prescribed before each meal and before bedtime to help with reflux symptoms as well.  Try to avoid greasy and fatty foods and harsh green vegetables that are hard to digest.  Follow-up: Please follow-up with the general surgeon for further evaluation and treatment of your gallstones.  Please follow-up and establish care with a primary care provider as well and make sure to ultimately have an MRI of your abdomen to further evaluate the spot that was seen on your liver on ultrasound today.  You may also want to follow-up with a gastroenterologist for further evaluation and treatment of possible ulcers in your stomach.  Please return to the emergency department if you develop any fever over 100.4, severe right upper quadrant pain, intractable vomiting, or any other new or concerning symptoms.

## 2017-10-17 NOTE — ED Notes (Signed)
Patient transported to Ultrasound 

## 2017-10-17 NOTE — ED Notes (Signed)
Assumed care of patient from [place], [place]. PT resting quietly. She is awaiting Ultrasound report. Patient updated on wait. No distress.

## 2017-10-17 NOTE — ED Notes (Addendum)
Awaiting U/S results and then will give GI cocktail, as ordered. Informed of same, states," I understand"

## 2017-12-15 ENCOUNTER — Encounter (HOSPITAL_BASED_OUTPATIENT_CLINIC_OR_DEPARTMENT_OTHER): Payer: Self-pay

## 2017-12-15 ENCOUNTER — Other Ambulatory Visit: Payer: Self-pay

## 2017-12-15 ENCOUNTER — Emergency Department (HOSPITAL_BASED_OUTPATIENT_CLINIC_OR_DEPARTMENT_OTHER)
Admission: EM | Admit: 2017-12-15 | Discharge: 2017-12-15 | Disposition: A | Discharge status: Home / Self Care | Payer: BLUE CROSS/BLUE SHIELD | Attending: Emergency Medicine | Admitting: Emergency Medicine

## 2017-12-15 DIAGNOSIS — J111 Influenza due to unidentified influenza virus with other respiratory manifestations: Secondary | ICD-10-CM | POA: Diagnosis not present

## 2017-12-15 DIAGNOSIS — R110 Nausea: Secondary | ICD-10-CM | POA: Insufficient documentation

## 2017-12-15 DIAGNOSIS — M791 Myalgia, unspecified site: Secondary | ICD-10-CM | POA: Insufficient documentation

## 2017-12-15 DIAGNOSIS — R05 Cough: Secondary | ICD-10-CM | POA: Diagnosis present

## 2017-12-15 DIAGNOSIS — Z79899 Other long term (current) drug therapy: Secondary | ICD-10-CM | POA: Diagnosis not present

## 2017-12-15 DIAGNOSIS — E079 Disorder of thyroid, unspecified: Secondary | ICD-10-CM | POA: Diagnosis not present

## 2017-12-15 DIAGNOSIS — Z87891 Personal history of nicotine dependence: Secondary | ICD-10-CM | POA: Insufficient documentation

## 2017-12-15 DIAGNOSIS — R42 Dizziness and giddiness: Secondary | ICD-10-CM | POA: Diagnosis not present

## 2017-12-15 DIAGNOSIS — R6889 Other general symptoms and signs: Secondary | ICD-10-CM

## 2017-12-15 MED ORDER — OSELTAMIVIR PHOSPHATE 75 MG PO CAPS: 75.0000 mg | ORAL_CAPSULE | Freq: Two times a day (BID) | ORAL | 0 refills | Status: DC

## 2017-12-15 NOTE — ED Notes (Signed)
ED Provider at bedside. 

## 2017-12-15 NOTE — ED Provider Notes (Signed)
[facility] EMERGENCY DEPARTMENT Provider Note   CSN: [id number] Arrival date & time: 12/15/17  1711     History   Chief Complaint Chief Complaint  Patient presents with  . Cough    HPI Grace Bishop is a 32 y.o. female.  Patient presents with flulike symptoms started yesterday.  Associated with nausea no vomiting no fever body aches mild headache some dizziness.  No syncope.  No rash.  Coworker was diagnosed flu symptoms.  Patient did not have flu vaccination this year.  Cough is occasionally productive.      Past Medical History:  Diagnosis Date  . Anxiety   . Lupus    joint pain  . Migraines   . Ovarian cyst   . Stomach problems   . Thyroid disease     Patient Active Problem List   Diagnosis Date Noted  . Acute intractable headache   . Altered mental status   . Encephalopathy 12/24/2016    Past Surgical History:  Procedure Laterality Date  . OVARIAN CYST REMOVAL    . TONSILLECTOMY      OB History    No data available       Home Medications    Prior to Admission medications   Medication Sig Start Date End Date Taking? Authorizing Provider  baclofen (LIORESAL) 10 MG tablet Take 10 mg 3 (three) times daily as needed by mouth for muscle spasms.    [provider]  hydroxychloroquine (PLAQUENIL) 200 MG tablet Take by mouth daily.    [provider]  metoCLOPramide (REGLAN) 10 MG tablet Take 10 mg by mouth 4 (four) times daily.    [provider]  omeprazole (PRILOSEC) 20 MG capsule Take 20 mg daily by mouth.    [provider]  omeprazole (PRILOSEC) 20 MG capsule Take 1 capsule (20 mg total) daily by mouth. 09/24/17   Arby BarrettePfeiffer, Marcy, MD  ondansetron (ZOFRAN) 4 MG tablet Take 1 tablet (4 mg total) by mouth every 6 (six) hours. 10/17/17   Law, Waylan BogaAlexandra M, PA-C  oseltamivir (TAMIFLU) 75 MG capsule Take 1 capsule (75 mg total) by mouth every 12 (twelve) hours. 12/15/17   Vanetta MuldersZackowski, Asani Mcburney, MD  pantoprazole  (PROTONIX) 20 MG tablet Take 1 tablet (20 mg total) by mouth daily. 10/17/17   Law, Waylan BogaAlexandra M, PA-C  sucralfate (CARAFATE) 1 GM/10ML suspension Take 10 mLs (1 g total) by mouth 4 (four) times daily -  with meals and at bedtime. 10/17/17   Emi HolesLaw, Alexandra M, PA-C    Family History Family History  Problem Relation Age of Onset  . Thyroid disease Mother   . Drug abuse Father   . Sickle cell trait Sister     Social History Social History   Tobacco Use  . Smoking status: Former Smoker    Years: 2.00  . Smokeless tobacco: Never Used  Substance Use Topics  . Alcohol use: No  . Drug use: No     Allergies   Patient has no known allergies.   Review of Systems Review of Systems  Constitutional: Negative for fever.  HENT: Positive for congestion.   Eyes: Negative for visual disturbance.  Respiratory: Positive for cough.   Cardiovascular: Negative for chest pain.  Gastrointestinal: Positive for nausea. Negative for abdominal pain.  Genitourinary: Negative for dysuria.  Musculoskeletal: Positive for myalgias.  Skin: Negative for rash.  Neurological: Positive for dizziness.  Hematological: Does not bruise/bleed easily.  Psychiatric/Behavioral: Negative for confusion.     Physical Exam Updated  Vital Signs BP 110/87 (BP Location: Left Arm)   Pulse 82   Temp 98.8 F (37.1 C) (Oral)   Resp 18   Ht (P) 1.575 m (5\' 2" )   LMP 11/20/2017   SpO2 100%   BMI (P) 19.39 kg/m   Physical Exam  Constitutional: She is oriented to person, place, and time. She appears well-developed and well-nourished. No distress.  HENT:  Head: Normocephalic.  Mouth/Throat: Oropharynx is clear and moist. No oropharyngeal exudate.  Eyes: Conjunctivae and EOM are normal. Pupils are equal, round, and reactive to light.  Neck: Normal range of motion. Neck supple.  Cardiovascular: Normal rate and regular rhythm.  Pulmonary/Chest: Effort normal and breath sounds normal. No respiratory distress. She has no  wheezes. She has no rales.  Abdominal: Soft. Bowel sounds are normal. There is no tenderness.  Musculoskeletal: Normal range of motion.  Neurological: She is alert and oriented to person, place, and time. No cranial nerve deficit or sensory deficit. She exhibits normal muscle tone. Coordination normal.  Skin: Skin is warm.  Nursing note and vitals reviewed.    ED Treatments / Results  Labs (all labs ordered are listed, but only abnormal results are displayed) Labs Reviewed - No data to display  EKG  EKG Interpretation None       Radiology No results found.  Procedures Procedures (including critical care time)  Medications Ordered in ED Medications - No data to display   Initial Impression / Assessment and Plan / ED Course  I have reviewed the triage vital signs and the nursing notes.  Pertinent labs & imaging results that were available during my care of the patient were reviewed by me and considered in my medical decision making (see chart for details).     Patient nontoxic no acute distress.  Symptoms suggestive of flulike symptoms.  Patient within 24 hours of onset of symptoms.  Will treat with a course of Tamiflu.  Clinically no concerns for pneumonia.  No rales oxygen saturations 100%.  No tachypnea.  No tachycardia.  Lung sounds are clear bilaterally.  Patient will be treated symptomatically recommendation for Mucinex DM.  Will take Tamiflu as directed.  And given a work note.  Final Clinical Impressions(s) / ED Diagnoses   Final diagnoses:  Flu-like symptoms    ED Discharge Orders        Ordered    oseltamivir (TAMIFLU) 75 MG capsule  Every 12 hours     12/15/17 1747       Vanetta Mulders, MD 12/15/17 1818

## 2017-12-15 NOTE — ED Triage Notes (Signed)
C/o flu like sx day 2-NAD-steady gait 

## 2017-12-15 NOTE — Discharge Instructions (Signed)
Symptoms suggestive of flulike illness.  Take Tamiflu as directed.  Work note provided to be out of work for a week.  Follow-up with your doctor or return here for any new or worse symptoms.  Recommend over-the-counter medications for the flu symptoms.  Mucinex DM is very effective for cough and phlegm.

## 2017-12-16 ENCOUNTER — Emergency Department (HOSPITAL_BASED_OUTPATIENT_CLINIC_OR_DEPARTMENT_OTHER)
Admission: EM | Admit: 2017-12-16 | Discharge: 2017-12-16 | Disposition: A | Discharge status: Home / Self Care | Payer: BLUE CROSS/BLUE SHIELD | Attending: Emergency Medicine | Admitting: Emergency Medicine

## 2017-12-16 ENCOUNTER — Other Ambulatory Visit: Payer: Self-pay

## 2017-12-16 ENCOUNTER — Encounter (HOSPITAL_BASED_OUTPATIENT_CLINIC_OR_DEPARTMENT_OTHER): Payer: Self-pay

## 2017-12-16 DIAGNOSIS — R51 Headache: Secondary | ICD-10-CM | POA: Diagnosis present

## 2017-12-16 DIAGNOSIS — Z5321 Procedure and treatment not carried out due to patient leaving prior to being seen by health care provider: Secondary | ICD-10-CM | POA: Insufficient documentation

## 2017-12-16 NOTE — ED Triage Notes (Signed)
Pt requesting toradol shot for her chronic headaches, seen multiple times for the same

## 2017-12-16 NOTE — ED Notes (Signed)
Pt states her headache is gone, she feels better and she is going to leave. "If it comes back I'll come back here." pt encouraged to await md eval, informed that there is only one person in front of her to go back. Pt states "I don't think I need to be seen now, I might come back later."

## 2017-12-28 ENCOUNTER — Emergency Department (HOSPITAL_BASED_OUTPATIENT_CLINIC_OR_DEPARTMENT_OTHER)
Admission: EM | Admit: 2017-12-28 | Discharge: 2017-12-28 | Disposition: A | Discharge status: Home / Self Care | Payer: BLUE CROSS/BLUE SHIELD | Attending: Emergency Medicine | Admitting: Emergency Medicine

## 2017-12-28 ENCOUNTER — Encounter (HOSPITAL_BASED_OUTPATIENT_CLINIC_OR_DEPARTMENT_OTHER): Payer: Self-pay

## 2017-12-28 DIAGNOSIS — Z79899 Other long term (current) drug therapy: Secondary | ICD-10-CM | POA: Diagnosis not present

## 2017-12-28 DIAGNOSIS — G43909 Migraine, unspecified, not intractable, without status migrainosus: Secondary | ICD-10-CM | POA: Diagnosis present

## 2017-12-28 DIAGNOSIS — Z87891 Personal history of nicotine dependence: Secondary | ICD-10-CM | POA: Insufficient documentation

## 2017-12-28 DIAGNOSIS — G43109 Migraine with aura, not intractable, without status migrainosus: Secondary | ICD-10-CM | POA: Diagnosis not present

## 2017-12-28 MED ORDER — DIPHENHYDRAMINE HCL 50 MG/ML IJ SOLN
25.0000 mg | Freq: Once | INTRAMUSCULAR | Status: DC
  Filled 2017-12-28: qty 1

## 2017-12-28 MED ORDER — DEXAMETHASONE SODIUM PHOSPHATE 10 MG/ML IJ SOLN
10.0000 mg | Freq: Once | INTRAMUSCULAR | Status: AC
  Administered 2017-12-28: 10 mg via INTRAVENOUS
  Filled 2017-12-28: qty 1

## 2017-12-28 MED ORDER — KETOROLAC TROMETHAMINE 15 MG/ML IJ SOLN
15.0000 mg | Freq: Once | INTRAMUSCULAR | Status: AC
  Administered 2017-12-28: 15 mg via INTRAVENOUS
  Filled 2017-12-28: qty 1

## 2017-12-28 MED ORDER — SODIUM CHLORIDE 0.9 % IV BOLUS (SEPSIS)
1000.0000 mL | Freq: Once | INTRAVENOUS | Status: AC
  Administered 2017-12-28: 1000 mL via INTRAVENOUS

## 2017-12-28 MED ORDER — METOCLOPRAMIDE HCL 5 MG/ML IJ SOLN
10.0000 mg | Freq: Once | INTRAMUSCULAR | Status: AC
  Administered 2017-12-28: 10 mg via INTRAVENOUS
  Filled 2017-12-28: qty 2

## 2017-12-28 NOTE — ED Triage Notes (Signed)
Pt reports migraine since Friday; states she usually takes reglan, baclofen, and benadryl but reports no relief from it this time. Pain rated 4/10; states the migraine is light and sound sensitive.

## 2017-12-28 NOTE — ED Provider Notes (Signed)
[facility] EMERGENCY DEPARTMENT Provider Note   CSN: [id number] Arrival date & time: 12/28/17  0754     History   Chief Complaint Chief Complaint  Patient presents with  . Headache    HPI Grace Bishop is a 32 y.o. female.  HPI  32 year old female with a history of chronic migraines presents with a recurrent migraine.  Headache is left-sided.  It has been there for about 1 week.  The headache feels like multiple prior headaches, starting in the front and over her maxillary sinuses and going to the left forehead.  She has not had any nausea or vomiting but has had some mild photophobia.  She tried multiple of her home medicines including aspirin, Reglan, baclofen.  Headache is about a 5/10 right now and is a sharp and throbbing pain.  No neck stiffness or fevers.  She states she recently got over the flu about a week and a half ago.  She states she has a residual mild cough but no shortness of breath.  No weakness or numbness in her extremities.  Headache feels just like multiple prior headaches.  Past Medical History:  Diagnosis Date  . Anxiety   . Lupus    joint pain  . Migraines   . Ovarian cyst   . Stomach problems   . Thyroid disease     Patient Active Problem List   Diagnosis Date Noted  . Acute intractable headache   . Altered mental status   . Encephalopathy 12/24/2016    Past Surgical History:  Procedure Laterality Date  . OVARIAN CYST REMOVAL    . TONSILLECTOMY      OB History    No data available       Home Medications    Prior to Admission medications   Medication Sig Start Date End Date Taking? Authorizing Provider  hydroxychloroquine (PLAQUENIL) 200 MG tablet Take by mouth daily.   Yes [provider]  baclofen (LIORESAL) 10 MG tablet Take 10 mg 3 (three) times daily as needed by mouth for muscle spasms.    [provider]  metoCLOPramide (REGLAN) 10 MG tablet Take 10 mg by mouth 4 (four) times daily.    [provider]  omeprazole (PRILOSEC) 20 MG capsule Take 20 mg daily by mouth.    [provider]  omeprazole (PRILOSEC) 20 MG capsule Take 1 capsule (20 mg total) daily by mouth. 09/24/17   Arby Barrette, MD  ondansetron (ZOFRAN) 4 MG tablet Take 1 tablet (4 mg total) by mouth every 6 (six) hours. 10/17/17   Law, Waylan Boga, PA-C  oseltamivir (TAMIFLU) 75 MG capsule Take 1 capsule (75 mg total) by mouth every 12 (twelve) hours. 12/15/17   Vanetta Mulders, MD  pantoprazole (PROTONIX) 20 MG tablet Take 1 tablet (20 mg total) by mouth daily. 10/17/17   Law, Waylan Boga, PA-C  sucralfate (CARAFATE) 1 GM/10ML suspension Take 10 mLs (1 g total) by mouth 4 (four) times daily -  with meals and at bedtime. 10/17/17   Emi Holes, PA-C    Family History Family History  Problem Relation Age of Onset  . Thyroid disease Mother   . Drug abuse Father   . Sickle cell trait Sister     Social History Social History   Tobacco Use  . Smoking status: Former Smoker    Years: 2.00  . Smokeless tobacco: Never Used  Substance Use Topics  . Alcohol use: No  . Drug use: No  Allergies   Patient has no known allergies.   Review of Systems Review of Systems  Constitutional: Negative for fever.  Eyes: Positive for photophobia. Negative for visual disturbance.  Respiratory: Positive for cough. Negative for shortness of breath.   Gastrointestinal: Negative for abdominal pain, nausea and vomiting.  Musculoskeletal: Negative for neck pain and neck stiffness.  Neurological: Positive for headaches. Negative for weakness and numbness.  All other systems reviewed and are negative.    Physical Exam Updated Vital Signs BP 129/90 (BP Location: Right Arm)   Pulse 72   Temp 98.4 F (36.9 C) (Oral)   Resp 18   Ht 5\' 2"  (1.575 m)   Wt 51.7 kg (114 lb)   LMP 12/16/2017   SpO2 100%   BMI 20.85 kg/m   Physical Exam  Constitutional: She is oriented to person, place, and time. She appears  well-developed and well-nourished.  Non-toxic appearance. She does not appear ill. No distress.  HENT:  Head: Normocephalic and atraumatic.  Right Ear: External ear normal.  Left Ear: External ear normal.  Nose: Nose normal.  Mouth/Throat: No oropharyngeal exudate.  Eyes: EOM are normal. Pupils are equal, round, and reactive to light. Right eye exhibits no discharge. Left eye exhibits no discharge.  Neck: Normal range of motion. Neck supple. No neck rigidity.  Cardiovascular: Normal rate, regular rhythm and normal heart sounds.  Pulmonary/Chest: Effort normal and breath sounds normal.  Abdominal: Soft. There is no tenderness.  Neurological: She is alert and oriented to person, place, and time.  CN 3-12 grossly intact. 5/5 strength in all 4 extremities. Grossly normal sensation. Normal finger to nose.   Skin: Skin is warm and dry.  Nursing note and vitals reviewed.    ED Treatments / Results  Labs (all labs ordered are listed, but only abnormal results are displayed) Labs Reviewed - No data to display  EKG  EKG Interpretation None       Radiology No results found.  Procedures Procedures (including critical care time)  Medications Ordered in ED Medications  sodium chloride 0.9 % bolus 1,000 mL (0 mLs Intravenous Stopped 12/28/17 1034)  metoCLOPramide (REGLAN) injection 10 mg (10 mg Intravenous Given 12/28/17 0916)  ketorolac (TORADOL) 15 MG/ML injection 15 mg (15 mg Intravenous Given 12/28/17 0918)  dexamethasone (DECADRON) injection 10 mg (10 mg Intravenous Given 12/28/17 0914)     Initial Impression / Assessment and Plan / ED Course  I have reviewed the triage vital signs and the nursing notes.  Pertinent labs & imaging results that were available during my care of the patient were reviewed by me and considered in my medical decision making (see chart for details).     Patient feels better after migraine treatment.  She declined the Benadryl and states she will just  take it at home.  Otherwise her exam is unremarkable and this seems like a recurrent migraine episode for her and she has a long-standing history of such.  My suspicion for acute CNS emergency such as meningitis/encephalitis, bleed, or acute stroke is low.  Discharge home with return precautions.  Follow-up with PCP.  Final Clinical Impressions(s) / ED Diagnoses   Final diagnoses:  Migraine with aura and without status migrainosus, not intractable    ED Discharge Orders    None       Pricilla LovelessGoldston, Keshana Klemz, MD 12/28/17 1547

## 2017-12-28 NOTE — ED Notes (Signed)
Bendryl already opened when pt refused; 50mg /622mL wasted in sharps container.

## 2018-02-16 ENCOUNTER — Encounter (HOSPITAL_BASED_OUTPATIENT_CLINIC_OR_DEPARTMENT_OTHER): Payer: Self-pay

## 2018-02-16 ENCOUNTER — Other Ambulatory Visit: Payer: Self-pay

## 2018-02-16 ENCOUNTER — Emergency Department (HOSPITAL_BASED_OUTPATIENT_CLINIC_OR_DEPARTMENT_OTHER)
Admission: EM | Admit: 2018-02-16 | Discharge: 2018-02-16 | Disposition: A | Discharge status: Home / Self Care | Payer: BLUE CROSS/BLUE SHIELD | Attending: Emergency Medicine | Admitting: Emergency Medicine

## 2018-02-16 DIAGNOSIS — G43101 Migraine with aura, not intractable, with status migrainosus: Secondary | ICD-10-CM | POA: Diagnosis not present

## 2018-02-16 DIAGNOSIS — Z87891 Personal history of nicotine dependence: Secondary | ICD-10-CM | POA: Diagnosis not present

## 2018-02-16 DIAGNOSIS — G43909 Migraine, unspecified, not intractable, without status migrainosus: Secondary | ICD-10-CM | POA: Diagnosis present

## 2018-02-16 DIAGNOSIS — Z79899 Other long term (current) drug therapy: Secondary | ICD-10-CM | POA: Diagnosis not present

## 2018-02-16 MED ORDER — DIPHENHYDRAMINE HCL 50 MG/ML IJ SOLN
25.0000 mg | Freq: Once | INTRAMUSCULAR | Status: AC
  Administered 2018-02-16: 25 mg via INTRAMUSCULAR
  Filled 2018-02-16: qty 1

## 2018-02-16 MED ORDER — KETOROLAC TROMETHAMINE 15 MG/ML IJ SOLN
15.0000 mg | Freq: Once | INTRAMUSCULAR | Status: AC
  Administered 2018-02-16: 15 mg via INTRAMUSCULAR
  Filled 2018-02-16: qty 1

## 2018-02-16 MED ORDER — METOCLOPRAMIDE HCL 5 MG/ML IJ SOLN
10.0000 mg | Freq: Once | INTRAMUSCULAR | Status: AC
  Administered 2018-02-16: 10 mg via INTRAMUSCULAR
  Filled 2018-02-16: qty 2

## 2018-02-16 NOTE — ED Notes (Signed)
ED Provider at bedside. 

## 2018-02-16 NOTE — ED Triage Notes (Signed)
C/o migraine x 3 hours-NAD-steady gait-drove self to ED

## 2018-02-16 NOTE — ED Provider Notes (Signed)
[facility] EMERGENCY DEPARTMENT Provider Note   CSN: [id number] Arrival date & time: 02/16/18  2030     History   Chief Complaint Chief Complaint  Patient presents with  . Migraine    HPI Grace Bishop is a 32 y.o. female.  32 yo F with a chief complaint of a headache.  Patient has a history of chronic migraines and thinks this feels the same.  Had an aura this morning and then had her headache come on over the course of the past 3 hours.  She denies vomiting but has had some nausea.  Denies unilateral numbness or weakness.  Was able to drive here without difficulty.  She tried her home regiment which includes oral Reglan without improvement.  The history is provided by the patient.  Headache   This is a new problem. The current episode started yesterday. The problem occurs constantly. The problem has not changed since onset.The headache is associated with bright light. The pain is located in the right unilateral region. The quality of the pain is described as throbbing. The pain is at a severity of 9/10. The pain is severe. The pain does not radiate. Pertinent negatives include no fever, no palpitations, no shortness of breath, no nausea and no vomiting. She has tried resting in a darkened room for the symptoms. The treatment provided no relief.    Past Medical History:  Diagnosis Date  . Anxiety   . Lupus (HCC)    joint pain  . Migraines   . Ovarian cyst   . Stomach problems   . Thyroid disease     Patient Active Problem List   Diagnosis Date Noted  . Acute intractable headache   . Altered mental status   . Encephalopathy 12/24/2016    Past Surgical History:  Procedure Laterality Date  . OVARIAN CYST REMOVAL    . TONSILLECTOMY       OB History   None      Home Medications    Prior to Admission medications   Medication Sig Start Date End Date Taking? Authorizing Provider  baclofen (LIORESAL) 10 MG tablet Take 10 mg 3 (three) times daily as  needed by mouth for muscle spasms.    [provider]  hydroxychloroquine (PLAQUENIL) 200 MG tablet Take by mouth daily.    [provider]  metoCLOPramide (REGLAN) 10 MG tablet Take 10 mg by mouth 4 (four) times daily.    [provider]  omeprazole (PRILOSEC) 20 MG capsule Take 20 mg daily by mouth.    [provider]  omeprazole (PRILOSEC) 20 MG capsule Take 1 capsule (20 mg total) daily by mouth. 09/24/17   Arby BarrettePfeiffer, Marcy, MD  ondansetron (ZOFRAN) 4 MG tablet Take 1 tablet (4 mg total) by mouth every 6 (six) hours. 10/17/17   Law, Waylan BogaAlexandra M, PA-C  oseltamivir (TAMIFLU) 75 MG capsule Take 1 capsule (75 mg total) by mouth every 12 (twelve) hours. 12/15/17   Vanetta MuldersZackowski, Scott, MD  pantoprazole (PROTONIX) 20 MG tablet Take 1 tablet (20 mg total) by mouth daily. 10/17/17   Law, Waylan BogaAlexandra M, PA-C  sucralfate (CARAFATE) 1 GM/10ML suspension Take 10 mLs (1 g total) by mouth 4 (four) times daily -  with meals and at bedtime. 10/17/17   Emi HolesLaw, Alexandra M, PA-C    Family History Family History  Problem Relation Age of Onset  . Thyroid disease Mother   . Drug abuse Father   . Sickle cell trait Sister  Social History Social History   Tobacco Use  . Smoking status: Former Smoker    Years: 2.00  . Smokeless tobacco: Never Used  Substance Use Topics  . Alcohol use: No  . Drug use: No     Allergies   Patient has no known allergies.   Review of Systems Review of Systems  Constitutional: Negative for chills and fever.  HENT: Negative for congestion and rhinorrhea.   Eyes: Negative for redness and visual disturbance.  Respiratory: Negative for shortness of breath and wheezing.   Cardiovascular: Negative for chest pain and palpitations.  Gastrointestinal: Negative for nausea and vomiting.  Genitourinary: Negative for dysuria and urgency.  Musculoskeletal: Negative for arthralgias and myalgias.  Skin: Negative for pallor and wound.  Neurological:  Positive for headaches. Negative for dizziness.     Physical Exam Updated Vital Signs BP 113/81 (BP Location: Left Arm)   Pulse 78   Temp 98.2 F (36.8 C) (Oral)   Resp 18   Ht 5\' 2"  (1.575 m)   Wt 54 kg (119 lb)   LMP 02/08/2018   SpO2 99%   BMI 21.77 kg/m   Physical Exam  Constitutional: She is oriented to person, place, and time. She appears well-developed and well-nourished. No distress.  HENT:  Head: Normocephalic and atraumatic.  Eyes: Pupils are equal, round, and reactive to light. EOM are normal.  Neck: Normal range of motion. Neck supple.  Cardiovascular: Normal rate and regular rhythm. Exam reveals no gallop and no friction rub.  No murmur heard. Pulmonary/Chest: Effort normal. She has no wheezes. She has no rales.  Abdominal: Soft. She exhibits no distension. There is no tenderness.  Musculoskeletal: She exhibits no edema or tenderness.  Neurological: She is alert and oriented to person, place, and time. She has normal strength. No cranial nerve deficit or sensory deficit. She displays a negative Romberg sign. Coordination and gait normal. GCS eye subscore is 4. GCS verbal subscore is 5. GCS motor subscore is 6.  Benign neuro exam  Skin: Skin is warm and dry. She is not diaphoretic.  Psychiatric: She has a normal mood and affect. Her behavior is normal.  Nursing note and vitals reviewed.    ED Treatments / Results  Labs (all labs ordered are listed, but only abnormal results are displayed) Labs Reviewed - No data to display  EKG None  Radiology No results found.  Procedures Procedures (including critical care time)  Medications Ordered in ED Medications  metoCLOPramide (REGLAN) injection 10 mg (10 mg Intramuscular Given 02/16/18 2059)  diphenhydrAMINE (BENADRYL) injection 25 mg (25 mg Intramuscular Given 02/16/18 2058)  ketorolac (TORADOL) 15 MG/ML injection 15 mg (15 mg Intramuscular Given 02/16/18 2101)     Initial Impression / Assessment and Plan /  ED Course  I have reviewed the triage vital signs and the nursing notes.  Pertinent labs & imaging results that were available during my care of the patient were reviewed by me and considered in my medical decision making (see chart for details).     32 yo F with a chief complaint of a headache.  Patient has a history of migraines and this feels the same.  Normally Reglan Toradol and Benadryl work and I will give her this cocktail. Feeling better post headache cocktail.  D/c home.   9:46 PM:  I have discussed the diagnosis/risks/treatment options with the patient and believe the pt to be eligible for discharge home to follow-up with Neurology. We also discussed returning to the ED  immediately if new or worsening sx occur. We discussed the sx which are most concerning (e.g., sudden worsening pain, fever, inability to tolerate by mouth) that necessitate immediate return. Medications administered to the patient during their visit and any new prescriptions provided to the patient are listed below.  Medications given during this visit Medications  metoCLOPramide (REGLAN) injection 10 mg (10 mg Intramuscular Given 02/16/18 2059)  diphenhydrAMINE (BENADRYL) injection 25 mg (25 mg Intramuscular Given 02/16/18 2058)  ketorolac (TORADOL) 15 MG/ML injection 15 mg (15 mg Intramuscular Given 02/16/18 2101)     The patient appears reasonably screen and/or stabilized for discharge and I doubt any other medical condition or other [facility] requiring further screening, evaluation, or treatment in the ED at this time prior to discharge.    Final Clinical Impressions(s) / ED Diagnoses   Final diagnoses:  Migraine with aura and with status migrainosus, not intractable    ED Discharge Orders    None       Melene Plan, DO 02/16/18 2146

## 2018-03-03 ENCOUNTER — Other Ambulatory Visit: Payer: Self-pay

## 2018-03-03 ENCOUNTER — Emergency Department (HOSPITAL_BASED_OUTPATIENT_CLINIC_OR_DEPARTMENT_OTHER)
Admission: EM | Admit: 2018-03-03 | Discharge: 2018-03-03 | Disposition: A | Discharge status: Home / Self Care | Payer: BLUE CROSS/BLUE SHIELD | Attending: Emergency Medicine | Admitting: Emergency Medicine

## 2018-03-03 ENCOUNTER — Emergency Department (HOSPITAL_BASED_OUTPATIENT_CLINIC_OR_DEPARTMENT_OTHER): Payer: BLUE CROSS/BLUE SHIELD

## 2018-03-03 ENCOUNTER — Encounter (HOSPITAL_BASED_OUTPATIENT_CLINIC_OR_DEPARTMENT_OTHER): Payer: Self-pay | Admitting: Emergency Medicine

## 2018-03-03 DIAGNOSIS — Z79899 Other long term (current) drug therapy: Secondary | ICD-10-CM | POA: Insufficient documentation

## 2018-03-03 DIAGNOSIS — B9789 Other viral agents as the cause of diseases classified elsewhere: Secondary | ICD-10-CM | POA: Diagnosis not present

## 2018-03-03 DIAGNOSIS — J069 Acute upper respiratory infection, unspecified: Secondary | ICD-10-CM | POA: Diagnosis not present

## 2018-03-03 DIAGNOSIS — R05 Cough: Secondary | ICD-10-CM

## 2018-03-03 DIAGNOSIS — Z87891 Personal history of nicotine dependence: Secondary | ICD-10-CM | POA: Diagnosis not present

## 2018-03-03 DIAGNOSIS — R059 Cough, unspecified: Secondary | ICD-10-CM

## 2018-03-03 MED ORDER — IBUPROFEN 800 MG PO TABS: 800.0000 mg | ORAL_TABLET | Freq: Three times a day (TID) | ORAL | 0 refills | Status: DC | PRN

## 2018-03-03 MED ORDER — GUAIFENESIN ER 1200 MG PO TB12: 1.0000 | ORAL_TABLET | Freq: Two times a day (BID) | ORAL | 0 refills | Status: DC

## 2018-03-03 MED ORDER — ACETAMINOPHEN-CODEINE 120-12 MG/5ML PO SOLN: 10.0000 mL | ORAL | 0 refills | Status: DC | PRN

## 2018-03-03 MED ORDER — SODIUM CHLORIDE 0.9 % IV BOLUS
1000.0000 mL | Freq: Once | INTRAVENOUS | Status: AC
  Administered 2018-03-03: 1000 mL via INTRAVENOUS

## 2018-03-03 NOTE — ED Triage Notes (Signed)
Cough and sore throat x 1 week.  

## 2018-03-03 NOTE — ED Notes (Signed)
Patient transported to X-ray 

## 2018-03-03 NOTE — Discharge Instructions (Addendum)
Return here as needed.  Follow-up with your primary doctor. °

## 2018-03-05 ENCOUNTER — Other Ambulatory Visit: Payer: Self-pay

## 2018-03-05 ENCOUNTER — Emergency Department (HOSPITAL_BASED_OUTPATIENT_CLINIC_OR_DEPARTMENT_OTHER)
Admission: EM | Admit: 2018-03-05 | Discharge: 2018-03-05 | Disposition: A | Discharge status: Home / Self Care | Payer: BLUE CROSS/BLUE SHIELD | Attending: Emergency Medicine | Admitting: Emergency Medicine

## 2018-03-05 ENCOUNTER — Encounter (HOSPITAL_BASED_OUTPATIENT_CLINIC_OR_DEPARTMENT_OTHER): Payer: Self-pay | Admitting: Emergency Medicine

## 2018-03-05 DIAGNOSIS — G43009 Migraine without aura, not intractable, without status migrainosus: Secondary | ICD-10-CM

## 2018-03-05 DIAGNOSIS — G2402 Drug induced acute dystonia: Secondary | ICD-10-CM | POA: Insufficient documentation

## 2018-03-05 DIAGNOSIS — Z79899 Other long term (current) drug therapy: Secondary | ICD-10-CM | POA: Diagnosis not present

## 2018-03-05 DIAGNOSIS — Z87891 Personal history of nicotine dependence: Secondary | ICD-10-CM | POA: Diagnosis not present

## 2018-03-05 DIAGNOSIS — G43909 Migraine, unspecified, not intractable, without status migrainosus: Secondary | ICD-10-CM | POA: Insufficient documentation

## 2018-03-05 DIAGNOSIS — R51 Headache: Secondary | ICD-10-CM | POA: Diagnosis present

## 2018-03-05 DIAGNOSIS — R6884 Jaw pain: Secondary | ICD-10-CM | POA: Diagnosis present

## 2018-03-05 MED ORDER — DIPHENHYDRAMINE HCL 25 MG PO CAPS
25.0000 mg | ORAL_CAPSULE | Freq: Once | ORAL | Status: AC
  Administered 2018-03-05: 25 mg via ORAL
  Filled 2018-03-05: qty 1

## 2018-03-05 MED ORDER — LORAZEPAM 2 MG/ML IJ SOLN
1.0000 mg | Freq: Once | INTRAMUSCULAR | Status: AC
  Administered 2018-03-05: 1 mg via INTRAVENOUS
  Filled 2018-03-05: qty 1

## 2018-03-05 MED ORDER — DEXAMETHASONE SODIUM PHOSPHATE 10 MG/ML IJ SOLN
10.0000 mg | Freq: Once | INTRAMUSCULAR | Status: DC
  Filled 2018-03-05: qty 1

## 2018-03-05 MED ORDER — DIPHENHYDRAMINE HCL 25 MG PO TABS: 25.0000 mg | ORAL_TABLET | Freq: Four times a day (QID) | ORAL | 0 refills | Status: DC

## 2018-03-05 MED ORDER — DIPHENHYDRAMINE HCL 50 MG/ML IJ SOLN
25.0000 mg | Freq: Once | INTRAMUSCULAR | Status: AC
  Administered 2018-03-05: 25 mg via INTRAVENOUS
  Filled 2018-03-05: qty 1

## 2018-03-05 MED ORDER — KETOROLAC TROMETHAMINE 15 MG/ML IJ SOLN
15.0000 mg | Freq: Once | INTRAMUSCULAR | Status: AC
Start: 2018-03-05 — End: 2018-03-05
  Administered 2018-03-05: 15 mg via INTRAVENOUS
  Filled 2018-03-05: qty 1

## 2018-03-05 MED ORDER — SODIUM CHLORIDE 0.9 % IV BOLUS
500.0000 mL | Freq: Once | INTRAVENOUS | Status: AC
  Administered 2018-03-05: 500 mL via INTRAVENOUS

## 2018-03-05 MED ORDER — PROCHLORPERAZINE EDISYLATE 10 MG/2ML IJ SOLN
10.0000 mg | Freq: Once | INTRAMUSCULAR | Status: AC
  Administered 2018-03-05: 10 mg via INTRAVENOUS
  Filled 2018-03-05: qty 2

## 2018-03-05 NOTE — ED Provider Notes (Signed)
[facility] EMERGENCY DEPARTMENT Provider Note   CSN: [id number] Arrival date & time: 03/05/18  1724     History   Chief Complaint No chief complaint on file.   HPI Grace Bishop is a 32 y.o. female.  Patient presents with acute onset of jaw clenching and jaw pain after emergency visit earlier today for migraine headache.  Patient was given Compazine, Benadryl, and Toradol at that time.  Symptoms started after discharge.  Patient also complains of being anxious.  Patient is having difficulty speaking at time of exam due to her symptoms.  She does not appear to have any other complaints. The onset of this condition was acute. The course is constant. Aggravating factors: none. Alleviating factors: none.       Past Medical History:  Diagnosis Date  . Anxiety   . Lupus (HCC)    joint pain  . Migraines   . Ovarian cyst   . Stomach problems   . Thyroid disease     Patient Active Problem List   Diagnosis Date Noted  . Acute intractable headache   . Altered mental status   . Encephalopathy 12/24/2016    Past Surgical History:  Procedure Laterality Date  . OVARIAN CYST REMOVAL    . TONSILLECTOMY       OB History   None      Home Medications    Prior to Admission medications   Medication Sig Start Date End Date Taking? Authorizing Provider  acetaminophen-codeine 120-12 MG/5ML solution Take 10 mLs by mouth every 4 (four) hours as needed for moderate pain. 03/03/18   Lawyer, Cristal Deer, PA-C  baclofen (LIORESAL) 10 MG tablet Take 10 mg 3 (three) times daily as needed by mouth for muscle spasms.    [provider]  Guaifenesin 1200 MG TB12 Take 1 tablet (1,200 mg total) by mouth 2 (two) times daily. 03/03/18   Lawyer, Cristal Deer, PA-C  hydroxychloroquine (PLAQUENIL) 200 MG tablet Take by mouth daily.    [provider]  ibuprofen (ADVIL,MOTRIN) 800 MG tablet Take 1 tablet (800 mg total) by mouth every 8 (eight) hours as needed. 03/03/18    Lawyer, Cristal Deer, PA-C  metoCLOPramide (REGLAN) 10 MG tablet Take 10 mg by mouth 4 (four) times daily.    [provider]  omeprazole (PRILOSEC) 20 MG capsule Take 20 mg daily by mouth.    [provider]  omeprazole (PRILOSEC) 20 MG capsule Take 1 capsule (20 mg total) daily by mouth. 09/24/17   Arby Barrette, MD  ondansetron (ZOFRAN) 4 MG tablet Take 1 tablet (4 mg total) by mouth every 6 (six) hours. 10/17/17   Law, Waylan Boga, PA-C  oseltamivir (TAMIFLU) 75 MG capsule Take 1 capsule (75 mg total) by mouth every 12 (twelve) hours. 12/15/17   Vanetta Mulders, MD  pantoprazole (PROTONIX) 20 MG tablet Take 1 tablet (20 mg total) by mouth daily. 10/17/17   Law, Waylan Boga, PA-C  sucralfate (CARAFATE) 1 GM/10ML suspension Take 10 mLs (1 g total) by mouth 4 (four) times daily -  with meals and at bedtime. 10/17/17   Emi Holes, PA-C    Family History Family History  Problem Relation Age of Onset  . Thyroid disease Mother   . Drug abuse Father   . Sickle cell trait Sister     Social History Social History   Tobacco Use  . Smoking status: Former Smoker    Years: 2.00  . Smokeless tobacco: Never Used  Substance Use Topics  .  Alcohol use: No  . Drug use: No     Allergies   Compazine [prochlorperazine]   Review of Systems Review of Systems  Constitutional: Negative for fever.  HENT: Negative for congestion, dental problem, rhinorrhea and sinus pressure.        Jaw pain and clenching.   Eyes: Negative for photophobia, discharge, redness and visual disturbance.  Respiratory: Negative for shortness of breath.   Cardiovascular: Negative for chest pain.  Gastrointestinal: Negative for nausea and vomiting.  Musculoskeletal: Negative for gait problem, neck pain and neck stiffness.  Skin: Negative for rash.  Neurological: Positive for speech difficulty. Negative for syncope, weakness, light-headedness, numbness and headaches.  Psychiatric/Behavioral: Negative  for confusion.     Physical Exam Updated Vital Signs BP (!) 155/95 (BP Location: Left Arm)   Pulse 98   Temp 98.5 F (36.9 C) (Oral)   Resp 18   Ht 5\' 2"  (1.575 m)   Wt 53.1 kg (117 lb)   LMP 02/08/2018   SpO2 100%   BMI 21.40 kg/m   Physical Exam  Constitutional: She appears well-developed and well-nourished.  HENT:  Head: Normocephalic and atraumatic.  Spasm of masseter musculature. Patient has difficulty but can open her mouth.   Eyes: Conjunctivae are normal.  Neck: Normal range of motion. Neck supple.  Pulmonary/Chest: No respiratory distress.  Neurological: She is alert.  Patient is alert. Can give history but difficult due to pain and jaw spasm. She follows directions appropriately.   Skin: Skin is warm and dry.  Psychiatric: She has a normal mood and affect.  Nursing note and vitals reviewed.    ED Treatments / Results  Labs (all labs ordered are listed, but only abnormal results are displayed) Labs Reviewed - No data to display  EKG None  Radiology No results found.  Procedures Procedures (including critical care time)  Medications Ordered in ED Medications  sodium chloride 0.9 % bolus 500 mL (500 mLs Intravenous New Bag/Given 03/05/18 1823)  diphenhydrAMINE (BENADRYL) injection 25 mg (25 mg Intravenous Given 03/05/18 1825)  LORazepam (ATIVAN) injection 1 mg (1 mg Intravenous Given 03/05/18 1823)     Initial Impression / Assessment and Plan / ED Course  I have reviewed the triage vital signs and the nursing notes.  Pertinent labs & imaging results that were available during my care of the patient were reviewed by me and considered in my medical decision making (see chart for details).     Patient seen and examined. Initial impression dystonic reaction from compazine. She will receive IV ativan and benadryl. Will reassess.    Vital signs reviewed and are as follows: BP (!) 155/95 (BP Location: Left Arm)   Pulse 98   Temp 98.5 F (36.9 C)  (Oral)   Resp 18   Ht 5\' 2"  (1.575 m)   Wt 53.1 kg (117 lb)   LMP 02/08/2018   SpO2 100%   BMI 21.40 kg/m    7:05 PM Patient doing better. Still has some pain. Talking more comfortably. States h/o similar reaction to compazine and decadron in past. Good ROM of jaw, does not seem dislocated.   Will PO challenge, anticipate d/c if continues to improve.     8:42 PM patient continues to do well.  We will discharge home.  Encouraged use of Benadryl as needed over the next 48 hours.  Encouraged avoidance of Compazine in the future.  Final Clinical Impressions(s) / ED Diagnoses   Final diagnoses:  Dystonic drug reaction   Patient  with suspected dystonic reaction after administration of Compazine earlier today.  Patient treated with Benadryl and Ativan with good improvement in symptoms.  Do not suspect infection in the soft tissue, mouth or neck.  Do not suspect jaw dislocation.  Patient is verbal, eating and drinking well.  ED Discharge Orders        Ordered    diphenhydrAMINE (BENADRYL) 25 MG tablet  Every 6 hours     03/05/18 2040       Renne Crigler, Cordelia Poche 03/05/18 2042    Cathren Laine, MD 03/05/18 2243

## 2018-03-05 NOTE — ED Provider Notes (Signed)
[facility] EMERGENCY DEPARTMENT Provider Note  CSN: [id number] Arrival date & time: 03/05/18 1191  Chief Complaint(s) Headache  HPI Grace Bishop is a 32 y.o. female   The history is provided by the patient.  Headache   This is a recurrent problem. The current episode started yesterday. The problem occurs constantly. The problem has been gradually worsening. The headache is associated with bright light. Pain location: calvaria. The quality of the pain is described as throbbing and dull. The pain is severe. The pain does not radiate. Associated symptoms include nausea. Pertinent negatives include no fever, no malaise/fatigue, no shortness of breath and no vomiting. Treatments tried: reglan and tylenol 3. The treatment provided no relief.    Past Medical History Past Medical History:  Diagnosis Date  . Anxiety   . Lupus (HCC)    joint pain  . Migraines   . Ovarian cyst   . Stomach problems   . Thyroid disease    Patient Active Problem List   Diagnosis Date Noted  . Acute intractable headache   . Altered mental status   . Encephalopathy 12/24/2016   Home Medication(s) Prior to Admission medications   Medication Sig Start Date End Date Taking? Authorizing Provider  acetaminophen-codeine 120-12 MG/5ML solution Take 10 mLs by mouth every 4 (four) hours as needed for moderate pain. 03/03/18   Lawyer, Cristal Deer, PA-C  baclofen (LIORESAL) 10 MG tablet Take 10 mg 3 (three) times daily as needed by mouth for muscle spasms.    [provider]  Guaifenesin 1200 MG TB12 Take 1 tablet (1,200 mg total) by mouth 2 (two) times daily. 03/03/18   Lawyer, Cristal Deer, PA-C  hydroxychloroquine (PLAQUENIL) 200 MG tablet Take by mouth daily.    [provider]  ibuprofen (ADVIL,MOTRIN) 800 MG tablet Take 1 tablet (800 mg total) by mouth every 8 (eight) hours as needed. 03/03/18   Lawyer, Cristal Deer, PA-C  metoCLOPramide (REGLAN) 10 MG tablet Take 10 mg by mouth 4 (four)  times daily.    [provider]  omeprazole (PRILOSEC) 20 MG capsule Take 20 mg daily by mouth.    [provider]  omeprazole (PRILOSEC) 20 MG capsule Take 1 capsule (20 mg total) daily by mouth. 09/24/17   Arby Barrette, MD  ondansetron (ZOFRAN) 4 MG tablet Take 1 tablet (4 mg total) by mouth every 6 (six) hours. 10/17/17   Law, Waylan Boga, PA-C  oseltamivir (TAMIFLU) 75 MG capsule Take 1 capsule (75 mg total) by mouth every 12 (twelve) hours. 12/15/17   Vanetta Mulders, MD  pantoprazole (PROTONIX) 20 MG tablet Take 1 tablet (20 mg total) by mouth daily. 10/17/17   Law, Waylan Boga, PA-C  sucralfate (CARAFATE) 1 GM/10ML suspension Take 10 mLs (1 g total) by mouth 4 (four) times daily -  with meals and at bedtime. 10/17/17   Emi Holes, PA-C  Past Surgical History Past Surgical History:  Procedure Laterality Date  . OVARIAN CYST REMOVAL    . TONSILLECTOMY     Family History Family History  Problem Relation Age of Onset  . Thyroid disease Mother   . Drug abuse Father   . Sickle cell trait Sister     Social History Social History   Tobacco Use  . Smoking status: Former Smoker    Years: 2.00  . Smokeless tobacco: Never Used  Substance Use Topics  . Alcohol use: No  . Drug use: No   Allergies Patient has no known allergies.  Review of Systems Review of Systems  Constitutional: Negative for fever and malaise/fatigue.  Respiratory: Negative for shortness of breath.   Gastrointestinal: Positive for nausea. Negative for vomiting.  Neurological: Positive for headaches.   All other systems are reviewed and are negative for acute change except as noted in the HPI  Physical Exam Vital Signs  I have reviewed the triage vital signs BP 119/78 (BP Location: Left Arm)   Pulse 80   Temp 98.4 F (36.9 C) (Oral)   Resp 14   Ht 5\' 2"   (1.575 m)   Wt 53.1 kg (117 lb)   LMP 02/08/2018   SpO2 100%   BMI 21.40 kg/m   Physical Exam  Constitutional: She is oriented to person, place, and time. She appears well-developed and well-nourished. No distress.  HENT:  Head: Normocephalic and atraumatic.  Right Ear: External ear normal.  Left Ear: External ear normal.  Nose: Nose normal.  Eyes: Conjunctivae and EOM are normal. No scleral icterus.  Neck: Normal range of motion and phonation normal.  Cardiovascular: Normal rate and regular rhythm.  Pulmonary/Chest: Effort normal. No stridor. No respiratory distress.  Abdominal: She exhibits no distension.  Musculoskeletal: Normal range of motion. She exhibits no edema.  Neurological: She is alert and oriented to person, place, and time.  Mental Status:  Alert and oriented to person, place, and time.  Attention and concentration normal.  Speech clear.  Recent memory is intact  Cranial Nerves:  II Visual Fields: Intact to confrontation. Visual fields intact. III, IV, VI: Pupils equal and reactive to light and near. Full eye movement without nystagmus  V Facial Sensation: Normal. No weakness of masticatory muscles  VII: No facial weakness or asymmetry  VIII Auditory Acuity: Grossly normal  IX/X: The uvula is midline; the palate elevates symmetrically  XI: Normal sternocleidomastoid and trapezius strength  XII: The tongue is midline. No atrophy or fasciculations.   Motor System: Muscle Strength: 5/5 and symmetric in the upper and lower extremities. No pronation or drift.  Muscle Tone: Tone and muscle bulk are normal in the upper and lower extremities.   Reflexes: DTRs: 1+ and symmetrical in all four extremities. No Clonus Coordination: Intact finger-to-nose, heel-to-shin. No tremor.  Sensation: Intact to light touch, and pinprick.  Gait: deferred    Skin: She is not diaphoretic.  Psychiatric: She has a normal mood and affect. Her behavior is normal.  Vitals  reviewed.   ED Results and Treatments Labs (all labs ordered are listed, but only abnormal results are displayed) Labs Reviewed - No data to display  EKG  EKG Interpretation  Date/Time:    Ventricular Rate:    PR Interval:    QRS Duration:   QT Interval:    QTC Calculation:   R Axis:     Text Interpretation:        Radiology No results found. Pertinent labs & imaging results that were available during my care of the patient were reviewed by me and considered in my medical decision making (see chart for details).  Medications Ordered in ED Medications  dexamethasone (DECADRON) injection 10 mg (10 mg Intravenous Refused 03/05/18 0931)  diphenhydrAMINE (BENADRYL) injection 25 mg (25 mg Intravenous Given 03/05/18 0930)  prochlorperazine (COMPAZINE) injection 10 mg (10 mg Intravenous Given 03/05/18 0929)  sodium chloride 0.9 % bolus 500 mL (0 mLs Intravenous Stopped 03/05/18 1001)  ketorolac (TORADOL) 15 MG/ML injection 15 mg (15 mg Intravenous Given 03/05/18 0936)  diphenhydrAMINE (BENADRYL) capsule 25 mg (25 mg Oral Given 03/05/18 1001)                                                                                                                                    Procedures Procedures  (including critical care time)  Medical Decision Making / ED Course I have reviewed the nursing notes for this encounter and the patient's prior records (if available in EHR or on provided paperwork).    Typical migraine headache for the pt. Non focal neuro exam. No recent head trauma. No fever. Doubt meningitis. Doubt SLE cerebritis. Doubt intracranial bleed. Doubt IIH. No indication for imaging. Will treat with migraine cocktail and reevaluate.  11:10 AM Reported significant improvement.  The patient appears reasonably screened and/or stabilized for discharge and I doubt any  other medical condition or other [facility] requiring further screening, evaluation, or treatment in the ED at this time prior to discharge.  The patient is safe for discharge with strict return precautions.   Final Clinical Impression(s) / ED Diagnoses Final diagnoses:  Migraine without aura and without status migrainosus, not intractable    Disposition: Discharge  Condition: Good  I have discussed the results, Dx and Tx plan with the patient who expressed understanding and agree(s) with the plan. Discharge instructions discussed at great length. The patient was given strict return precautions who verbalized understanding of the instructions. No further questions at time of discharge.    ED Discharge Orders    None       Follow Up: [place] [address] [place] [address] [phone]  Schedule an appointment as soon as possible for a visit  As needed     This chart was dictated using voice recognition software.  Despite best efforts to proofread,  errors can occur which can change the documentation meaning.   Nira Conn, MD 03/05/18 1110

## 2018-03-05 NOTE — Discharge Instructions (Signed)
Please read and follow all provided instructions.  Your diagnoses today include:  1. Dystonic drug reaction     Tests performed today include:  Vital signs. See below for your results today.   Medications prescribed:   Benadryl - medication to help with abnormal drug reaction.   Take any prescribed medications only as directed.  Home care instructions:  Follow any educational materials contained in this packet.  BE VERY CAREFUL not to take multiple medicines containing Tylenol (also called acetaminophen). Doing so can lead to an overdose which can damage your liver and cause liver failure and possibly death.   Follow-up instructions: Please follow-up with your primary care provider in the next 3 days for further evaluation of your symptoms.   Return instructions:   Please return to the Emergency Department if you experience worsening symptoms.   Please return if you have any other emergent concerns.  Additional Information:  Your vital signs today were: BP 104/73 (BP Location: Left Arm)    Pulse 92    Temp 98.5 F (36.9 C) (Oral)    Resp 18    Ht 5\' 2"  (1.575 m)    Wt 53.1 kg (117 lb)    LMP 02/08/2018    SpO2 100%    BMI 21.40 kg/m  If your blood pressure (BP) was elevated above 135/85 this visit, please have this repeated by your doctor within one month. --------------

## 2018-03-05 NOTE — ED Notes (Addendum)
Pt c/o adverse effect due to compazine, states her teeth are chattering and she feels anxious. EDP made aware

## 2018-03-05 NOTE — ED Triage Notes (Signed)
Headache since last night, hx of migraines and states she took her home medication without relief.

## 2018-03-05 NOTE — ED Notes (Signed)
Pt request to be discharged home, reports feels better pain is at a 1.

## 2018-03-05 NOTE — ED Notes (Signed)
Pt states she was able to drink Ginger Ale without any difficulty.

## 2018-03-05 NOTE — ED Triage Notes (Addendum)
Patient was here this morning for a Migraine HA - she was given a medication that she states "causes her teeth to clench and her tongue to twist up" - the patient originally would not answer questions or open her mouth, after coaxing she is able to talk. Patient is clenching her haw but is able to open it when this RN needed to take her temperature. The patient has had a broken tooth to the right side of her mouth. Patient states that he has this reaction to the steroids as well  - She is also able to answer her phone and talk on it.

## 2018-03-05 NOTE — ED Notes (Signed)
ED Provider at bedside. 

## 2018-03-07 NOTE — ED Provider Notes (Signed)
[facility] EMERGENCY DEPARTMENT Provider Note   CSN: [id number] Arrival date & time: 03/03/18  0911     History   Chief Complaint Chief Complaint  Patient presents with  . Cough  . Sore Throat    HPI Grace Bishop is a 32 y.o. female.  HPI Patient presents to the emergency department with cough, sore throat, nasal congestion over the last week.  The patient states that she attempted to take over-the-counter medications without significant relief of her symptoms.  Patient states nothing seems to make the condition better or worse.  She states she was seen by her primary doctor for similar symptoms.  The patient states that the sore throat is exacerbated by swallowing.  The patient denies chest pain, shortness of breath, headache,blurred vision, neck pain, fever,weakness, numbness, dizziness, anorexia, edema, abdominal pain, nausea, vomiting, diarrhea, rash, back pain, dysuria, hematemesis, bloody stool, near syncope, or syncope. Past Medical History:  Diagnosis Date  . Anxiety   . Lupus (HCC)    joint pain  . Migraines   . Ovarian cyst   . Stomach problems   . Thyroid disease     Patient Active Problem List   Diagnosis Date Noted  . Acute intractable headache   . Altered mental status   . Encephalopathy 12/24/2016    Past Surgical History:  Procedure Laterality Date  . OVARIAN CYST REMOVAL    . TONSILLECTOMY       OB History   None      Home Medications    Prior to Admission medications   Medication Sig Start Date End Date Taking? Authorizing Provider  baclofen (LIORESAL) 10 MG tablet Take 10 mg 3 (three) times daily as needed by mouth for muscle spasms.   Yes [provider]  hydroxychloroquine (PLAQUENIL) 200 MG tablet Take by mouth daily.   Yes [provider]  metoCLOPramide (REGLAN) 10 MG tablet Take 10 mg by mouth 4 (four) times daily.   Yes [provider]  omeprazole (PRILOSEC) 20 MG capsule Take 20 mg daily by  mouth.   Yes [provider]  omeprazole (PRILOSEC) 20 MG capsule Take 1 capsule (20 mg total) daily by mouth. 09/24/17  Yes Arby Barrette, MD  acetaminophen-codeine 120-12 MG/5ML solution Take 10 mLs by mouth every 4 (four) hours as needed for moderate pain. 03/03/18   Maksim Peregoy, Cristal Deer, PA-C  diphenhydrAMINE (BENADRYL) 25 MG tablet Take 1 tablet (25 mg total) by mouth every 6 (six) hours. 03/05/18   Renne Crigler, PA-C  Guaifenesin 1200 MG TB12 Take 1 tablet (1,200 mg total) by mouth 2 (two) times daily. 03/03/18   Izaias Krupka, Cristal Deer, PA-C  ibuprofen (ADVIL,MOTRIN) 800 MG tablet Take 1 tablet (800 mg total) by mouth every 8 (eight) hours as needed. 03/03/18   Meckenzie Balsley, Cristal Deer, PA-C  ondansetron (ZOFRAN) 4 MG tablet Take 1 tablet (4 mg total) by mouth every 6 (six) hours. 10/17/17   Law, Waylan Boga, PA-C  oseltamivir (TAMIFLU) 75 MG capsule Take 1 capsule (75 mg total) by mouth every 12 (twelve) hours. 12/15/17   Vanetta Mulders, MD  pantoprazole (PROTONIX) 20 MG tablet Take 1 tablet (20 mg total) by mouth daily. 10/17/17   Law, Waylan Boga, PA-C  sucralfate (CARAFATE) 1 GM/10ML suspension Take 10 mLs (1 g total) by mouth 4 (four) times daily -  with meals and at bedtime. 10/17/17   Emi Holes, PA-C    Family History Family History  Problem Relation Age of Onset  . Thyroid disease  Mother   . Drug abuse Father   . Sickle cell trait Sister     Social History Social History   Tobacco Use  . Smoking status: Former Smoker    Years: 2.00  . Smokeless tobacco: Never Used  Substance Use Topics  . Alcohol use: No  . Drug use: No     Allergies   Compazine [prochlorperazine]   Review of Systems Review of Systems All other systems negative except as documented in the HPI. All pertinent positives and negatives as reviewed in the HPI.  Physical Exam Updated Vital Signs BP 104/73 (BP Location: Left Arm)   Pulse 72   Temp 98.4 F (36.9 C)   Resp 16   Ht 5\' 2"  (1.575  m)   Wt 54.4 kg (120 lb)   LMP 02/08/2018   SpO2 100%   BMI 21.95 kg/m   Physical Exam  Constitutional: She is oriented to person, place, and time. She appears well-developed and well-nourished. No distress.  HENT:  Head: Normocephalic and atraumatic.  Mouth/Throat: Oropharynx is clear and moist.  Eyes: Pupils are equal, round, and reactive to light.  Neck: Normal range of motion. Neck supple.  Cardiovascular: Normal rate, regular rhythm and normal heart sounds. Exam reveals no gallop and no friction rub.  No murmur heard. Pulmonary/Chest: Effort normal and breath sounds normal. No respiratory distress. She has no wheezes.  Abdominal: Soft. Bowel sounds are normal. She exhibits no distension. There is no tenderness.  Neurological: She is alert and oriented to person, place, and time. She exhibits normal muscle tone. Coordination normal.  Skin: Skin is warm and dry. Capillary refill takes less than 2 seconds. No rash noted. No erythema.  Psychiatric: She has a normal mood and affect. Her behavior is normal.  Nursing note and vitals reviewed.    ED Treatments / Results  Labs (all labs ordered are listed, but only abnormal results are displayed) Labs Reviewed - No data to display  EKG None  Radiology No results found.  Procedures Procedures (including critical care time)  Medications Ordered in ED Medications  sodium chloride 0.9 % bolus 1,000 mL (0 mLs Intravenous Stopped 03/03/18 1144)     Initial Impression / Assessment and Plan / ED Course  I have reviewed the triage vital signs and the nursing notes.  Pertinent labs & imaging results that were available during my care of the patient were reviewed by me and considered in my medical decision making (see chart for details).     She will be treated for a viral URI with cough.  Told return here as worsening in her condition.  Patient agrees the plan and all questions were answered.  I have advised her to follow-up with  her primary doctor as well.  Final Clinical Impressions(s) / ED Diagnoses   Final diagnoses:  Viral URI with cough  Cough    ED Discharge Orders        Ordered    Guaifenesin 1200 MG TB12  2 times daily     03/03/18 1214    acetaminophen-codeine 120-12 MG/5ML solution  Every 4 hours PRN     03/03/18 1214    ibuprofen (ADVIL,MOTRIN) 800 MG tablet  Every 8 hours PRN     03/03/18 1214       Charlestine NightLawyer, Darnice Comrie, PA-C 03/07/18 1501    Melene PlanFloyd, Dan, DO 03/07/18 1537

## 2018-03-22 ENCOUNTER — Emergency Department (HOSPITAL_BASED_OUTPATIENT_CLINIC_OR_DEPARTMENT_OTHER)
Admission: EM | Admit: 2018-03-22 | Discharge: 2018-03-22 | Discharge status: Against Medical Advice | Payer: BLUE CROSS/BLUE SHIELD | Source: Home / Self Care

## 2018-03-22 ENCOUNTER — Encounter (HOSPITAL_BASED_OUTPATIENT_CLINIC_OR_DEPARTMENT_OTHER): Payer: Self-pay

## 2018-03-22 ENCOUNTER — Emergency Department (HOSPITAL_BASED_OUTPATIENT_CLINIC_OR_DEPARTMENT_OTHER)
Admission: EM | Admit: 2018-03-22 | Discharge: 2018-03-22 | Disposition: A | Discharge status: Home / Self Care | Payer: BLUE CROSS/BLUE SHIELD | Attending: Emergency Medicine | Admitting: Emergency Medicine

## 2018-03-22 DIAGNOSIS — Z79899 Other long term (current) drug therapy: Secondary | ICD-10-CM | POA: Insufficient documentation

## 2018-03-22 DIAGNOSIS — Z87891 Personal history of nicotine dependence: Secondary | ICD-10-CM | POA: Insufficient documentation

## 2018-03-22 DIAGNOSIS — R002 Palpitations: Secondary | ICD-10-CM | POA: Diagnosis present

## 2018-03-22 NOTE — ED Provider Notes (Signed)
[facility] EMERGENCY DEPARTMENT Provider Note   CSN: [id number] Arrival date & time: 03/22/18  0809     History   Chief Complaint Chief Complaint  Patient presents with  . Fatigue    HPI Grace Bishop is a 32 y.o. female.  32 yo  F with a chief complaint of palpitations.  This woke her up about 3 this morning.  It then resolved a couple hours later.  Afterwards the patient felt very tired.  She denies any chest pain shortness of breath abdominal pain vomiting or diarrhea.  She has been trying a new vitamin supplement that she thinks may have been the cause.  She has been eating and drinking well but is trying a new diet where she is eating raw foods.  She denies any raw shellfish or oysters.  Denies raw chicken or meat.  She currently feels much better than she did when she came in.  The history is provided by the patient.  Illness  This is a new problem. The current episode started yesterday. The problem occurs constantly. The problem has not changed since onset.Pertinent negatives include no chest pain, no abdominal pain, no headaches and no shortness of breath. Nothing aggravates the symptoms. Nothing relieves the symptoms. She has tried nothing for the symptoms. The treatment provided no relief.    Past Medical History:  Diagnosis Date  . Anxiety   . Lupus (HCC)    joint pain  . Migraines   . Ovarian cyst   . Stomach problems   . Thyroid disease     Patient Active Problem List   Diagnosis Date Noted  . Acute intractable headache   . Altered mental status   . Encephalopathy 12/24/2016    Past Surgical History:  Procedure Laterality Date  . OVARIAN CYST REMOVAL    . TONSILLECTOMY       OB History   None      Home Medications    Prior to Admission medications   Medication Sig Start Date End Date Taking? Authorizing Provider  acetaminophen-codeine 120-12 MG/5ML solution Take 10 mLs by mouth every 4 (four) hours as needed for moderate pain.  03/03/18   Lawyer, Cristal Deer, PA-C  baclofen (LIORESAL) 10 MG tablet Take 10 mg 3 (three) times daily as needed by mouth for muscle spasms.    [provider]  diphenhydrAMINE (BENADRYL) 25 MG tablet Take 1 tablet (25 mg total) by mouth every 6 (six) hours. 03/05/18   Renne Crigler, PA-C  Guaifenesin 1200 MG TB12 Take 1 tablet (1,200 mg total) by mouth 2 (two) times daily. 03/03/18   Lawyer, Cristal Deer, PA-C  hydroxychloroquine (PLAQUENIL) 200 MG tablet Take by mouth daily.    [provider]  ibuprofen (ADVIL,MOTRIN) 800 MG tablet Take 1 tablet (800 mg total) by mouth every 8 (eight) hours as needed. 03/03/18   Lawyer, Cristal Deer, PA-C  metoCLOPramide (REGLAN) 10 MG tablet Take 10 mg by mouth 4 (four) times daily.    [provider]  omeprazole (PRILOSEC) 20 MG capsule Take 20 mg daily by mouth.    [provider]  omeprazole (PRILOSEC) 20 MG capsule Take 1 capsule (20 mg total) daily by mouth. 09/24/17   Arby Barrette, MD  ondansetron (ZOFRAN) 4 MG tablet Take 1 tablet (4 mg total) by mouth every 6 (six) hours. 10/17/17   Law, Waylan Boga, PA-C  pantoprazole (PROTONIX) 20 MG tablet Take 1 tablet (20 mg total) by mouth daily. 10/17/17   Emi Holes, PA-C  sucralfate (CARAFATE) 1 GM/10ML suspension Take 10 mLs (1 g total) by mouth 4 (four) times daily -  with meals and at bedtime. 10/17/17   Emi Holes, PA-C    Family History Family History  Problem Relation Age of Onset  . Thyroid disease Mother   . Drug abuse Father   . Sickle cell trait Sister     Social History Social History   Tobacco Use  . Smoking status: Former Smoker    Years: 2.00  . Smokeless tobacco: Never Used  Substance Use Topics  . Alcohol use: No  . Drug use: No     Allergies   Compazine [prochlorperazine]   Review of Systems Review of Systems  Constitutional: Negative for chills and fever.  HENT: Negative for congestion and rhinorrhea.   Eyes: Negative for  redness and visual disturbance.  Respiratory: Negative for shortness of breath and wheezing.   Cardiovascular: Positive for palpitations. Negative for chest pain.  Gastrointestinal: Negative for abdominal pain, nausea and vomiting.  Genitourinary: Negative for dysuria and urgency.  Musculoskeletal: Negative for arthralgias and myalgias.  Skin: Negative for pallor and wound.  Neurological: Positive for weakness (generalized). Negative for dizziness and headaches.     Physical Exam Updated Vital Signs BP 115/75 (BP Location: Right Arm)   Pulse 81   Temp 98.4 F (36.9 C) (Oral)   Resp 14   Ht  (1.575 m)   Wt 53.1 kg (117 lb)   LMP 03/11/2018   SpO2 100%   BMI 21.40 kg/m   Physical Exam  Constitutional: She is oriented to person, place, and time. She appears well-developed and well-nourished. No distress.  HENT:  Head: Normocephalic and atraumatic.  Eyes: Pupils are equal, round, and reactive to light. EOM are normal.  Neck: Normal range of motion. Neck supple.  Cardiovascular: Normal rate and regular rhythm. Exam reveals no gallop and no friction rub.  No murmur heard. Pulmonary/Chest: Effort normal. She has no wheezes. She has no rales.  Abdominal: Soft. She exhibits no distension and no mass. There is no tenderness. There is no guarding.  Musculoskeletal: She exhibits no edema or tenderness.  Neurological: She is alert and oriented to person, place, and time.  Skin: Skin is warm and dry. She is not diaphoretic.  Psychiatric: She has a normal mood and affect. Her behavior is normal.  Nursing note and vitals reviewed.    ED Treatments / Results  Labs (all labs ordered are listed, but only abnormal results are displayed) Labs Reviewed - No data to display  EKG EKG Interpretation  Date/Time:  Tuesday Mar 22 2018 08:18:48 EDT Ventricular Rate:  79 PR Interval:    QRS Duration: 84 QT Interval:  370 QTC Calculation: 425 R Axis:   11 Text Interpretation:  Sinus  rhythm No significant change since last tracing Confirmed by Melene Plan [phone]) on 03/22/2018 9:47:42 AM   Radiology No results found.  Procedures Procedures (including critical care time)  Medications Ordered in ED Medications - No data to display   Initial Impression / Assessment and Plan / ED Course  I have reviewed the triage vital signs and the nursing notes.  Pertinent labs & imaging results that were available during my care of the patient were reviewed by me and considered in my medical decision making (see chart for details).     32 yo F with a significant past medical history of lupus comes in with a chief complaint of palpitations and then generalized fatigue.  This could have been SVT based on her history.  She is currently feeling much better and requesting to go to work.  I do not feel that labs would be helpful.  EKG without concerning finding.  Discharge home for PCP follow-up.  9:48 AM:  I have discussed the diagnosis/risks/treatment options with the patient and believe the pt to be eligible for discharge home to follow-up with PCP. We also discussed returning to the ED immediately if new or worsening sx occur. We discussed the sx which are most concerning (e.g., sudden worsening pain, fever, inability to tolerate by mouth ) that necessitate immediate return. Medications administered to the patient during their visit and any new prescriptions provided to the patient are listed below.  Medications given during this visit Medications - No data to display    The patient appears reasonably screen and/or stabilized for discharge and I doubt any other medical condition or other [facility] requiring further screening, evaluation, or treatment in the ED at this time prior to discharge.    Final Clinical Impressions(s) / ED Diagnoses   Final diagnoses:  Palpitations    ED Discharge Orders    None       Melene Plan, DO 03/22/18 [phone]

## 2018-03-22 NOTE — Discharge Instructions (Signed)
Eat and drink well for the next couple of days.  Return for sudden worsening symptoms.  Follow up with your PCP, you may need a holter monitor.

## 2018-03-22 NOTE — ED Notes (Signed)
Pt states she doesn't want to wait to be seen,  Pt states she doesn't want to be billed either.

## 2018-03-22 NOTE — ED Triage Notes (Signed)
Pt states started some new vitamins on Saturday, had heart palpitations for over 3hrs, states didn't rest well and now feels tired and weak; states feels like she didn't eat enough yesterday

## 2018-03-24 ENCOUNTER — Emergency Department (HOSPITAL_BASED_OUTPATIENT_CLINIC_OR_DEPARTMENT_OTHER)
Admission: EM | Admit: 2018-03-24 | Discharge: 2018-03-24 | Disposition: A | Discharge status: Home / Self Care | Payer: BLUE CROSS/BLUE SHIELD | Attending: Emergency Medicine | Admitting: Emergency Medicine

## 2018-03-24 ENCOUNTER — Encounter (HOSPITAL_BASED_OUTPATIENT_CLINIC_OR_DEPARTMENT_OTHER): Payer: Self-pay | Admitting: Emergency Medicine

## 2018-03-24 ENCOUNTER — Other Ambulatory Visit: Payer: Self-pay

## 2018-03-24 DIAGNOSIS — R202 Paresthesia of skin: Secondary | ICD-10-CM | POA: Insufficient documentation

## 2018-03-24 DIAGNOSIS — R519 Headache, unspecified: Secondary | ICD-10-CM

## 2018-03-24 DIAGNOSIS — Z79899 Other long term (current) drug therapy: Secondary | ICD-10-CM | POA: Diagnosis not present

## 2018-03-24 DIAGNOSIS — Z87891 Personal history of nicotine dependence: Secondary | ICD-10-CM | POA: Insufficient documentation

## 2018-03-24 DIAGNOSIS — E039 Hypothyroidism, unspecified: Secondary | ICD-10-CM | POA: Diagnosis not present

## 2018-03-24 DIAGNOSIS — R410 Disorientation, unspecified: Secondary | ICD-10-CM | POA: Diagnosis not present

## 2018-03-24 DIAGNOSIS — R51 Headache: Secondary | ICD-10-CM | POA: Diagnosis present

## 2018-03-24 LAB — CBG MONITORING, ED: Glucose-Capillary: 85 mg/dL (ref 65–99)

## 2018-03-24 MED ORDER — SODIUM CHLORIDE 0.9 % IV BOLUS
500.0000 mL | Freq: Once | INTRAVENOUS | Status: AC
  Administered 2018-03-24: 500 mL via INTRAVENOUS

## 2018-03-24 MED ORDER — KETOROLAC TROMETHAMINE 15 MG/ML IJ SOLN
15.0000 mg | Freq: Once | INTRAMUSCULAR | Status: AC
  Administered 2018-03-24: 15 mg via INTRAVENOUS
  Filled 2018-03-24: qty 1

## 2018-03-24 NOTE — ED Triage Notes (Signed)
Headache today. Pt states it is because she was not able to eat enough.

## 2018-03-24 NOTE — ED Provider Notes (Signed)
[facility] EMERGENCY DEPARTMENT Provider Note   CSN: [id number] Arrival date & time: 03/24/18  1523     History   Chief Complaint Chief Complaint  Patient presents with  . Headache    HPI Grace Bishop is a 32 y.o. female.  Patient with h/o lupus, chronic headaches -- presents with c/o HA and confusion that patient relates to episodes of low blood sugar.  She states that when she feels this way, she eats and typically will feel better.  Patient was eating raw foods today.  States now she is starting to feel confused with tingling in her fingers.  Headache is mild compared to most but has the same character as previous headaches.  Patient is allergic to Compazine. No fevers, vision changes, neck pain.  No reported head injury.  No treatments prior to arrival. The onset of this condition was acute. The course is constant. Aggravating factors: none. Alleviating factors: oral intake.       Past Medical History:  Diagnosis Date  . Anxiety   . Lupus (HCC)    joint pain  . Migraines   . Ovarian cyst   . Stomach problems   . Thyroid disease     Patient Active Problem List   Diagnosis Date Noted  . Acute intractable headache   . Altered mental status   . Encephalopathy 12/24/2016    Past Surgical History:  Procedure Laterality Date  . OVARIAN CYST REMOVAL    . TONSILLECTOMY       OB History   None      Home Medications    Prior to Admission medications   Medication Sig Start Date End Date Taking? Authorizing Provider  acetaminophen-codeine 120-12 MG/5ML solution Take 10 mLs by mouth every 4 (four) hours as needed for moderate pain. 03/03/18   Lawyer, Cristal Deer, PA-C  baclofen (LIORESAL) 10 MG tablet Take 10 mg 3 (three) times daily as needed by mouth for muscle spasms.    [provider]  diphenhydrAMINE (BENADRYL) 25 MG tablet Take 1 tablet (25 mg total) by mouth every 6 (six) hours. 03/05/18   Renne Crigler, PA-C  Guaifenesin 1200 MG TB12  Take 1 tablet (1,200 mg total) by mouth 2 (two) times daily. 03/03/18   Lawyer, Cristal Deer, PA-C  hydroxychloroquine (PLAQUENIL) 200 MG tablet Take by mouth daily.    [provider]  ibuprofen (ADVIL,MOTRIN) 800 MG tablet Take 1 tablet (800 mg total) by mouth every 8 (eight) hours as needed. 03/03/18   Lawyer, Cristal Deer, PA-C  metoCLOPramide (REGLAN) 10 MG tablet Take 10 mg by mouth 4 (four) times daily.    [provider]  omeprazole (PRILOSEC) 20 MG capsule Take 20 mg daily by mouth.    [provider]  omeprazole (PRILOSEC) 20 MG capsule Take 1 capsule (20 mg total) daily by mouth. 09/24/17   Arby Barrette, MD  ondansetron (ZOFRAN) 4 MG tablet Take 1 tablet (4 mg total) by mouth every 6 (six) hours. 10/17/17   Law, Waylan Boga, PA-C  pantoprazole (PROTONIX) 20 MG tablet Take 1 tablet (20 mg total) by mouth daily. 10/17/17   Law, Waylan Boga, PA-C  sucralfate (CARAFATE) 1 GM/10ML suspension Take 10 mLs (1 g total) by mouth 4 (four) times daily -  with meals and at bedtime. 10/17/17   Emi Holes, PA-C    Family History Family History  Problem Relation Age of Onset  . Thyroid disease Mother   . Drug abuse Father   . Sickle cell  trait Sister     Social History Social History   Tobacco Use  . Smoking status: Former Smoker    Years: 2.00  . Smokeless tobacco: Never Used  Substance Use Topics  . Alcohol use: No  . Drug use: No     Allergies   Compazine [prochlorperazine]   Review of Systems Review of Systems  Constitutional: Negative for fever.  HENT: Negative for congestion, dental problem, rhinorrhea and sinus pressure.   Eyes: Negative for photophobia, discharge, redness and visual disturbance.  Respiratory: Negative for shortness of breath.   Cardiovascular: Negative for chest pain.  Gastrointestinal: Negative for nausea and vomiting.  Musculoskeletal: Negative for gait problem, neck pain and neck stiffness.  Skin: Negative for rash.    Neurological: Positive for numbness and headaches. Negative for syncope, speech difficulty, weakness and light-headedness.  Psychiatric/Behavioral: Positive for confusion.     Physical Exam Updated Vital Signs BP 115/77 (BP Location: Right Arm)   Pulse 88   Temp 99 F (37.2 C) (Oral)   Resp 18   LMP 03/11/2018   SpO2 100%   Physical Exam  Constitutional: She is oriented to person, place, and time. She appears well-developed and well-nourished.  HENT:  Head: Normocephalic and atraumatic.  Right Ear: Tympanic membrane, external ear and ear canal normal.  Left Ear: Tympanic membrane, external ear and ear canal normal.  Nose: Nose normal.  Mouth/Throat: Uvula is midline, oropharynx is clear and moist and mucous membranes are normal.  Eyes: Pupils are equal, round, and reactive to light. Conjunctivae, EOM and lids are normal. Right eye exhibits no nystagmus. Left eye exhibits no nystagmus.  Neck: Normal range of motion. Neck supple.  Cardiovascular: Normal rate and regular rhythm.  Pulmonary/Chest: Effort normal and breath sounds normal.  Abdominal: Soft. There is no tenderness.  Musculoskeletal:       Cervical back: She exhibits normal range of motion, no tenderness and no bony tenderness.  Neurological: She is alert and oriented to person, place, and time. She has normal strength and normal reflexes. No cranial nerve deficit or sensory deficit. She displays a negative Romberg sign. Coordination and gait normal. GCS eye subscore is 4. GCS verbal subscore is 5. GCS motor subscore is 6.  Skin: Skin is warm and dry.  Psychiatric: She has a normal mood and affect.  Nursing note and vitals reviewed.    ED Treatments / Results  Labs (all labs ordered are listed, but only abnormal results are displayed) Labs Reviewed  CBG MONITORING, ED    EKG None  Radiology No results found.  Procedures Procedures (including critical care time)  Medications Ordered in ED Medications   ketorolac (TORADOL) 15 MG/ML injection 15 mg (15 mg Intravenous Given 03/24/18 1614)  sodium chloride 0.9 % bolus 500 mL (0 mLs Intravenous Stopped 03/24/18 1651)     Initial Impression / Assessment and Plan / ED Course  I have reviewed the triage vital signs and the nursing notes.  Pertinent labs & imaging results that were available during my care of the patient were reviewed by me and considered in my medical decision making (see chart for details).     Patient seen and examined. Work-up initiated. Medications ordered.  Normal exam.  She request treatment for her headache.  Will give IV Toradol and fluid bolus.  Will try to avoid Reglan given her history of extrapyramidal symptoms with compazine (I saw her a few weeks ago for this).  She states that she has had Reglan in  the past without difficulties however.  Vital signs reviewed and are as follows: BP 115/77 (BP Location: Right Arm)   Pulse 88   Temp 99 F (37.2 C) (Oral)   Resp 18   LMP 03/11/2018   SpO2 100%   5:29 PM Patient feeling better. CBG is normal. She is eating.   Patient urged to return with worsening symptoms or other concerns. Patient verbalized understanding and agrees with plan.    Final Clinical Impressions(s) / ED Diagnoses   Final diagnoses:  Acute nonintractable headache, unspecified headache type   Patient with history of migraines, with similar headache today.  Patient without high-risk features of headache including: sudden onset/thunderclap HA, no similar headache in past, altered mental status, accompanying seizure, headache with exertion, age > 10, history of immunocompromise, neck or shoulder pain, fever, use of anticoagulation, family history of spontaneous SAH, concomitant drug use, toxic exposure.   Patient has a normal complete neurological exam, normal vital signs, normal level of consciousness, no signs of meningismus, is well-appearing/non-toxic appearing, no signs of trauma.   Imaging with  CT/MRI not indicated given history and physical exam findings.   No dangerous or life-threatening conditions suspected or identified by history, physical exam, and by work-up. No indications for hospitalization identified.    ED Discharge Orders    None       Renne Crigler, Cordelia Poche 03/24/18 1731    Loren Racer, MD 03/24/18 [phone]

## 2018-03-24 NOTE — ED Notes (Signed)
Pt states that her pain is better, but she still feels "groggy."  Pt given peanut butter crackers and some water

## 2018-03-24 NOTE — Discharge Instructions (Signed)
Please read and follow all provided instructions.  Your diagnoses today include:  1. Acute nonintractable headache, unspecified headache type     Tests performed today include:  Vital signs. See below for your results today.   Medications:  In the Emergency Department you received:  Toradol - NSAID medication similar to ibuprofen  Take any prescribed medications only as directed.  Additional information:  Follow any educational materials contained in this packet.  You are having a headache. No specific cause was found today for your headache. It may have been a migraine or other cause of headache. Stress, anxiety, fatigue, and depression are common triggers for headaches.   Your headache today does not appear to be life-threatening or require hospitalization, but often the exact cause of headaches is not determined in the emergency department. Therefore, follow-up with your doctor is very important to find out what may have caused your headache and whether or not you need any further diagnostic testing or treatment.   Sometimes headaches can appear benign (not harmful), but then more serious symptoms can develop which should prompt an immediate re-evaluation by your doctor or the emergency department.  BE VERY CAREFUL not to take multiple medicines containing Tylenol (also called acetaminophen). Doing so can lead to an overdose which can damage your liver and cause liver failure and possibly death.   Follow-up instructions: Please follow-up with your primary care provider in the next 3 days for further evaluation of your symptoms.   Return instructions:   Please return to the Emergency Department if you experience worsening symptoms.  Return if the medications do not resolve your headache, if it recurs, or if you have multiple episodes of vomiting or cannot keep down fluids.  Return if you have a change from the usual headache.  RETURN IMMEDIATELY IF you:  Develop a sudden,  severe headache  Develop confusion or become poorly responsive or faint  Develop a fever above 100.6F or problem breathing  Have a change in speech, vision, swallowing, or understanding  Develop new weakness, numbness, tingling, incoordination in your arms or legs  Have a seizure  Please return if you have any other emergent concerns.  Additional Information:  Your vital signs today were: BP 115/77 (BP Location: Right Arm)    Pulse 88    Temp 99 F (37.2 C) (Oral)    Resp 18    LMP 03/11/2018    SpO2 100%  If your blood pressure (BP) was elevated above 135/85 this visit, please have this repeated by your doctor within one month. --------------

## 2018-07-17 ENCOUNTER — Emergency Department (HOSPITAL_BASED_OUTPATIENT_CLINIC_OR_DEPARTMENT_OTHER)
Admission: EM | Admit: 2018-07-17 | Discharge: 2018-07-17 | Disposition: A | Discharge status: Home / Self Care | Payer: BLUE CROSS/BLUE SHIELD | Attending: Emergency Medicine | Admitting: Emergency Medicine

## 2018-07-17 ENCOUNTER — Encounter (HOSPITAL_BASED_OUTPATIENT_CLINIC_OR_DEPARTMENT_OTHER): Payer: Self-pay | Admitting: Emergency Medicine

## 2018-07-17 ENCOUNTER — Other Ambulatory Visit: Payer: Self-pay

## 2018-07-17 ENCOUNTER — Emergency Department (HOSPITAL_BASED_OUTPATIENT_CLINIC_OR_DEPARTMENT_OTHER): Payer: BLUE CROSS/BLUE SHIELD

## 2018-07-17 DIAGNOSIS — Z87891 Personal history of nicotine dependence: Secondary | ICD-10-CM | POA: Insufficient documentation

## 2018-07-17 DIAGNOSIS — M2550 Pain in unspecified joint: Secondary | ICD-10-CM | POA: Insufficient documentation

## 2018-07-17 DIAGNOSIS — Z79899 Other long term (current) drug therapy: Secondary | ICD-10-CM | POA: Insufficient documentation

## 2018-07-17 DIAGNOSIS — R0602 Shortness of breath: Secondary | ICD-10-CM | POA: Diagnosis present

## 2018-07-17 DIAGNOSIS — J189 Pneumonia, unspecified organism: Secondary | ICD-10-CM | POA: Insufficient documentation

## 2018-07-17 LAB — CBC WITH DIFFERENTIAL/PLATELET
Basophils Absolute: 0 10*3/uL (ref 0.0–0.1)
Basophils Relative: 0 %
Eosinophils Absolute: 0.1 10*3/uL (ref 0.0–0.7)
Eosinophils Relative: 4 %
HCT: 30.7 % — ABNORMAL LOW (ref 36.0–46.0)
Hemoglobin: 10.3 g/dL — ABNORMAL LOW (ref 12.0–15.0)
Lymphocytes Relative: 23 %
Lymphs Abs: 0.7 10*3/uL (ref 0.7–4.0)
MCH: 27 pg (ref 26.0–34.0)
MCHC: 33.6 g/dL (ref 30.0–36.0)
MCV: 80.4 fL (ref 78.0–100.0)
Monocytes Absolute: 0.3 10*3/uL (ref 0.1–1.0)
Monocytes Relative: 11 %
Neutro Abs: 1.8 10*3/uL (ref 1.7–7.7)
Neutrophils Relative %: 62 %
Platelets: 240 10*3/uL (ref 150–400)
RBC: 3.82 MIL/uL — ABNORMAL LOW (ref 3.87–5.11)
RDW: 12.2 % (ref 11.5–15.5)
WBC: 2.9 10*3/uL — ABNORMAL LOW (ref 4.0–10.5)

## 2018-07-17 LAB — BASIC METABOLIC PANEL
Anion gap: 5 (ref 5–15)
BUN: 11 mg/dL (ref 6–20)
CO2: 22 mmol/L (ref 22–32)
Calcium: 8.4 mg/dL — ABNORMAL LOW (ref 8.9–10.3)
Chloride: 109 mmol/L (ref 98–111)
Creatinine, Ser: 0.5 mg/dL (ref 0.44–1.00)
GFR calc Af Amer: >60 mL/min (ref >60)
GFR calc non Af Amer: >60 mL/min (ref >60)
Glucose, Bld: 94 mg/dL (ref 70–99)
Potassium: 3.6 mmol/L (ref 3.5–5.1)
Sodium: 136 mmol/L (ref 135–145)

## 2018-07-17 LAB — TROPONIN I: Troponin I: <0.03 ng/mL (ref <0.03)

## 2018-07-17 MED ORDER — AMOXICILLIN 500 MG PO CAPS: 500.0000 mg | ORAL_CAPSULE | Freq: Three times a day (TID) | ORAL | 0 refills | Status: DC

## 2018-07-17 MED ORDER — AMOXICILLIN 500 MG PO CAPS
1000.0000 mg | ORAL_CAPSULE | Freq: Once | ORAL | Status: AC
  Administered 2018-07-17: 1000 mg via ORAL
  Filled 2018-07-17: qty 2

## 2018-07-17 MED ORDER — AZITHROMYCIN 250 MG PO TABS
500.0000 mg | ORAL_TABLET | Freq: Once | ORAL | Status: AC
  Administered 2018-07-17: 500 mg via ORAL
  Filled 2018-07-17: qty 2

## 2018-07-17 MED ORDER — KETOROLAC TROMETHAMINE 15 MG/ML IJ SOLN
15.0000 mg | Freq: Once | INTRAMUSCULAR | Status: AC
Start: 2018-07-17 — End: 2018-07-17
  Administered 2018-07-17: 15 mg via INTRAMUSCULAR
  Filled 2018-07-17: qty 1

## 2018-07-17 MED ORDER — AZITHROMYCIN 250 MG PO TABS: 250.0000 mg | ORAL_TABLET | Freq: Every day | ORAL | 0 refills | Status: DC

## 2018-07-17 NOTE — ED Triage Notes (Signed)
Pt c/o 4/10 generalized joint pain for the past 3 days getting worse today.

## 2018-07-17 NOTE — ED Provider Notes (Signed)
[facility] EMERGENCY DEPARTMENT Provider Note   CSN: [id number] Arrival date & time: 07/17/18  8295     History   Chief Complaint Chief Complaint  Patient presents with  . Joint Pain    HPI Grace Bishop is a 32 y.o. female.  HPI  32 year old female with arthralgia.  She describes pain in upper arms, bilateral shoulders going over to her upper back and neck and chest.  Onset about 3 days ago.  Waxes and wanes but does not completely go away.  She has a past history of lupus and states that it is not unusual for her have joint pain although it typically does not feel like this.  She has not noticed any rash or swelling.  No cough.  She has felt unusually out of breath with more strenuous activities such as walking up steps.  Denies past history of DVT/PE. Denies and trauma/strain. She feels like symptoms are making her anxiety worse.   Past Medical History:  Diagnosis Date  . Anxiety   . Lupus (HCC)    joint pain  . Migraines   . Ovarian cyst   . Stomach problems   . Thyroid disease     Patient Active Problem List   Diagnosis Date Noted  . Acute intractable headache   . Altered mental status   . Encephalopathy 12/24/2016    Past Surgical History:  Procedure Laterality Date  . OVARIAN CYST REMOVAL    . TONSILLECTOMY       OB History   None      Home Medications    Prior to Admission medications   Medication Sig Start Date End Date Taking? Authorizing Provider  acetaminophen-codeine 120-12 MG/5ML solution Take 10 mLs by mouth every 4 (four) hours as needed for moderate pain. 03/03/18   Lawyer, Cristal Deer, PA-C  baclofen (LIORESAL) 10 MG tablet Take 10 mg 3 (three) times daily as needed by mouth for muscle spasms.    [provider]  diphenhydrAMINE (BENADRYL) 25 MG tablet Take 1 tablet (25 mg total) by mouth every 6 (six) hours. 03/05/18   Renne Crigler, PA-C  Guaifenesin 1200 MG TB12 Take 1 tablet (1,200 mg total) by mouth 2 (two) times  daily. 03/03/18   Lawyer, Cristal Deer, PA-C  hydroxychloroquine (PLAQUENIL) 200 MG tablet Take by mouth daily.    [provider]  ibuprofen (ADVIL,MOTRIN) 800 MG tablet Take 1 tablet (800 mg total) by mouth every 8 (eight) hours as needed. 03/03/18   Lawyer, Cristal Deer, PA-C  metoCLOPramide (REGLAN) 10 MG tablet Take 10 mg by mouth 4 (four) times daily.    [provider]  omeprazole (PRILOSEC) 20 MG capsule Take 20 mg daily by mouth.    [provider]  omeprazole (PRILOSEC) 20 MG capsule Take 1 capsule (20 mg total) daily by mouth. 09/24/17   Arby Barrette, MD  ondansetron (ZOFRAN) 4 MG tablet Take 1 tablet (4 mg total) by mouth every 6 (six) hours. 10/17/17   Law, Waylan Boga, PA-C  pantoprazole (PROTONIX) 20 MG tablet Take 1 tablet (20 mg total) by mouth daily. 10/17/17   Law, Waylan Boga, PA-C  sucralfate (CARAFATE) 1 GM/10ML suspension Take 10 mLs (1 g total) by mouth 4 (four) times daily -  with meals and at bedtime. 10/17/17   Emi Holes, PA-C    Family History Family History  Problem Relation Age of Onset  . Thyroid disease Mother   . Drug abuse Father   . Sickle cell trait Sister  Social History Social History   Tobacco Use  . Smoking status: Former Smoker    Years: 2.00  . Smokeless tobacco: Never Used  Substance Use Topics  . Alcohol use: No  . Drug use: No     Allergies   Compazine [prochlorperazine]   Review of Systems Review of Systems  All systems reviewed and negative, other than as noted in HPI.  Physical Exam Updated Vital Signs BP 109/70 (BP Location: Right Arm)   Pulse 72   Temp 98.8 F (37.1 C) (Oral)   Resp 16   Ht 5\' 2"  (1.575 m)   Wt 57.2 kg   LMP 06/19/2018   SpO2 100%   BMI 23.05 kg/m   Physical Exam  Constitutional: She appears well-developed and well-nourished. No distress.  HENT:  Head: Normocephalic and atraumatic.  Eyes: Pupils are equal, round, and reactive to light. Conjunctivae and EOM are  normal. Right eye exhibits no discharge. Left eye exhibits no discharge.  Neck: Neck supple.  No nuchal rigidity  Cardiovascular: Normal rate, regular rhythm and normal heart sounds. Exam reveals no gallop and no friction rub.  No murmur heard. Pulmonary/Chest: Effort normal and breath sounds normal. No respiratory distress.  Abdominal: Soft. She exhibits no distension. There is no tenderness.  Musculoskeletal: She exhibits no edema or tenderness.  UE and thorax normal to inspection. No significant rash/swelling. Lower extremities symmetric as compared to each other. No calf tenderness. Negative Homan's. No palpable cords.   Neurological: She is alert.  Skin: Skin is warm and dry.  Psychiatric: She has a normal mood and affect. Her behavior is normal. Thought content normal.  Nursing note and vitals reviewed.    ED Treatments / Results  Labs (all labs ordered are listed, but only abnormal results are displayed) Labs Reviewed  CBC WITH DIFFERENTIAL/PLATELET - Abnormal; Notable for the following components:      Result Value   WBC 2.9 (*)    RBC 3.82 (*)    Hemoglobin 10.3 (*)    HCT 30.7 (*)    All other components within normal limits  BASIC METABOLIC PANEL - Abnormal; Notable for the following components:   Calcium 8.4 (*)    All other components within normal limits  TROPONIN I    EKG None  Radiology Dg Chest 2 View  Result Date: 07/17/2018 CLINICAL DATA:  Productive cough and chest pain. EXAM: CHEST - 2 VIEW COMPARISON:  Chest x-ray dated March 03, 2018. FINDINGS: The heart size and mediastinal contours are within normal limits. Normal pulmonary vascularity. Subtle increased patchy opacities in the right middle lobe, and to lesser extent the lingula. No pleural effusion or pneumothorax. No acute osseous abnormality. IMPRESSION: Patchy opacities in the right middle lobe and lingula, suspicious for multifocal pneumonia. Electronically Signed   By: Obie Dredge M.D.   On:  07/17/2018 09:19    Procedures Procedures (including critical care time)  Medications Ordered in ED Medications  ketorolac (TORADOL) 15 MG/ML injection 15 mg (has no administration in time range)     Initial Impression / Assessment and Plan / ED Course  I have reviewed the triage vital signs and the nursing notes.  Pertinent labs & imaging results that were available during my care of the patient were reviewed by me and considered in my medical decision making (see chart for details).    I have reviewed the triage vital signs and the nursing notes. Prior records were reviewed for additional information.    Pertinent labs &  imaging results that were available during my care of the patient were reviewed by me and considered in my medical decision making (see chart for details).  31yF with upper arm and chest pain. Mild dyspnea. CXR with possible pneumonia. Will treat as such. Vitals reassuring. Leukopenia, but this appear chronic. She doesn't have increased WOB. Non-toxic. I think she is appropriate for outpt tx.   Final Clinical Impressions(s) / ED Diagnoses   Final diagnoses:  Community acquired pneumonia of right lung, unspecified part of lung    ED Discharge Orders    None       Raeford Razor, MD 07/17/18 [phone]

## 2018-07-19 ENCOUNTER — Emergency Department (HOSPITAL_BASED_OUTPATIENT_CLINIC_OR_DEPARTMENT_OTHER)
Admission: EM | Admit: 2018-07-19 | Discharge: 2018-07-19 | Disposition: A | Discharge status: Home / Self Care | Payer: BLUE CROSS/BLUE SHIELD | Attending: Emergency Medicine | Admitting: Emergency Medicine

## 2018-07-19 ENCOUNTER — Emergency Department (HOSPITAL_BASED_OUTPATIENT_CLINIC_OR_DEPARTMENT_OTHER)
Admission: EM | Admit: 2018-07-19 | Discharge: 2018-07-19 | Discharge status: Against Medical Advice | Payer: BLUE CROSS/BLUE SHIELD | Source: Home / Self Care

## 2018-07-19 ENCOUNTER — Encounter (HOSPITAL_BASED_OUTPATIENT_CLINIC_OR_DEPARTMENT_OTHER): Payer: Self-pay | Admitting: *Deleted

## 2018-07-19 ENCOUNTER — Other Ambulatory Visit: Payer: Self-pay

## 2018-07-19 DIAGNOSIS — Z79899 Other long term (current) drug therapy: Secondary | ICD-10-CM | POA: Diagnosis not present

## 2018-07-19 DIAGNOSIS — Z87891 Personal history of nicotine dependence: Secondary | ICD-10-CM | POA: Insufficient documentation

## 2018-07-19 DIAGNOSIS — G43109 Migraine with aura, not intractable, without status migrainosus: Secondary | ICD-10-CM | POA: Insufficient documentation

## 2018-07-19 DIAGNOSIS — G43909 Migraine, unspecified, not intractable, without status migrainosus: Secondary | ICD-10-CM | POA: Diagnosis present

## 2018-07-19 DIAGNOSIS — E079 Disorder of thyroid, unspecified: Secondary | ICD-10-CM | POA: Insufficient documentation

## 2018-07-19 DIAGNOSIS — F419 Anxiety disorder, unspecified: Secondary | ICD-10-CM

## 2018-07-19 MED ORDER — HYDROXYZINE HCL 25 MG PO TABS
25.0000 mg | ORAL_TABLET | Freq: Once | ORAL | Status: AC
  Administered 2018-07-19: 25 mg via ORAL
  Filled 2018-07-19: qty 1

## 2018-07-19 MED ORDER — KETOROLAC TROMETHAMINE 15 MG/ML IJ SOLN
15.0000 mg | Freq: Once | INTRAMUSCULAR | Status: AC
  Administered 2018-07-19: 15 mg via INTRAVENOUS
  Filled 2018-07-19: qty 1

## 2018-07-19 MED ORDER — DEXAMETHASONE SODIUM PHOSPHATE 10 MG/ML IJ SOLN: 10.0000 mg | Freq: Once | INTRAMUSCULAR | Status: DC

## 2018-07-19 MED ORDER — HYDROXYZINE HCL 25 MG PO TABS: 25.0000 mg | ORAL_TABLET | Freq: Three times a day (TID) | ORAL | 0 refills | Status: DC | PRN

## 2018-07-19 MED ORDER — SODIUM CHLORIDE 0.9 % IV BOLUS
500.0000 mL | Freq: Once | INTRAVENOUS | Status: AC
  Administered 2018-07-19: 500 mL via INTRAVENOUS

## 2018-07-19 MED ORDER — METOCLOPRAMIDE HCL 5 MG/ML IJ SOLN
10.0000 mg | Freq: Once | INTRAMUSCULAR | Status: AC
  Administered 2018-07-19: 10 mg via INTRAVENOUS
  Filled 2018-07-19: qty 2

## 2018-07-19 NOTE — Discharge Instructions (Addendum)
Continue taking home medications as prescribed. You may take hydroxyzine up to 3 times a day as needed for anxiety or clenching. Make sure you are staying well-hydrated with water. Follow-up with your primary care doctor to discuss your symptoms of anxiety. Follow-up with your neurologist for refill of your medications. Return to the emergency room with any new, worsening, or concerning symptoms.

## 2018-07-19 NOTE — ED Notes (Signed)
Pt verbalizes understanding of dc instructions of dc instructions and denies any further need at this time

## 2018-07-19 NOTE — ED Notes (Signed)
Pt refused decadron, PA made aware,

## 2018-07-19 NOTE — ED Notes (Signed)
Pt refusing any more IV fluid, PA aware

## 2018-07-19 NOTE — ED Notes (Signed)
Pt c/o teeth clenching, informed her that the PA is aware and that she has already taken the benadryl orally that is to help with that

## 2018-07-19 NOTE — ED Notes (Signed)
Pt c/o "teeth clenching." PA-C notified, no new orders.

## 2018-07-19 NOTE — ED Triage Notes (Signed)
Pt c/o " migraine" x 10 mins

## 2018-07-19 NOTE — ED Triage Notes (Signed)
Pt c/o " migraine" x 2 hrs, seen 1 hr ago here for same,

## 2018-07-19 NOTE — ED Provider Notes (Signed)
[facility] EMERGENCY DEPARTMENT Provider Note   CSN: [id number] Arrival date & time: 07/19/18  1427     History   Chief Complaint Chief Complaint  Patient presents with  . Migraine    HPI Grace Bishop is a 32 y.o. female resenting for evaluation of migraine.  Patient states she was driving when she developed an aura.  She then developed a left-sided headache, which feels consistent with her typical migraines.  She reports the headache is throbbing, and constant.  She took a dose of Benadryl, but has not taken any of her other home medications including Reglan or baclofen.  She has associated photophobia.  She denies nausea or vomiting.  She denies vision changes, slurred speech, numbness, weakness, or trauma to the head.  She denies fevers, chills, chest pain.  Patient states that she follows with neurology for her migraines.  Is rated 5 out of 10.  Patient states this feels consistent with previous migraines, no difference today.  HPI  Past Medical History:  Diagnosis Date  . Anxiety   . Lupus (HCC)    joint pain  . Migraines   . Ovarian cyst   . Stomach problems   . Thyroid disease     Patient Active Problem List   Diagnosis Date Noted  . Acute intractable headache   . Altered mental status   . Encephalopathy 12/24/2016    Past Surgical History:  Procedure Laterality Date  . OVARIAN CYST REMOVAL    . TONSILLECTOMY       OB History   None      Home Medications    Prior to Admission medications   Medication Sig Start Date End Date Taking? Authorizing Provider  diphenhydrAMINE (BENADRYL) 25 MG tablet Take 1 tablet (25 mg total) by mouth every 6 (six) hours. 03/05/18  Yes Renne Crigler, PA-C  acetaminophen-codeine 120-12 MG/5ML solution Take 10 mLs by mouth every 4 (four) hours as needed for moderate pain. 03/03/18   Lawyer, Cristal Deer, PA-C  amoxicillin (AMOXIL) 500 MG capsule Take 1 capsule (500 mg total) by mouth 3 (three) times daily. 07/17/18    Raeford Razor, MD  azithromycin (ZITHROMAX) 250 MG tablet Take 1 tablet (250 mg total) by mouth daily. Take first 2 tablets together, then 1 every day until finished. 07/17/18   Raeford Razor, MD  baclofen (LIORESAL) 10 MG tablet Take 10 mg 3 (three) times daily as needed by mouth for muscle spasms.    [provider]  Guaifenesin 1200 MG TB12 Take 1 tablet (1,200 mg total) by mouth 2 (two) times daily. 03/03/18   Lawyer, Cristal Deer, PA-C  hydroxychloroquine (PLAQUENIL) 200 MG tablet Take by mouth daily.    [provider]  hydrOXYzine (ATARAX/VISTARIL) 25 MG tablet Take 1 tablet (25 mg total) by mouth every 8 (eight) hours as needed. 07/19/18   Hildred Pharo, PA-C  ibuprofen (ADVIL,MOTRIN) 800 MG tablet Take 1 tablet (800 mg total) by mouth every 8 (eight) hours as needed. 03/03/18   Lawyer, Cristal Deer, PA-C  metoCLOPramide (REGLAN) 10 MG tablet Take 10 mg by mouth 4 (four) times daily.    [provider]  omeprazole (PRILOSEC) 20 MG capsule Take 20 mg daily by mouth.    [provider]  omeprazole (PRILOSEC) 20 MG capsule Take 1 capsule (20 mg total) daily by mouth. 09/24/17   Arby Barrette, MD  ondansetron (ZOFRAN) 4 MG tablet Take 1 tablet (4 mg total) by mouth every 6 (six) hours. 10/17/17   Buel Ream  M, PA-C  pantoprazole (PROTONIX) 20 MG tablet Take 1 tablet (20 mg total) by mouth daily. 10/17/17   Law, Waylan Boga, PA-C  sucralfate (CARAFATE) 1 GM/10ML suspension Take 10 mLs (1 g total) by mouth 4 (four) times daily -  with meals and at bedtime. 10/17/17   Emi Holes, PA-C    Family History Family History  Problem Relation Age of Onset  . Thyroid disease Mother   . Drug abuse Father   . Sickle cell trait Sister     Social History Social History   Tobacco Use  . Smoking status: Former Smoker    Years: 2.00  . Smokeless tobacco: Never Used  Substance Use Topics  . Alcohol use: No  . Drug use: No     Allergies   Compazine  [prochlorperazine]   Review of Systems Review of Systems  Constitutional: Negative for fever.  HENT: Negative for congestion.   Eyes: Positive for photophobia.  Respiratory: Negative for shortness of breath.   Cardiovascular: Negative for chest pain.  Gastrointestinal: Negative for nausea and vomiting.  Musculoskeletal: Negative for neck pain and neck stiffness.  Allergic/Immunologic: Negative for immunocompromised state.  Neurological: Positive for headaches. Negative for speech difficulty.  Psychiatric/Behavioral: Negative for confusion.  All other systems reviewed and are negative.    Physical Exam Updated Vital Signs BP 120/80 (BP Location: Right Arm)   Pulse 88   Temp 97.9 F (36.6 C) (Oral)   Resp 18   Ht 5\' 2"  (1.575 m)   Wt 57 kg   LMP 06/19/2018   SpO2 100%   BMI 22.98 kg/m   Physical Exam  Constitutional: She is oriented to person, place, and time. She appears well-developed and well-nourished. No distress.  Laying comfortably in the bed in no acute distress  HENT:  Head: Normocephalic and atraumatic.  Eyes: Pupils are equal, round, and reactive to light. Conjunctivae and EOM are normal.  EOMI and PERRLA.  No nystagmus.  Neck: Normal range of motion. Neck supple.  Cardiovascular: Normal rate, regular rhythm and intact distal pulses.  Pulmonary/Chest: Effort normal and breath sounds normal. No respiratory distress. She has no wheezes.  Abdominal: Soft. She exhibits no distension. There is no tenderness.  Musculoskeletal: Normal range of motion.  Strength intact x4.  Sensation intact x4.  Patient is ambulatory.  Neurological: She is alert and oriented to person, place, and time. She displays normal reflexes. No cranial nerve deficit or sensory deficit.  No obvious neurologic deficits.  CN intact.  Nose to finger intact.  Grip strength intact.  Fine movement and coordination intact.  Skin: Skin is warm and dry. Capillary refill takes less than 2 seconds.    Psychiatric: She has a normal mood and affect.  Nursing note and vitals reviewed.    ED Treatments / Results  Labs (all labs ordered are listed, but only abnormal results are displayed) Labs Reviewed - No data to display  EKG None  Radiology No results found.  Procedures Procedures (including critical care time)  Medications Ordered in ED Medications  sodium chloride 0.9 % bolus 500 mL (0 mLs Intravenous Stopped 07/19/18 1607)  ketorolac (TORADOL) 15 MG/ML injection 15 mg (15 mg Intravenous Given 07/19/18 1551)  metoCLOPramide (REGLAN) injection 10 mg (10 mg Intravenous Given 07/19/18 1551)  hydrOXYzine (ATARAX/VISTARIL) tablet 25 mg (25 mg Oral Given 07/19/18 1627)     Initial Impression / Assessment and Plan / ED Course  I have reviewed the triage vital signs and the nursing  notes.  Pertinent labs & imaging results that were available during my care of the patient were reviewed by me and considered in my medical decision making (see chart for details).     Presenting for evaluation of migraine.  Physical exam reassuring, no obvious neurologic deficit.  Patient states this is consistent with previous migraines, no differences today.  Had aura prior to migraine.  Per chart review, patient has been treated successfully with Toradol, Decadron, Reglan.  Has had EPS side effects with Compazine.  Will give fluids, Reglan, Decadron, and Toradol and reassess.  Informed by RN, that patient has declined Decadron.  Patient has declined further fluids.  On reassessment, patient reports headache has improved.  She is no longer having pain.  She is concerned that she may have EPS symptoms such as lockjaw, even though there are no symptoms currently.  I discussed with patient that we gave her medications that she has tolerated well in the past, including medication she has at home.  Patient states she understands, but she would like further observation.  Immediately upon my leaving the room,  patient called out stating that she has clenching of her jaw.  On reassessment, no clenching was noted.  She is talking easily without restrictions.  No spasming of the muscles.  Patient then asked me to wait in the room because she feels it is coming on again.  On reevaluation, patient has tensing of the masseter muscles bilaterally.  However, she is still able to talk and open her jaw.  She is putting her fingers between her upper and lower teeth.  Patient states she is feeling very anxious.  Will give dose of hydralazine to cover for possible EPS as well as anxiety symptoms.  On reassessment, patient reports anxiety and clenching has improved.  Patient reports she has a history of anxiety, does not take anything for this.  Will give hydralazine for home use as needed.  Requesting refill of baclofen, which she states she takes for her migraines.  Discussed with patient that this needs to be prescribed by her primary care doctor and/or neurologist.  At this time, patient appears safe for discharge.  Return precautions given.  Patient states she understands and agrees to plan.  Final Clinical Impressions(s) / ED Diagnoses   Final diagnoses:  Migraine with aura and without status migrainosus, not intractable  Anxiety    ED Discharge Orders         Ordered    hydrOXYzine (ATARAX/VISTARIL) 25 MG tablet  Every 8 hours PRN     07/19/18 1644           Sunday Klos, PA-C 07/19/18 1826    Vanetta Mulders, MD 07/26/18 [phone]

## 2018-08-09 ENCOUNTER — Other Ambulatory Visit: Payer: Self-pay

## 2018-08-09 ENCOUNTER — Encounter (HOSPITAL_BASED_OUTPATIENT_CLINIC_OR_DEPARTMENT_OTHER): Payer: Self-pay

## 2018-08-09 ENCOUNTER — Emergency Department (HOSPITAL_BASED_OUTPATIENT_CLINIC_OR_DEPARTMENT_OTHER): Payer: BLUE CROSS/BLUE SHIELD

## 2018-08-09 ENCOUNTER — Emergency Department (HOSPITAL_BASED_OUTPATIENT_CLINIC_OR_DEPARTMENT_OTHER)
Admission: EM | Admit: 2018-08-09 | Discharge: 2018-08-09 | Disposition: A | Discharge status: Home / Self Care | Payer: BLUE CROSS/BLUE SHIELD | Attending: Emergency Medicine | Admitting: Emergency Medicine

## 2018-08-09 DIAGNOSIS — R51 Headache: Secondary | ICD-10-CM | POA: Diagnosis present

## 2018-08-09 DIAGNOSIS — Z79899 Other long term (current) drug therapy: Secondary | ICD-10-CM | POA: Diagnosis not present

## 2018-08-09 DIAGNOSIS — G43009 Migraine without aura, not intractable, without status migrainosus: Secondary | ICD-10-CM | POA: Diagnosis not present

## 2018-08-09 DIAGNOSIS — Z87891 Personal history of nicotine dependence: Secondary | ICD-10-CM | POA: Insufficient documentation

## 2018-08-09 MED ORDER — ONDANSETRON 4 MG PO TBDP
4.0000 mg | ORAL_TABLET | Freq: Once | ORAL | Status: AC
  Administered 2018-08-09: 4 mg via ORAL
  Filled 2018-08-09: qty 1

## 2018-08-09 MED ORDER — KETOROLAC TROMETHAMINE 30 MG/ML IJ SOLN
30.0000 mg | Freq: Once | INTRAMUSCULAR | Status: AC
  Administered 2018-08-09: 30 mg via INTRAMUSCULAR
  Filled 2018-08-09: qty 1

## 2018-08-09 NOTE — ED Triage Notes (Signed)
Pt c/o migraine and SOB for the last three days, no fevers, no distress in triage

## 2018-08-09 NOTE — ED Provider Notes (Signed)
[facility] EMERGENCY DEPARTMENT Provider Note   CSN: [id number] Arrival date & time: 08/09/18  1557     History   Chief Complaint Chief Complaint  Patient presents with  . Headache    HPI Grace Bishop is a 32 y.o. female with past medical history of migraine headaches, lupus, presenting to the ED with complaint of headache x4 days.  She states it is her typical migraine headache, described as throbbing head pain on the top of her head with associated intermittent nausea.  She denies an aura, vision changes, photophobia, vomiting, fever, or any other associated symptoms.  She takes Reglan and Benadryl at home for her headaches though without much improvement.  Last dose was last night.  She states she gets improvement with IM Toradol. Patient also with second complaint of intermittent shortness of breath with exertion.  States she just finished antibiotic course last week for pneumonia and had similar symptoms including productive cough, some shortness of breath and some pleuritic chest pain.  She states her symptoms resolved for a few days though are somewhat returning and wants to be sure her pneumonia is not returning.  Denies fever, difficulty breathing or swallowing.  The history is provided by the patient.    Past Medical History:  Diagnosis Date  . Anxiety   . Lupus (HCC)    joint pain  . Migraines   . Ovarian cyst   . Stomach problems   . Thyroid disease     Patient Active Problem List   Diagnosis Date Noted  . Acute intractable headache   . Altered mental status   . Encephalopathy 12/24/2016    Past Surgical History:  Procedure Laterality Date  . OVARIAN CYST REMOVAL    . TONSILLECTOMY       OB History   None      Home Medications    Prior to Admission medications   Medication Sig Start Date End Date Taking? Authorizing Provider  baclofen (LIORESAL) 10 MG tablet Take 10 mg 3 (three) times daily as needed by mouth for muscle spasms.   Yes  [provider]  diphenhydrAMINE (BENADRYL) 25 MG tablet Take 1 tablet (25 mg total) by mouth every 6 (six) hours. 03/05/18  Yes Renne Crigler, PA-C  metoCLOPramide (REGLAN) 10 MG tablet Take 10 mg by mouth 4 (four) times daily.   Yes [provider]  acetaminophen-codeine 120-12 MG/5ML solution Take 10 mLs by mouth every 4 (four) hours as needed for moderate pain. 03/03/18   Lawyer, Cristal Deer, PA-C  amoxicillin (AMOXIL) 500 MG capsule Take 1 capsule (500 mg total) by mouth 3 (three) times daily. 07/17/18   Raeford Razor, MD  azithromycin (ZITHROMAX) 250 MG tablet Take 1 tablet (250 mg total) by mouth daily. Take first 2 tablets together, then 1 every day until finished. 07/17/18   Raeford Razor, MD  Guaifenesin 1200 MG TB12 Take 1 tablet (1,200 mg total) by mouth 2 (two) times daily. 03/03/18   Lawyer, Cristal Deer, PA-C  hydroxychloroquine (PLAQUENIL) 200 MG tablet Take by mouth daily.    [provider]  hydrOXYzine (ATARAX/VISTARIL) 25 MG tablet Take 1 tablet (25 mg total) by mouth every 8 (eight) hours as needed. 07/19/18   Caccavale, Sophia, PA-C  ibuprofen (ADVIL,MOTRIN) 800 MG tablet Take 1 tablet (800 mg total) by mouth every 8 (eight) hours as needed. 03/03/18   Lawyer, Cristal Deer, PA-C  omeprazole (PRILOSEC) 20 MG capsule Take 20 mg daily by mouth.    [provider]  omeprazole (PRILOSEC) 20 MG capsule Take 1 capsule (20 mg total) daily by mouth. 09/24/17   Arby Barrette, MD  ondansetron (ZOFRAN) 4 MG tablet Take 1 tablet (4 mg total) by mouth every 6 (six) hours. 10/17/17   Law, Waylan Boga, PA-C  pantoprazole (PROTONIX) 20 MG tablet Take 1 tablet (20 mg total) by mouth daily. 10/17/17   Law, Waylan Boga, PA-C  sucralfate (CARAFATE) 1 GM/10ML suspension Take 10 mLs (1 g total) by mouth 4 (four) times daily -  with meals and at bedtime. 10/17/17   Emi Holes, PA-C    Family History Family History  Problem Relation Age of Onset  . Thyroid disease  Mother   . Drug abuse Father   . Sickle cell trait Sister     Social History Social History   Tobacco Use  . Smoking status: Former Smoker    Years: 2.00  . Smokeless tobacco: Never Used  Substance Use Topics  . Alcohol use: No  . Drug use: No     Allergies   Compazine [prochlorperazine]   Review of Systems Review of Systems  Constitutional: Negative for fever.  Eyes: Negative for photophobia and visual disturbance.  Respiratory: Positive for cough and shortness of breath.   Gastrointestinal: Positive for nausea. Negative for abdominal pain and vomiting.  Musculoskeletal: Negative for neck pain and neck stiffness.  Neurological: Positive for headaches.  All other systems reviewed and are negative.    Physical Exam Updated Vital Signs BP 109/75   Pulse 86   Temp 98.6 F (37 C) (Oral)   Resp 20   Ht 5\' 2"  (1.575 m)   Wt 57 kg   LMP 07/19/2018   SpO2 99%   BMI 22.98 kg/m   Physical Exam  Constitutional: She appears well-developed and well-nourished. She does not appear ill. No distress.  HENT:  Head: Normocephalic and atraumatic.  Eyes: Pupils are equal, round, and reactive to light. Conjunctivae and EOM are normal.  Neck: Normal range of motion. Neck supple.  No nuchal rigidity or meningismus  Cardiovascular: Normal rate, regular rhythm and normal heart sounds.  Pulmonary/Chest: Effort normal and breath sounds normal. No stridor. No respiratory distress. She has no wheezes. She has no rales.  Abdominal: Soft. Bowel sounds are normal. She exhibits no distension. There is no tenderness. There is no rebound and no guarding.  Neurological: She is alert.  Mental Status:  Alert, oriented, thought content appropriate, able to give a coherent history. Speech fluent without evidence of aphasia. Able to follow 2 step commands without difficulty.  Cranial Nerves:  II:  Peripheral visual fields grossly normal, pupils equal, round, reactive to light III,IV, VI: ptosis  not present, extra-ocular motions intact bilaterally  V,VII: smile symmetric, facial light touch sensation equal VIII: hearing grossly normal to voice  X: uvula elevates symmetrically  XI: bilateral shoulder shrug symmetric and strong XII: midline tongue extension without fassiculations Motor:  Normal tone. 5/5 in upper and lower extremities bilaterally including strong and equal grip strength and dorsiflexion/plantar flexion Sensory: Pinprick and light touch normal in all extremities.  Deep Tendon Reflexes: 2+ and symmetric in the biceps and patella Cerebellar: normal finger-to-nose with bilateral upper extremities Gait: normal gait and balance CV: distal pulses palpable throughout    Skin: Skin is warm.  Psychiatric: She has a normal mood and affect. Her behavior is normal.  Nursing note and vitals reviewed.    ED Treatments / Results  Labs (all labs ordered are listed, but only  abnormal results are displayed) Labs Reviewed - No data to display  EKG None  Radiology Dg Chest 2 View  Result Date: 08/09/2018 CLINICAL DATA:  Shortness of breath. EXAM: CHEST - 2 VIEW COMPARISON:  Radiographs of July 17, 2018. FINDINGS: The heart size and mediastinal contours are within normal limits. Both lungs are clear. No pneumothorax or pleural effusion is noted. The visualized skeletal structures are unremarkable. IMPRESSION: No active cardiopulmonary disease. Electronically Signed   By: Lupita RaiderJames  Green Jr, M.D.   On: 08/09/2018 17:35    Procedures Procedures (including critical care time)  Medications Ordered in ED Medications  ketorolac (TORADOL) 30 MG/ML injection 30 mg (30 mg Intramuscular Given 08/09/18 1710)  ondansetron (ZOFRAN-ODT) disintegrating tablet 4 mg (4 mg Oral Given 08/09/18 1806)     Initial Impression / Assessment and Plan / ED Course  I have reviewed the triage vital signs and the nursing notes.  Pertinent labs & imaging results that were available during my care of  the patient were reviewed by me and considered in my medical decision making (see chart for details).  Clinical Course as of Aug 09 1809  Tue Aug 09, 2018  1808 Patient reevaluated, reporting improvement in headache and states she is ready for home.   [JR]    Clinical Course User Index [JR] Robinson, [place] N, PA-C    Pt with history of migraine headache, presenting to the ED with typical symptoms.  Normal neurologic exam.  HA treated and improved while in ED. still concern for some intermittent shortness of breath on exertion after recent pneumonia.  On exam lungs are clear, vital signs are normal.  Chest x-ray is negative for persistent pneumonia.   Presentation is like pts typical HA and non concerning for [facility], ICH, Meningitis, or temporal arteritis. Pt is afebrile with no focal neuro deficits, nuchal rigidity, or change in vision. Pt is to follow up with PCP to discuss prophylactic medication. Pt verbalizes understanding and is agreeable with plan to dc.   Discussed results, findings, treatment and follow up. Patient advised of return precautions. Patient verbalized understanding and agreed with plan.  Final Clinical Impressions(s) / ED Diagnoses   Final diagnoses:  Migraine without aura and without status migrainosus, not intractable    ED Discharge Orders    None       Robinson, [place] N, PA-C 08/09/18 1810    Arby BarrettePfeiffer, Marcy, MD 08/10/18 2146

## 2018-08-09 NOTE — Discharge Instructions (Signed)
Please read instructions below. You can take 600 mg of Advil/ibuprofen every 6 hours as needed for headache. You can take your reglan as prescribed. Schedule an appointment with your headache specialist. Return to the ER for severely worsening headache, vision changes, fever, weakness or numbness, or new or concerning symptoms.

## 2018-08-18 ENCOUNTER — Emergency Department (HOSPITAL_BASED_OUTPATIENT_CLINIC_OR_DEPARTMENT_OTHER)
Admission: EM | Admit: 2018-08-18 | Discharge: 2018-08-18 | Disposition: A | Discharge status: Home / Self Care | Payer: BLUE CROSS/BLUE SHIELD

## 2018-08-25 IMAGING — DX DG CHEST 2V
2 series · 2 of 2 positions shown · non-contrast
Comparison: None.

CLINICAL DATA: Cough, 1 day duration.  Bilateral arm and leg pain.

EXAM:
CHEST  2 VIEW

[chest pa]
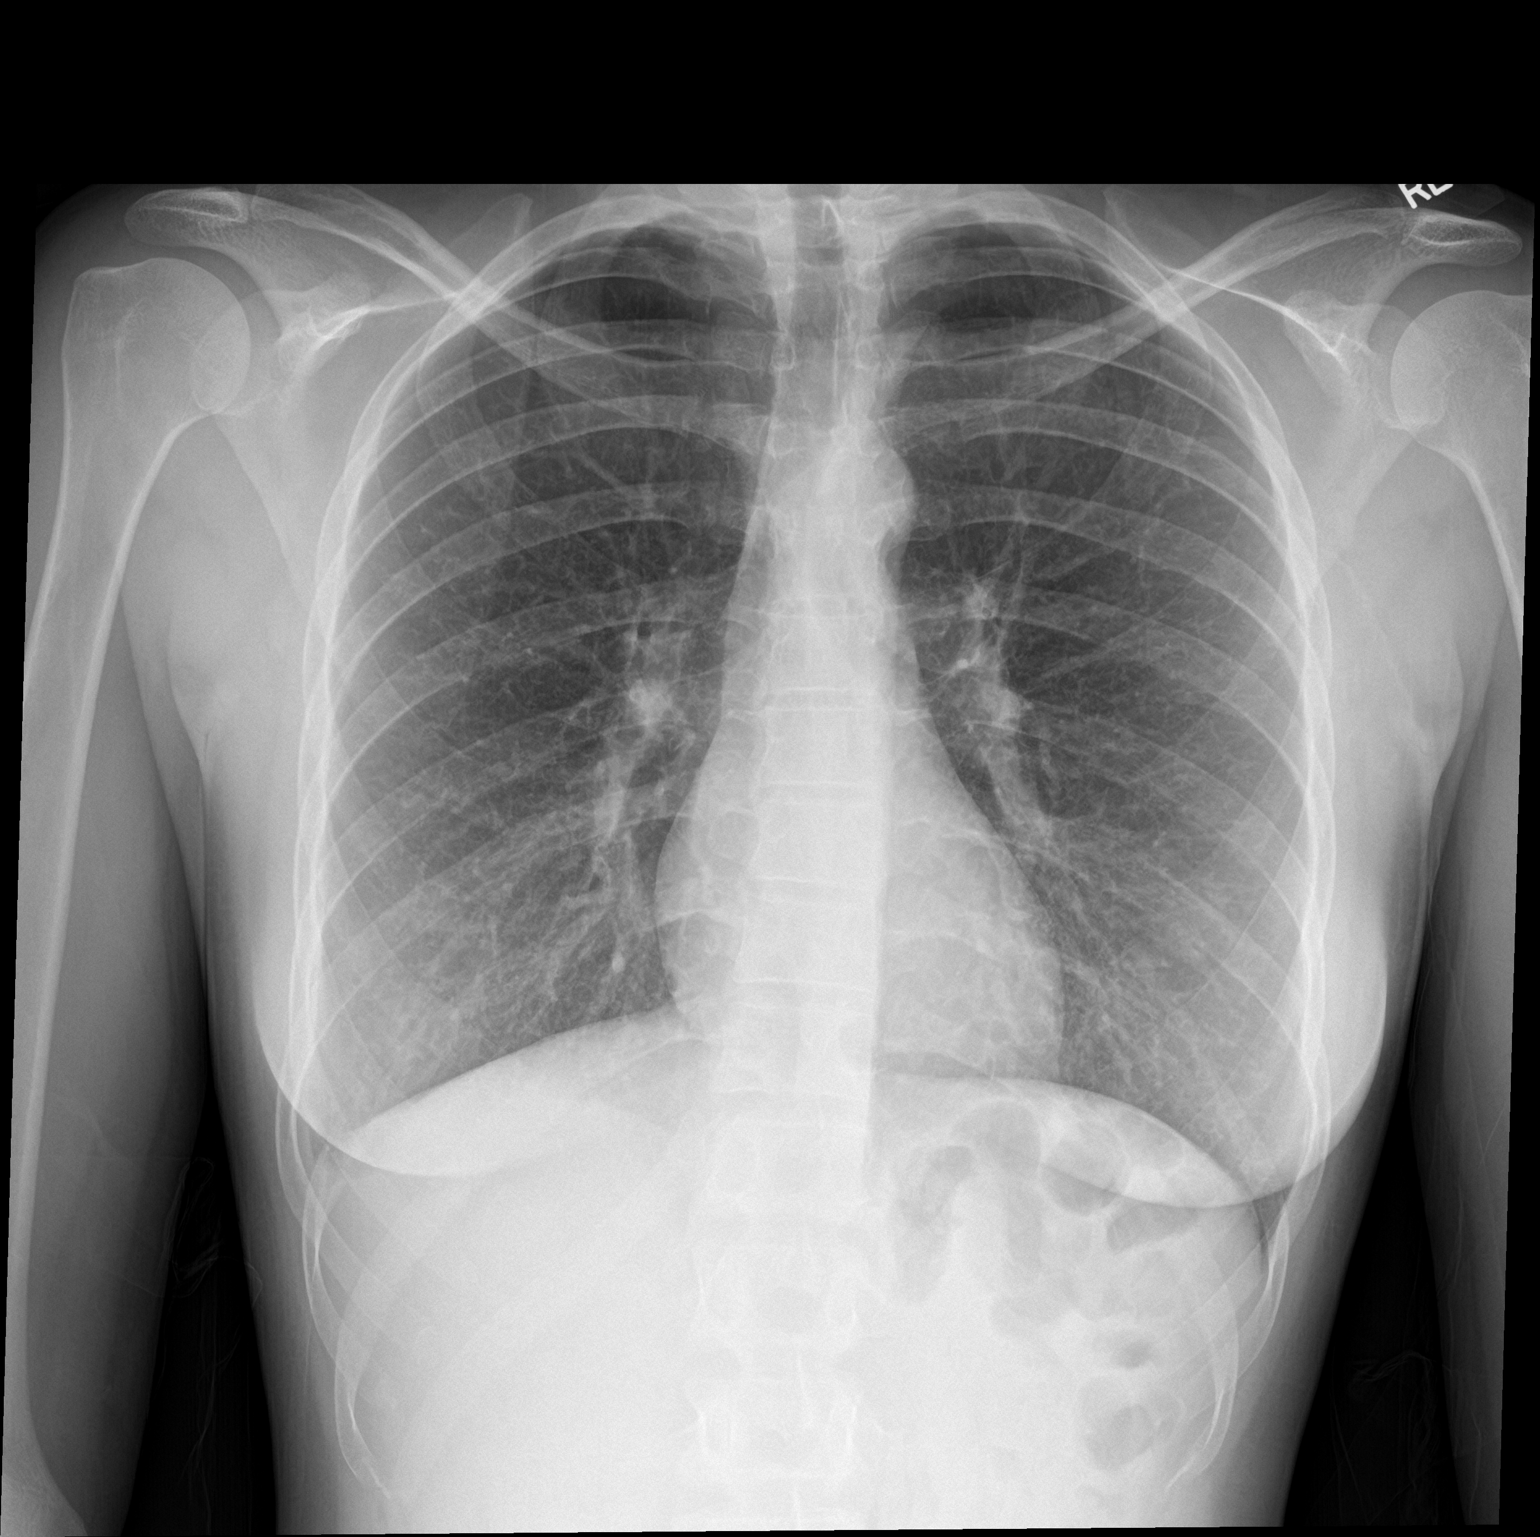

[chest lat]
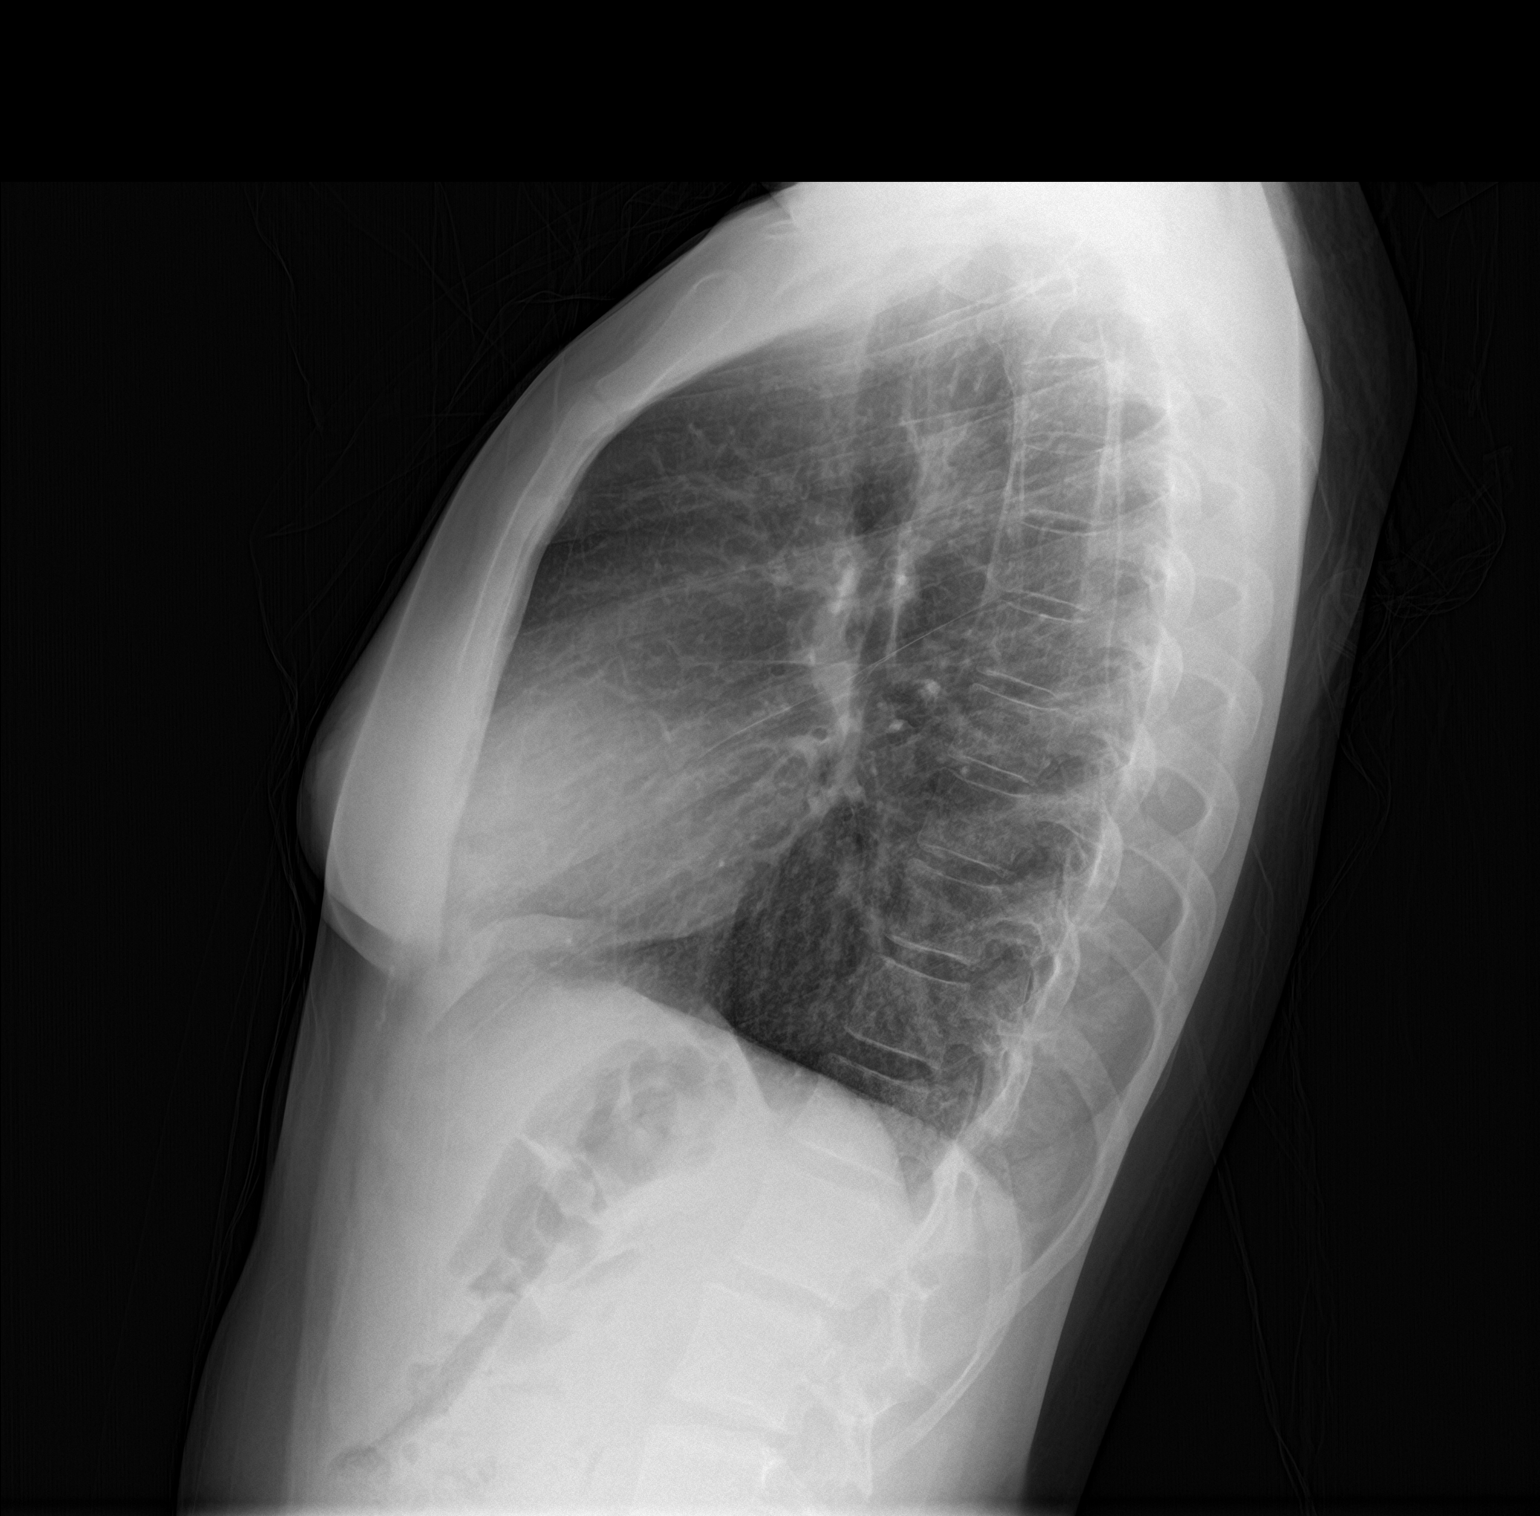

[2 of 2 positions shown; findings below may reference images not displayed]

FINDINGS: Heart size is normal. Mediastinal shadows are normal. The left lung
is clear. There is right middle lobe pneumonia. No effusions. No
bony abnormalities.
IMPRESSION: Right middle lobe pneumonia

## 2018-09-02 ENCOUNTER — Emergency Department (HOSPITAL_BASED_OUTPATIENT_CLINIC_OR_DEPARTMENT_OTHER)
Admission: EM | Admit: 2018-09-02 | Discharge: 2018-09-02 | Disposition: A | Discharge status: Home / Self Care | Payer: BLUE CROSS/BLUE SHIELD | Attending: Emergency Medicine | Admitting: Emergency Medicine

## 2018-09-02 ENCOUNTER — Encounter (HOSPITAL_BASED_OUTPATIENT_CLINIC_OR_DEPARTMENT_OTHER): Payer: Self-pay | Admitting: Emergency Medicine

## 2018-09-02 ENCOUNTER — Other Ambulatory Visit: Payer: Self-pay

## 2018-09-02 DIAGNOSIS — M791 Myalgia, unspecified site: Secondary | ICD-10-CM | POA: Diagnosis present

## 2018-09-02 DIAGNOSIS — Z79899 Other long term (current) drug therapy: Secondary | ICD-10-CM | POA: Insufficient documentation

## 2018-09-02 DIAGNOSIS — M2550 Pain in unspecified joint: Secondary | ICD-10-CM | POA: Diagnosis not present

## 2018-09-02 DIAGNOSIS — Z87891 Personal history of nicotine dependence: Secondary | ICD-10-CM | POA: Diagnosis not present

## 2018-09-02 MED ORDER — HYDROCODONE-ACETAMINOPHEN 5-325 MG PO TABS: 1.0000 | ORAL_TABLET | Freq: Four times a day (QID) | ORAL | 0 refills | Status: DC | PRN

## 2018-09-02 MED ORDER — KETOROLAC TROMETHAMINE 30 MG/ML IJ SOLN
30.0000 mg | Freq: Once | INTRAMUSCULAR | Status: AC
  Administered 2018-09-02: 30 mg via INTRAMUSCULAR
  Filled 2018-09-02: qty 1

## 2018-09-02 NOTE — ED Triage Notes (Signed)
Pt states she is having 6/10 generalized body ache for one week with HA and sore throat she thinks it may be a flare up on Lupus.

## 2018-09-02 NOTE — ED Provider Notes (Addendum)
[facility] Provider Note: Lowella Dell, MD, FACEP  CSN: [id number] MRN: [id number] ARRIVAL: 09/02/18 at 0106 ROOM: [facility]07/[facility]07   CHIEF COMPLAINT  Generalized Body Aches   HISTORY OF PRESENT ILLNESS  09/02/18 3:33 AM Grace Bishop is a 32 y.o. female was recently started on Lamictal for migraines.  Since starting the Lamictal she has developed generalized joint pain which she rates as a 5 out of 10.  She is also had continued headaches but less severe than before she started Lamictal.  She is seeing her neurologist in 3 days for follow-up but is requesting something for pain in the meantime.  She does not like to take steroids.  She is only been treated with Plaquenil for her lupus in the past.   Past Medical History:  Diagnosis Date  . Anxiety   . Lupus (HCC)    joint pain  . Migraines   . Ovarian cyst   . Stomach problems   . Thyroid disease     Past Surgical History:  Procedure Laterality Date  . OVARIAN CYST REMOVAL    . TONSILLECTOMY      Family History  Problem Relation Age of Onset  . Thyroid disease Mother   . Drug abuse Father   . Sickle cell trait Sister     Social History   Tobacco Use  . Smoking status: Former Smoker    Years: 2.00  . Smokeless tobacco: Never Used  Substance Use Topics  . Alcohol use: No  . Drug use: No    Prior to Admission medications   Medication Sig Start Date End Date Taking? Authorizing Provider  acetaminophen-codeine 120-12 MG/5ML solution Take 10 mLs by mouth every 4 (four) hours as needed for moderate pain. 03/03/18   Lawyer, Cristal Deer, PA-C  amoxicillin (AMOXIL) 500 MG capsule Take 1 capsule (500 mg total) by mouth 3 (three) times daily. 07/17/18   Raeford Razor, MD  azithromycin (ZITHROMAX) 250 MG tablet Take 1 tablet (250 mg total) by mouth daily. Take first 2 tablets together, then 1 every day until finished. 07/17/18   Raeford Razor, MD  baclofen (LIORESAL) 10 MG tablet Take 10 mg 3 (three) times daily  as needed by mouth for muscle spasms.    [provider]  diphenhydrAMINE (BENADRYL) 25 MG tablet Take 1 tablet (25 mg total) by mouth every 6 (six) hours. 03/05/18   Renne Crigler, PA-C  Guaifenesin 1200 MG TB12 Take 1 tablet (1,200 mg total) by mouth 2 (two) times daily. 03/03/18   Lawyer, Cristal Deer, PA-C  hydroxychloroquine (PLAQUENIL) 200 MG tablet Take by mouth daily.    [provider]  hydrOXYzine (ATARAX/VISTARIL) 25 MG tablet Take 1 tablet (25 mg total) by mouth every 8 (eight) hours as needed. 07/19/18   Caccavale, Sophia, PA-C  ibuprofen (ADVIL,MOTRIN) 800 MG tablet Take 1 tablet (800 mg total) by mouth every 8 (eight) hours as needed. 03/03/18   Lawyer, Cristal Deer, PA-C  metoCLOPramide (REGLAN) 10 MG tablet Take 10 mg by mouth 4 (four) times daily.    [provider]  omeprazole (PRILOSEC) 20 MG capsule Take 20 mg daily by mouth.    [provider]  omeprazole (PRILOSEC) 20 MG capsule Take 1 capsule (20 mg total) daily by mouth. 09/24/17   Arby Barrette, MD  ondansetron (ZOFRAN) 4 MG tablet Take 1 tablet (4 mg total) by mouth every 6 (six) hours. 10/17/17   Law, Waylan Boga, PA-C  pantoprazole (PROTONIX) 20 MG tablet Take 1 tablet (20 mg  total) by mouth daily. 10/17/17   Law, Waylan Boga, PA-C  sucralfate (CARAFATE) 1 GM/10ML suspension Take 10 mLs (1 g total) by mouth 4 (four) times daily -  with meals and at bedtime. 10/17/17   Emi Holes, PA-C    Allergies Compazine [prochlorperazine]   REVIEW OF SYSTEMS  Negative except as noted here or in the History of Present Illness.   PHYSICAL EXAMINATION  Initial Vital Signs Blood pressure 121/80, pulse 83, temperature 98.8 F (37.1 C), temperature source Oral, resp. rate 16, height 5\' 2"  (1.575 m), weight 57 kg, last menstrual period 08/18/2018, SpO2 96 %.  Examination General: Well-developed, well-nourished female in no acute distress; appearance consistent with age of record HENT:  normocephalic; atraumatic Eyes: pupils equal, round and reactive to light; extraocular muscles intact Neck: supple Heart: regular rate and rhythm Lungs: clear to auscultation bilaterally Abdomen: soft; nondistended; nontender; no masses or hepatosplenomegaly; bowel sounds present Extremities: No deformity; joint pain on movement; pulses normal Neurologic: Awake, alert and oriented; motor function intact in all extremities and symmetric; no facial droop Skin: Warm and dry Psychiatric: Normal mood and affect   RESULTS  Summary of this visit's results, reviewed by myself:   EKG Interpretation  Date/Time:    Ventricular Rate:    PR Interval:    QRS Duration:   QT Interval:    QTC Calculation:   R Axis:     Text Interpretation:        Laboratory Studies: No results found for this or any previous visit (from the past 24 hour(s)). Imaging Studies: No results found.  ED COURSE and MDM  Nursing notes and initial vitals signs, including pulse oximetry, reviewed.  Vitals:   09/02/18 0118 09/02/18 0120  BP: 121/80   Pulse: 83   Resp: 16   Temp: 98.8 F (37.1 C)   TempSrc: Oral   SpO2: 96%   Weight:  57 kg  Height:  5\' 2"  (1.575 m)   Patient is requesting a shot of Toradol.  Her creatinine a month ago was 0.5 so I believe this is safe.  We will avoid longer-term NSAIDs as if this is a lupus flare it could be detrimental to her kidneys.  We will try a short course of hydrocodone pending follow-up with her neurologist.  Consultation with the [facility] state controlled substances database reveals the patient has received 2 prescriptions for opioid pain medication in the last 2 years.   PROCEDURES    ED DIAGNOSES     ICD-10-CM   1. Acute joint pain M25.50        Grace Bishop, Jonny Ruiz, MD 09/02/18 0344    Paula Libra, MD 09/02/18 [phone]

## 2018-09-10 ENCOUNTER — Other Ambulatory Visit: Payer: Self-pay

## 2018-09-10 ENCOUNTER — Encounter (HOSPITAL_BASED_OUTPATIENT_CLINIC_OR_DEPARTMENT_OTHER): Payer: Self-pay | Admitting: Emergency Medicine

## 2018-09-10 ENCOUNTER — Emergency Department (HOSPITAL_BASED_OUTPATIENT_CLINIC_OR_DEPARTMENT_OTHER)
Admission: EM | Admit: 2018-09-10 | Discharge: 2018-09-10 | Disposition: A | Discharge status: Home / Self Care | Payer: BLUE CROSS/BLUE SHIELD | Attending: Emergency Medicine | Admitting: Emergency Medicine

## 2018-09-10 DIAGNOSIS — Z87891 Personal history of nicotine dependence: Secondary | ICD-10-CM | POA: Insufficient documentation

## 2018-09-10 DIAGNOSIS — Z79899 Other long term (current) drug therapy: Secondary | ICD-10-CM | POA: Insufficient documentation

## 2018-09-10 DIAGNOSIS — G43009 Migraine without aura, not intractable, without status migrainosus: Secondary | ICD-10-CM | POA: Diagnosis not present

## 2018-09-10 DIAGNOSIS — R51 Headache: Secondary | ICD-10-CM | POA: Diagnosis present

## 2018-09-10 LAB — BASIC METABOLIC PANEL
ANION GAP: 5 (ref 5–15)
BUN: 8 mg/dL (ref 6–20)
CALCIUM: 9 mg/dL (ref 8.9–10.3)
CHLORIDE: 105 mmol/L (ref 98–111)
CO2: 25 mmol/L (ref 22–32)
CREATININE: 0.59 mg/dL (ref 0.44–1.00)
GFR calc non Af Amer: >60 mL/min (ref >60)
GLUCOSE: 98 mg/dL (ref 70–99)
Potassium: 3.5 mmol/L (ref 3.5–5.1)
Sodium: 135 mmol/L (ref 135–145)

## 2018-09-10 LAB — CBC WITH DIFFERENTIAL/PLATELET
Abs Immature Granulocytes: 0.01 10*3/uL (ref 0.00–0.07)
BASOS ABS: 0 10*3/uL (ref 0.0–0.1)
Basophils Relative: 0 %
EOS ABS: 0.2 10*3/uL (ref 0.0–0.5)
Eosinophils Relative: 8 %
HCT: 33.7 % — ABNORMAL LOW (ref 36.0–46.0)
HEMOGLOBIN: 10.6 g/dL — AB (ref 12.0–15.0)
IMMATURE GRANULOCYTES: 0 %
LYMPHS ABS: 0.6 10*3/uL — AB (ref 0.7–4.0)
LYMPHS PCT: 22 %
MCH: 26 pg (ref 26.0–34.0)
MCHC: 31.5 g/dL (ref 30.0–36.0)
MCV: 82.8 fL (ref 80.0–100.0)
MONOS PCT: 11 %
Monocytes Absolute: 0.3 10*3/uL (ref 0.1–1.0)
NEUTROS PCT: 59 %
NRBC: 0 % (ref 0.0–0.2)
Neutro Abs: 1.6 10*3/uL — ABNORMAL LOW (ref 1.7–7.7)
Platelets: 274 10*3/uL (ref 150–400)
RBC: 4.07 MIL/uL (ref 3.87–5.11)
RDW: 12.6 % (ref 11.5–15.5)
WBC: 2.8 10*3/uL — ABNORMAL LOW (ref 4.0–10.5)

## 2018-09-10 LAB — PREGNANCY, URINE: Preg Test, Ur: NEGATIVE

## 2018-09-10 MED ORDER — ONDANSETRON 4 MG PO TBDP
4.0000 mg | ORAL_TABLET | Freq: Once | ORAL | Status: AC
  Administered 2018-09-10: 4 mg via ORAL
  Filled 2018-09-10: qty 1

## 2018-09-10 MED ORDER — SODIUM CHLORIDE 0.9 % IV SOLN: INTRAVENOUS | Status: DC

## 2018-09-10 MED ORDER — KETOROLAC TROMETHAMINE 15 MG/ML IJ SOLN: 15.0000 mg | Freq: Once | INTRAMUSCULAR | Status: DC

## 2018-09-10 MED ORDER — ONDANSETRON HCL 4 MG/2ML IJ SOLN: 4.0000 mg | Freq: Once | INTRAMUSCULAR | Status: DC

## 2018-09-10 MED ORDER — ONDANSETRON 4 MG PO TBDP: 4.0000 mg | ORAL_TABLET | Freq: Three times a day (TID) | ORAL | 0 refills | Status: DC | PRN

## 2018-09-10 MED ORDER — SODIUM CHLORIDE 0.9 % IV BOLUS: 1000.0000 mL | Freq: Once | INTRAVENOUS | Status: DC

## 2018-09-10 MED ORDER — KETOROLAC TROMETHAMINE 30 MG/ML IJ SOLN
30.0000 mg | Freq: Once | INTRAMUSCULAR | Status: AC
  Administered 2018-09-10: 30 mg via INTRAMUSCULAR
  Filled 2018-09-10: qty 1

## 2018-09-10 MED ORDER — KETOROLAC TROMETHAMINE 10 MG PO TABS: 10.0000 mg | ORAL_TABLET | Freq: Four times a day (QID) | ORAL | 0 refills | Status: DC | PRN

## 2018-09-10 NOTE — ED Notes (Signed)
Pt refused IV. States "I dont want fluids."

## 2018-09-10 NOTE — ED Triage Notes (Signed)
R side headache since yesterday with nausea.

## 2018-09-10 NOTE — ED Provider Notes (Signed)
[facility] EMERGENCY DEPARTMENT Provider Note   CSN: [id number] Arrival date & time: 09/10/18  9562     History   Chief Complaint Chief Complaint  Patient presents with  . Headache    HPI Grace Bishop is a 32 y.o. female.  Pt presents to the ED today with her typical migraine.  The pt said sx started last night.  She has a right sided headache and n/v.  The pt denies f/c.     Past Medical History:  Diagnosis Date  . Anxiety   . Lupus (HCC)    joint pain  . Migraines   . Ovarian cyst   . Stomach problems   . Thyroid disease     Patient Active Problem List   Diagnosis Date Noted  . Acute intractable headache   . Altered mental status   . Encephalopathy 12/24/2016    Past Surgical History:  Procedure Laterality Date  . OVARIAN CYST REMOVAL    . TONSILLECTOMY       OB History   None      Home Medications    Prior to Admission medications   Medication Sig Start Date End Date Taking? Authorizing Provider  baclofen (LIORESAL) 10 MG tablet Take 10 mg 3 (three) times daily as needed by mouth for muscle spasms.    [provider]  diphenhydrAMINE (BENADRYL) 25 MG tablet Take 1 tablet (25 mg total) by mouth every 6 (six) hours. 03/05/18   Renne Crigler, PA-C  HYDROcodone-acetaminophen (NORCO) 5-325 MG tablet Take 1 tablet by mouth every 6 (six) hours as needed (for pain). 09/02/18   Molpus, John, MD  hydroxychloroquine (PLAQUENIL) 200 MG tablet Take by mouth daily.    [provider]  ketorolac (TORADOL) 10 MG tablet Take 1 tablet (10 mg total) by mouth every 6 (six) hours as needed. 09/10/18   Jacalyn Lefevre, MD  metoCLOPramide (REGLAN) 10 MG tablet Take 10 mg by mouth 4 (four) times daily.    [provider]  omeprazole (PRILOSEC) 20 MG capsule Take 1 capsule (20 mg total) daily by mouth. 09/24/17   Arby Barrette, MD  ondansetron (ZOFRAN ODT) 4 MG disintegrating tablet Take 1 tablet (4 mg total) by mouth every 8  (eight) hours as needed. 09/10/18   Jacalyn Lefevre, MD  ondansetron (ZOFRAN) 4 MG tablet Take 1 tablet (4 mg total) by mouth every 6 (six) hours. 10/17/17   Emi Holes, PA-C    Family History Family History  Problem Relation Age of Onset  . Thyroid disease Mother   . Drug abuse Father   . Sickle cell trait Sister     Social History Social History   Tobacco Use  . Smoking status: Former Smoker    Years: 2.00  . Smokeless tobacco: Never Used  Substance Use Topics  . Alcohol use: No  . Drug use: No     Allergies   Compazine [prochlorperazine]   Review of Systems Review of Systems  Gastrointestinal: Positive for nausea and vomiting.  Neurological: Positive for headaches.  All other systems reviewed and are negative.    Physical Exam Updated Vital Signs BP 109/71 (BP Location: Right Arm)   Pulse 88   Temp 98.1 F (36.7 C) (Oral)   Resp 16   Ht 5\' 2"  (1.575 m)   Wt 56.7 kg   LMP 08/18/2018   SpO2 98%   BMI 22.86 kg/m   Physical Exam  Constitutional: She is oriented to person, place, and time. She  appears well-developed and well-nourished.  HENT:  Head: Normocephalic and atraumatic.  Mouth/Throat: Oropharynx is clear and moist.  Eyes: Pupils are equal, round, and reactive to light. EOM are normal.  Neck: Normal range of motion. Neck supple.  Cardiovascular: Normal rate and regular rhythm.  Pulmonary/Chest: Effort normal and breath sounds normal.  Abdominal: Soft. Bowel sounds are normal.  Musculoskeletal: Normal range of motion.  Neurological: She is alert and oriented to person, place, and time. She has normal strength. She displays a negative Romberg sign.  Skin: Skin is warm. Capillary refill takes less than 2 seconds.  Psychiatric: She has a normal mood and affect. Her behavior is normal.  Nursing note and vitals reviewed.    ED Treatments / Results  Labs (all labs ordered are listed, but only abnormal results are displayed) Labs Reviewed    CBC WITH DIFFERENTIAL/PLATELET - Abnormal; Notable for the following components:      Result Value   WBC 2.8 (*)    Hemoglobin 10.6 (*)    HCT 33.7 (*)    Neutro Abs 1.6 (*)    Lymphs Abs 0.6 (*)    All other components within normal limits  PREGNANCY, URINE  BASIC METABOLIC PANEL    EKG None  Radiology No results found.  Procedures Procedures (including critical care time)  Medications Ordered in ED Medications  ketorolac (TORADOL) 30 MG/ML injection 30 mg (30 mg Intramuscular Given 09/10/18 1031)  ondansetron (ZOFRAN-ODT) disintegrating tablet 4 mg (4 mg Oral Given 09/10/18 1031)     Initial Impression / Assessment and Plan / ED Course  I have reviewed the triage vital signs and the nursing notes.  Pertinent labs & imaging results that were available during my care of the patient were reviewed by me and considered in my medical decision making (see chart for details).     Pt did not want IVFs.  She was given toradol IM and zofran odt.  She is feeling much better.  She is instructed to return if worse and to f/u with pcp.  Final Clinical Impressions(s) / ED Diagnoses   Final diagnoses:  Migraine without aura and without status migrainosus, not intractable    ED Discharge Orders         Ordered    ondansetron (ZOFRAN ODT) 4 MG disintegrating tablet  Every 8 hours PRN     09/10/18 1120    ketorolac (TORADOL) 10 MG tablet  Every 6 hours PRN     09/10/18 1120           Jacalyn Lefevre, MD 09/10/18 1122

## 2018-09-11 ENCOUNTER — Other Ambulatory Visit: Payer: Self-pay

## 2018-09-11 ENCOUNTER — Emergency Department (HOSPITAL_BASED_OUTPATIENT_CLINIC_OR_DEPARTMENT_OTHER)
Admission: EM | Admit: 2018-09-11 | Discharge: 2018-09-11 | Disposition: A | Discharge status: Home / Self Care | Payer: BLUE CROSS/BLUE SHIELD | Attending: Emergency Medicine | Admitting: Emergency Medicine

## 2018-09-11 ENCOUNTER — Encounter (HOSPITAL_BASED_OUTPATIENT_CLINIC_OR_DEPARTMENT_OTHER): Payer: Self-pay | Admitting: Emergency Medicine

## 2018-09-11 DIAGNOSIS — Z79899 Other long term (current) drug therapy: Secondary | ICD-10-CM | POA: Insufficient documentation

## 2018-09-11 DIAGNOSIS — Z87891 Personal history of nicotine dependence: Secondary | ICD-10-CM | POA: Insufficient documentation

## 2018-09-11 DIAGNOSIS — F419 Anxiety disorder, unspecified: Secondary | ICD-10-CM | POA: Insufficient documentation

## 2018-09-11 DIAGNOSIS — G43909 Migraine, unspecified, not intractable, without status migrainosus: Secondary | ICD-10-CM | POA: Insufficient documentation

## 2018-09-11 DIAGNOSIS — G43009 Migraine without aura, not intractable, without status migrainosus: Secondary | ICD-10-CM

## 2018-09-11 DIAGNOSIS — R51 Headache: Secondary | ICD-10-CM | POA: Diagnosis present

## 2018-09-11 IMAGING — CT CT HEAD W/O CM
3 series · 14 of 46 positions shown, 16 images · non-contrast
Comparison: None.

CLINICAL DATA: 30 y/o  F; headache.

EXAM:
CT HEAD WITHOUT CONTRAST
TECHNIQUE: Contiguous axial images were obtained from the base of the skull
through the vertex without intravenous contrast.

[Series 2: head wo · axial · 0.40mm/px · z∈[-185,-65]mm · 8 of 29 slices shown, 10 images]
[im 3/29  brain]
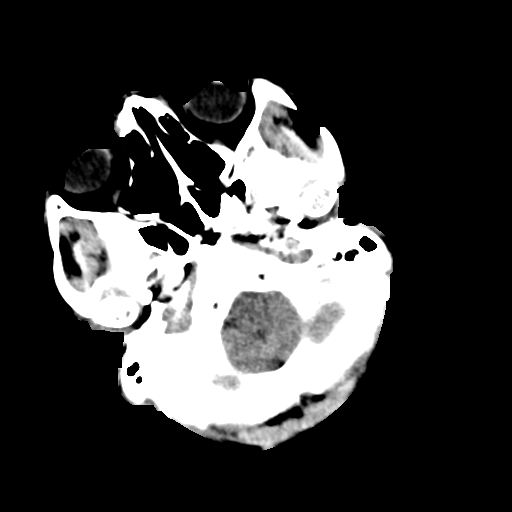
[im 3/29  bone]
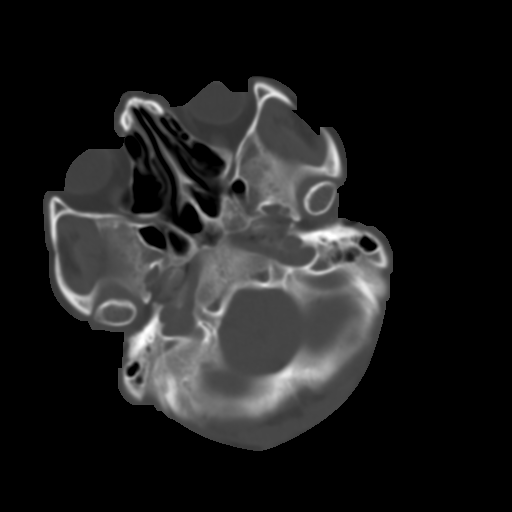
[im 7/29  brain]
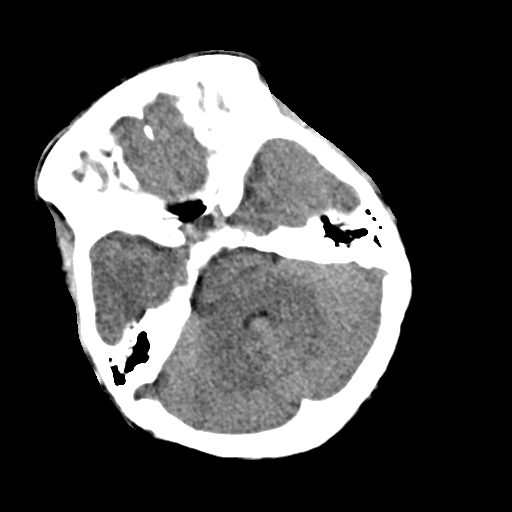
[im 10/29  brain]
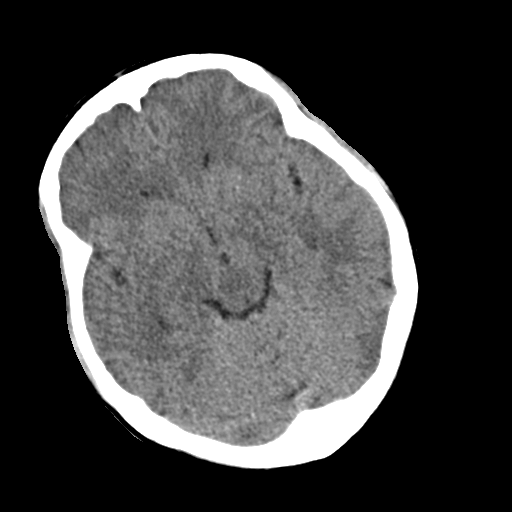
[im 13/29  brain]
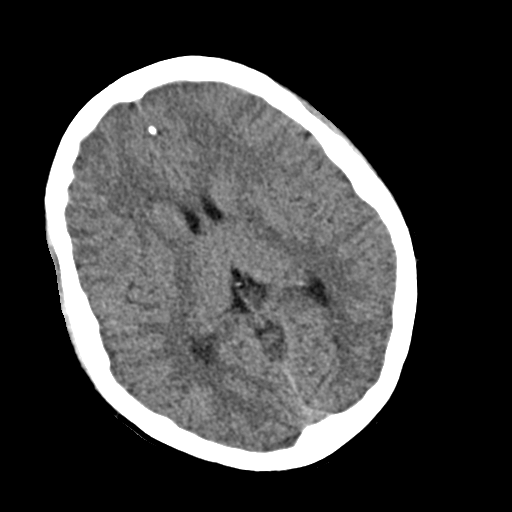
[im 17/29  brain]
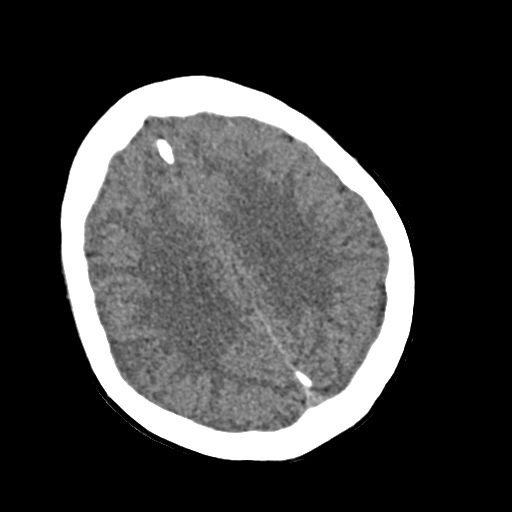
[im 17/29  bone]
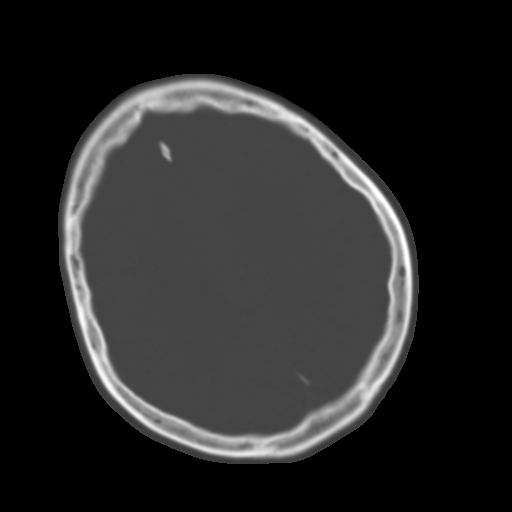
[im 20/29  brain]
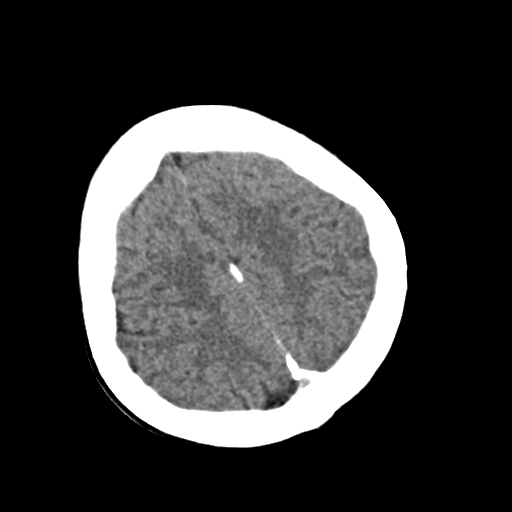
[im 23/29  brain]
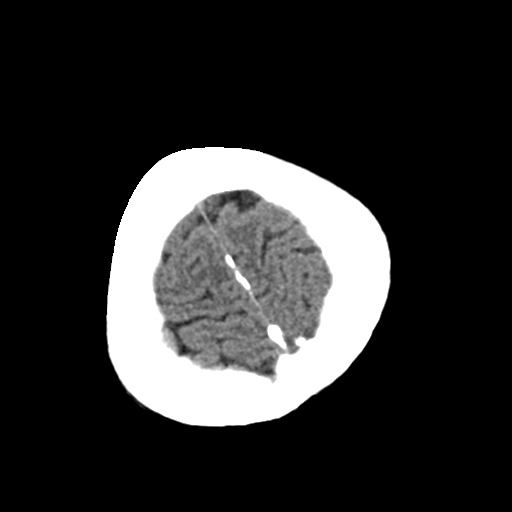
[im 27/29  brain]
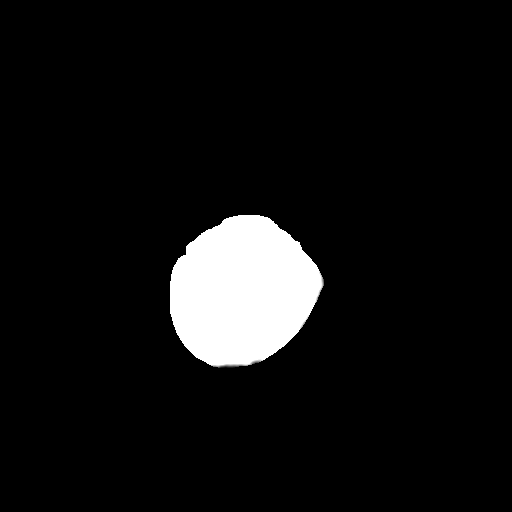

[Series 4: coronal soft · coronal · 0.29mm/px · 3 of 61 slices shown]
[im 21/61  brain]
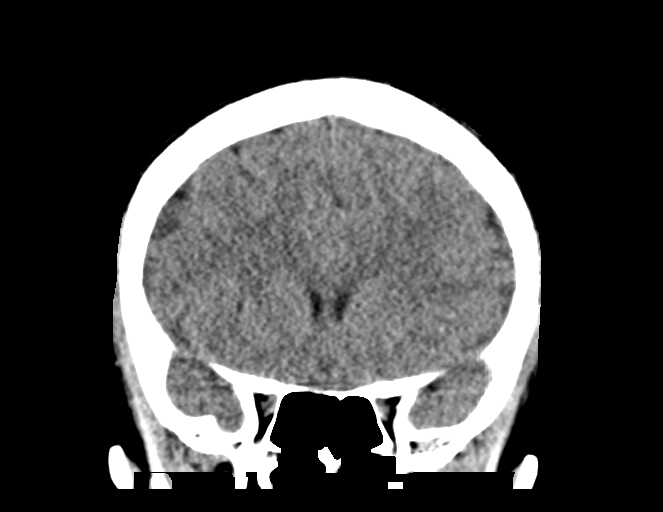
[im 27/61  brain]
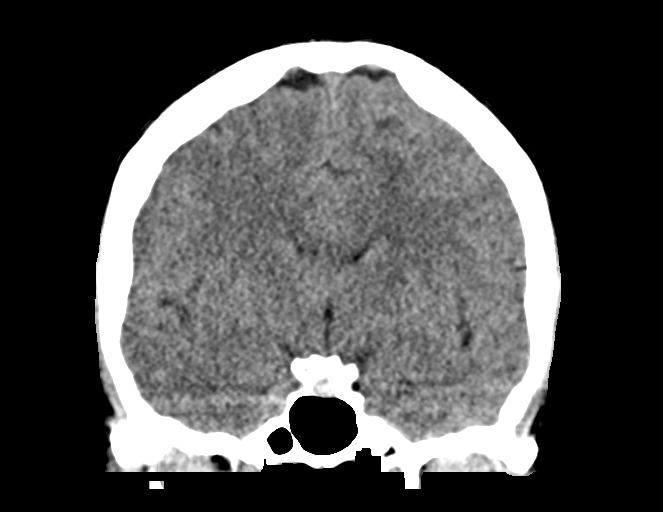
[im 34/61  brain]
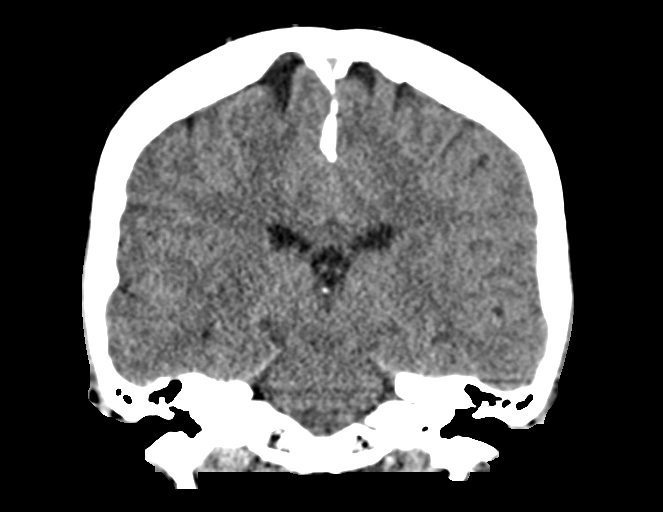

[Series 5: sag soft · sagittal · 0.28mm/px · 3 of 50 slices shown]
[im 17/50  brain]
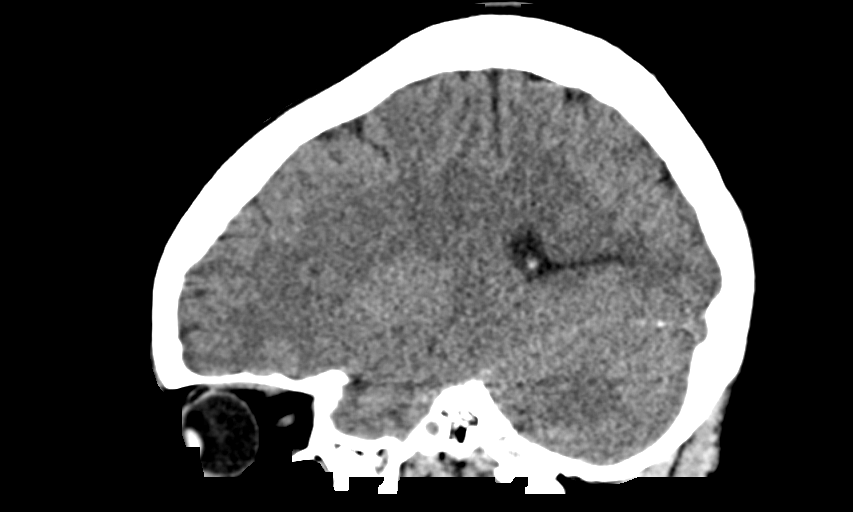
[im 25/50  brain]
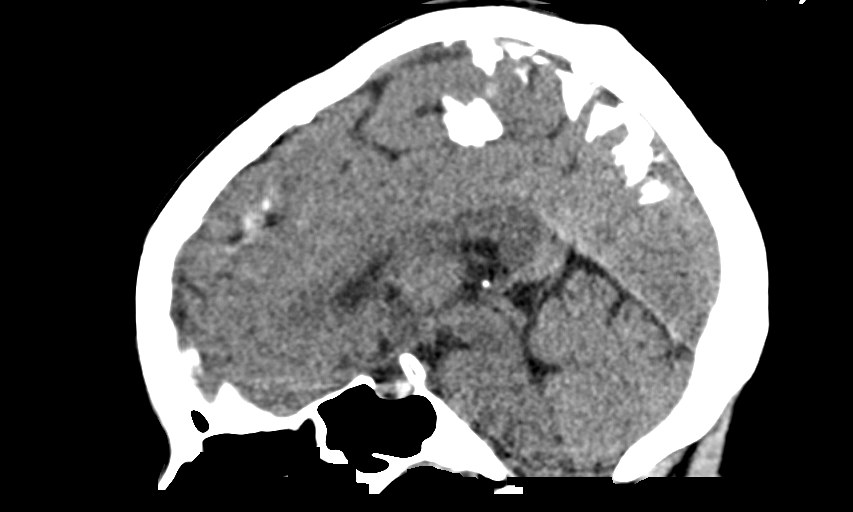
[im 33/50  brain]
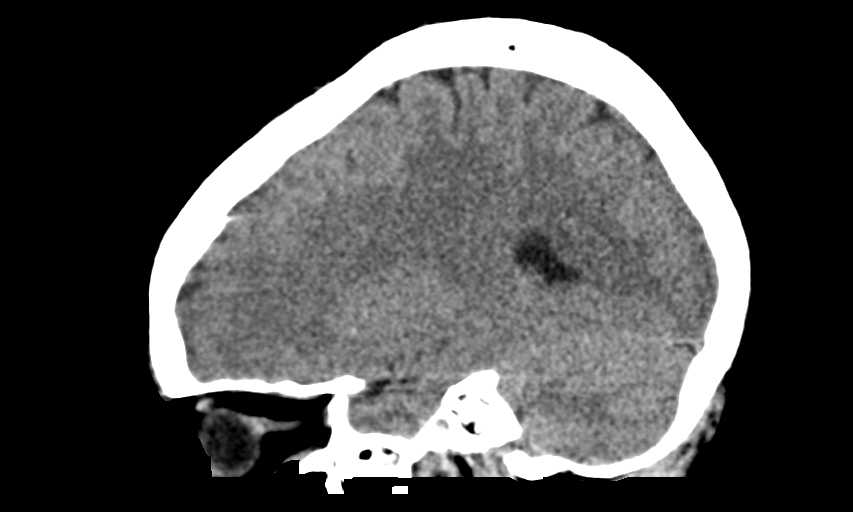

[14 of 46 positions shown; findings below may reference images not displayed]

FINDINGS: Brain: No evidence of acute infarction, hemorrhage, hydrocephalus,
extra-axial collection or mass lesion/mass effect.

Vascular: No hyperdense vessel or unexpected calcification.

Skull: Normal. Negative for fracture or focal lesion.

Sinuses/Orbits: No acute finding.

Other: Numerous punctate calcifications within the parotid glands.
IMPRESSION: 1. No acute intracranial abnormality identified. Unremarkable CT of
the brain for age.
2. Parotid calcifications likely representing sequelae of prior
infectious or inflammatory process.

By: Pirategabe Jernei M.D.

## 2018-09-11 MED ORDER — SUMATRIPTAN SUCCINATE 50 MG PO TABS: 50.0000 mg | ORAL_TABLET | ORAL | 0 refills | Status: DC | PRN

## 2018-09-11 MED ORDER — ONDANSETRON HCL 4 MG/2ML IJ SOLN
4.0000 mg | Freq: Once | INTRAMUSCULAR | Status: AC
  Administered 2018-09-11: 4 mg via INTRAVENOUS
  Filled 2018-09-11: qty 2

## 2018-09-11 MED ORDER — KETOROLAC TROMETHAMINE 30 MG/ML IJ SOLN
30.0000 mg | Freq: Once | INTRAMUSCULAR | Status: AC
  Administered 2018-09-11: 30 mg via INTRAVENOUS
  Filled 2018-09-11: qty 1

## 2018-09-11 MED ORDER — DIPHENHYDRAMINE HCL 50 MG/ML IJ SOLN
25.0000 mg | Freq: Once | INTRAMUSCULAR | Status: AC
  Administered 2018-09-11: 25 mg via INTRAVENOUS
  Filled 2018-09-11: qty 1

## 2018-09-11 MED ORDER — SODIUM CHLORIDE 0.9 % IV BOLUS
1000.0000 mL | Freq: Once | INTRAVENOUS | Status: AC
  Administered 2018-09-11: 1000 mL via INTRAVENOUS

## 2018-09-11 MED ORDER — METOCLOPRAMIDE HCL 5 MG/ML IJ SOLN
10.0000 mg | Freq: Once | INTRAMUSCULAR | Status: DC
  Filled 2018-09-11: qty 2

## 2018-09-11 NOTE — ED Provider Notes (Signed)
[facility] EMERGENCY DEPARTMENT Provider Note  CSN: [id number] Arrival date & time: 09/11/18  1812    History   Chief Complaint Chief Complaint  Patient presents with  . Headache    HPI Grace Bishop is a 32 y.o. female with a medical history of lupus, migraines and thyroid disease who presented to the ED for headaches.  Headache   This is a chronic problem. The current episode started 2 days ago. The headache is associated with an unknown factor. The pain is located in the left unilateral and frontal region. The quality of the pain is described as throbbing and sharp. The pain is severe. The pain does not radiate. Associated symptoms include nausea. Pertinent negatives include no fever, no near-syncope and no vomiting. Associated symptoms comments: Endorses photophobia. Denies paresthesias, vision changes, weakness. Treatments tried: Seen in the ED yesterday and was given IM Toradol. The treatment provided mild relief.   Patient also complains of upper extremity arthralgias which are chronic issue for her as well due to lupus. Denies any recent trauma or injury. Patient currently not on any rheumatologic medicine and has no consistent follow-up with her rheumatologist. Denies fever, rash, joint swelling or urinary complaints.   Additional history obtained by medical chart. Patient was seen in the ED yesterday 09/10/18 for the same complaint. Resolution occurred with IM Toradol and Zofran. Patient is well established with neurology and states that she recently was taken off preventive medication for migraines. She has had 5 ED visits since 07/2018 for headache and/or arthralgias.  Past Medical History:  Diagnosis Date  . Anxiety   . Lupus (HCC)    joint pain  . Migraines   . Ovarian cyst   . Stomach problems   . Thyroid disease     Patient Active Problem List   Diagnosis Date Noted  . Acute intractable headache   . Altered mental status   . Encephalopathy 12/24/2016     Past Surgical History:  Procedure Laterality Date  . OVARIAN CYST REMOVAL    . TONSILLECTOMY       OB History   None      Home Medications    Prior to Admission medications   Medication Sig Start Date End Date Taking? Authorizing Provider  baclofen (LIORESAL) 10 MG tablet Take 10 mg 3 (three) times daily as needed by mouth for muscle spasms.    [provider]  diphenhydrAMINE (BENADRYL) 25 MG tablet Take 1 tablet (25 mg total) by mouth every 6 (six) hours. 03/05/18   Renne Crigler, PA-C  HYDROcodone-acetaminophen (NORCO) 5-325 MG tablet Take 1 tablet by mouth every 6 (six) hours as needed (for pain). 09/02/18   Molpus, John, MD  hydroxychloroquine (PLAQUENIL) 200 MG tablet Take by mouth daily.    [provider]  ketorolac (TORADOL) 10 MG tablet Take 1 tablet (10 mg total) by mouth every 6 (six) hours as needed. 09/10/18   Jacalyn Lefevre, MD  metoCLOPramide (REGLAN) 10 MG tablet Take 10 mg by mouth 4 (four) times daily.    [provider]  omeprazole (PRILOSEC) 20 MG capsule Take 1 capsule (20 mg total) daily by mouth. 09/24/17   Arby Barrette, MD  ondansetron (ZOFRAN ODT) 4 MG disintegrating tablet Take 1 tablet (4 mg total) by mouth every 8 (eight) hours as needed. 09/10/18   Jacalyn Lefevre, MD  ondansetron (ZOFRAN) 4 MG tablet Take 1 tablet (4 mg total) by mouth every 6 (six) hours. 10/17/17   Emi Holes, PA-C  Family History Family History  Problem Relation Age of Onset  . Thyroid disease Mother   . Drug abuse Father   . Sickle cell trait Sister     Social History Social History   Tobacco Use  . Smoking status: Former Smoker    Years: 2.00  . Smokeless tobacco: Never Used  Substance Use Topics  . Alcohol use: No  . Drug use: No     Allergies   Compazine [prochlorperazine]   Review of Systems Review of Systems  Constitutional: Negative for fever.  Cardiovascular: Negative for near-syncope.  Gastrointestinal:  Positive for nausea. Negative for abdominal pain and vomiting.  Genitourinary: Negative.   Musculoskeletal: Positive for arthralgias. Negative for joint swelling and myalgias.  Skin: Negative.   Neurological: Positive for headaches. Negative for dizziness, weakness, light-headedness and numbness.     Physical Exam Updated Vital Signs BP 120/89 (BP Location: Left Arm)   Pulse 90   Temp 98.8 F (37.1 C) (Oral)   Resp 18   Ht 5\' 2"  (1.575 m)   Wt 56.7 kg   LMP 08/18/2018   SpO2 99%   BMI 22.86 kg/m   Physical Exam  Constitutional: She is oriented to person, place, and time. Vital signs are normal. She appears well-developed and well-nourished. She is cooperative.  Sitting in the dark with eyes closed.  HENT:  Head: Normocephalic and atraumatic.  Eyes: Pupils are equal, round, and reactive to light. Conjunctivae, EOM and lids are normal.  Neck: Full passive range of motion without pain. Neck supple. No spinous process tenderness and no muscular tenderness present. Normal range of motion present.  Musculoskeletal: Normal range of motion.  Neurological: She is alert and oriented to person, place, and time. She has normal strength and normal reflexes. No cranial nerve deficit or sensory deficit. She exhibits normal muscle tone. GCS eye subscore is 4. GCS verbal subscore is 5. GCS motor subscore is 6.  Skin: Skin is warm. Capillary refill takes less than 2 seconds. No rash noted.  Nursing note and vitals reviewed.  ED Treatments / Results  Labs (all labs ordered are listed, but only abnormal results are displayed) Labs Reviewed - No data to display  EKG None  Radiology No results found.  Procedures Procedures (including critical care time)  Medications Ordered in ED Medications  sodium chloride 0.9 % bolus 1,000 mL (has no administration in time range)  ketorolac (TORADOL) 30 MG/ML injection 30 mg (has no administration in time range)  metoCLOPramide (REGLAN) injection 10  mg (has no administration in time range)  diphenhydrAMINE (BENADRYL) injection 25 mg (has no administration in time range)   Initial Impression / Assessment and Plan / ED Course  Triage vital signs and the nursing notes have been reviewed.  Pertinent labs & imaging results that were available during care of the patient were reviewed and considered in medical decision making (see chart for details).  Patient presents to the ED with a migraine headache that she states has been present for the last 2 days. She states the quality and severity of her headache is the same as her typical migraines. Denies any neuro symptoms and she does not have any focal neuro deficits on exam. Will attempt acute relief today with IV migraine cocktail and will re-assess patient.   Final Clinical Impressions(s) / ED Diagnoses  1. Migraine Headache. Relief achieved with IV migraine cocktail. Education provided on OTC and supportive treatment for relief. Advised to follow-up with neurologist or PCP regarding re-initiating  preventive medication for migraines.  Dispo: Home. After thorough clinical evaluation, this patient is determined to be medically stable and can be safely discharged with the previously mentioned treatment and/or outpatient follow-up/referral(s). At this time, there are no other apparent medical conditions that require further screening, evaluation or treatment.   Final diagnoses:  Migraine without aura and without status migrainosus, not intractable    ED Discharge Orders         Ordered    SUMAtriptan (IMITREX) 50 MG tablet  Every 2 hours PRN     09/11/18 1910            Mortis, Roanna Raider 09/11/18 1951    Loren Racer, MD 09/11/18 2329

## 2018-09-11 NOTE — ED Triage Notes (Signed)
Pt seen here yesterday for HA. States head and joints hurt worse today.

## 2018-09-11 NOTE — Discharge Instructions (Addendum)
Please follow-up with your neurologist or PCP to discuss your headaches. If they continue to occur more frequently, you may want to discuss going back on a preventive medication. I have prescribed you Imitrex which you can try if Tylenol and Ibuprofen do not work for future headaches.  Also, be sure to call your rheumatologist and schedule a follow-up to discuss the joint pain. You may need to go back on medication for that as well.  Thank you for allowing [place] to take care of you today. Take care of yourself.

## 2018-09-12 IMAGING — CR DG CHEST 2V
2 series · 2 of 2 positions shown · non-contrast
Comparison: 12/08/2016

CLINICAL DATA: Headache.  Recent pneumonia.

EXAM:
CHEST  2 VIEW

[w chest pa]
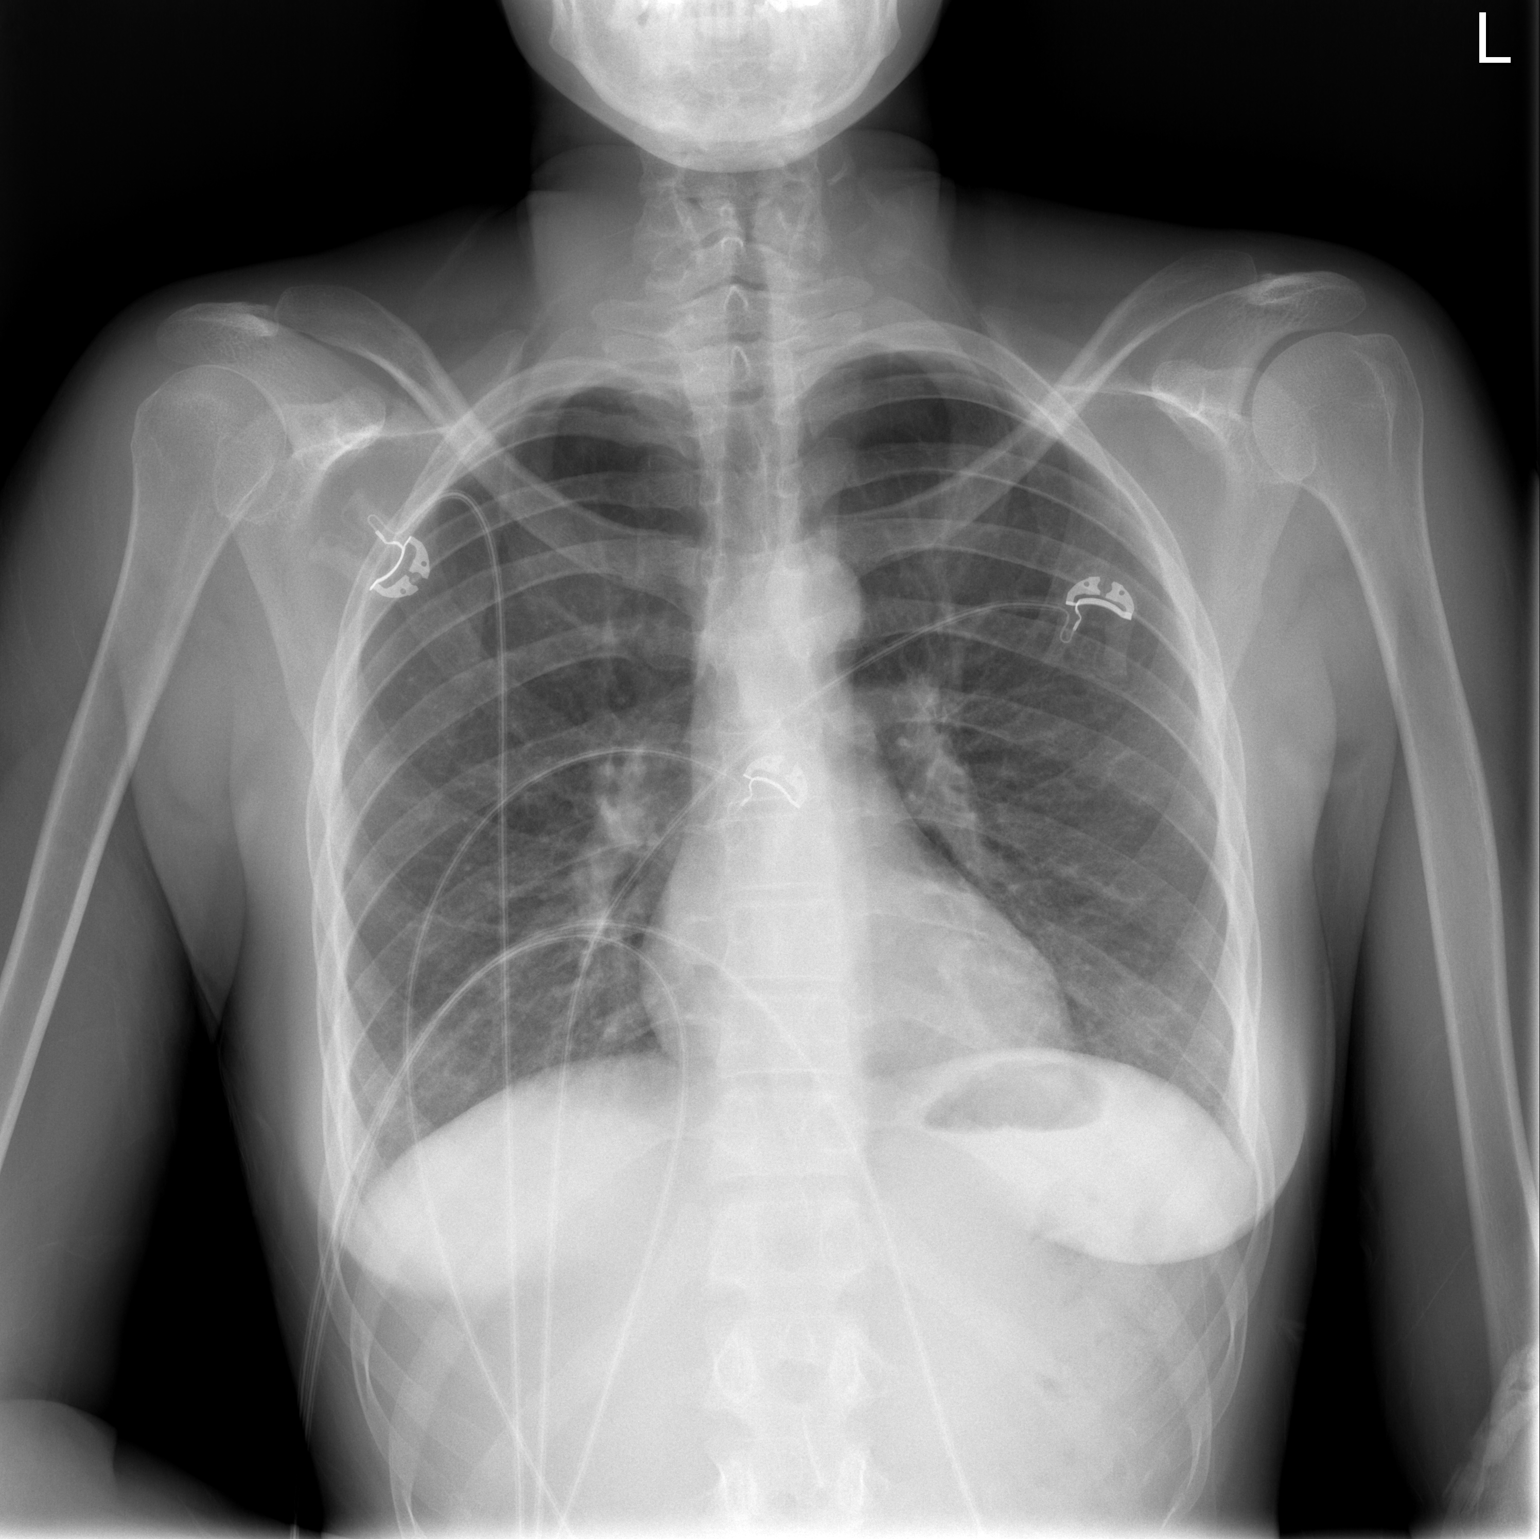

[w chest lat]
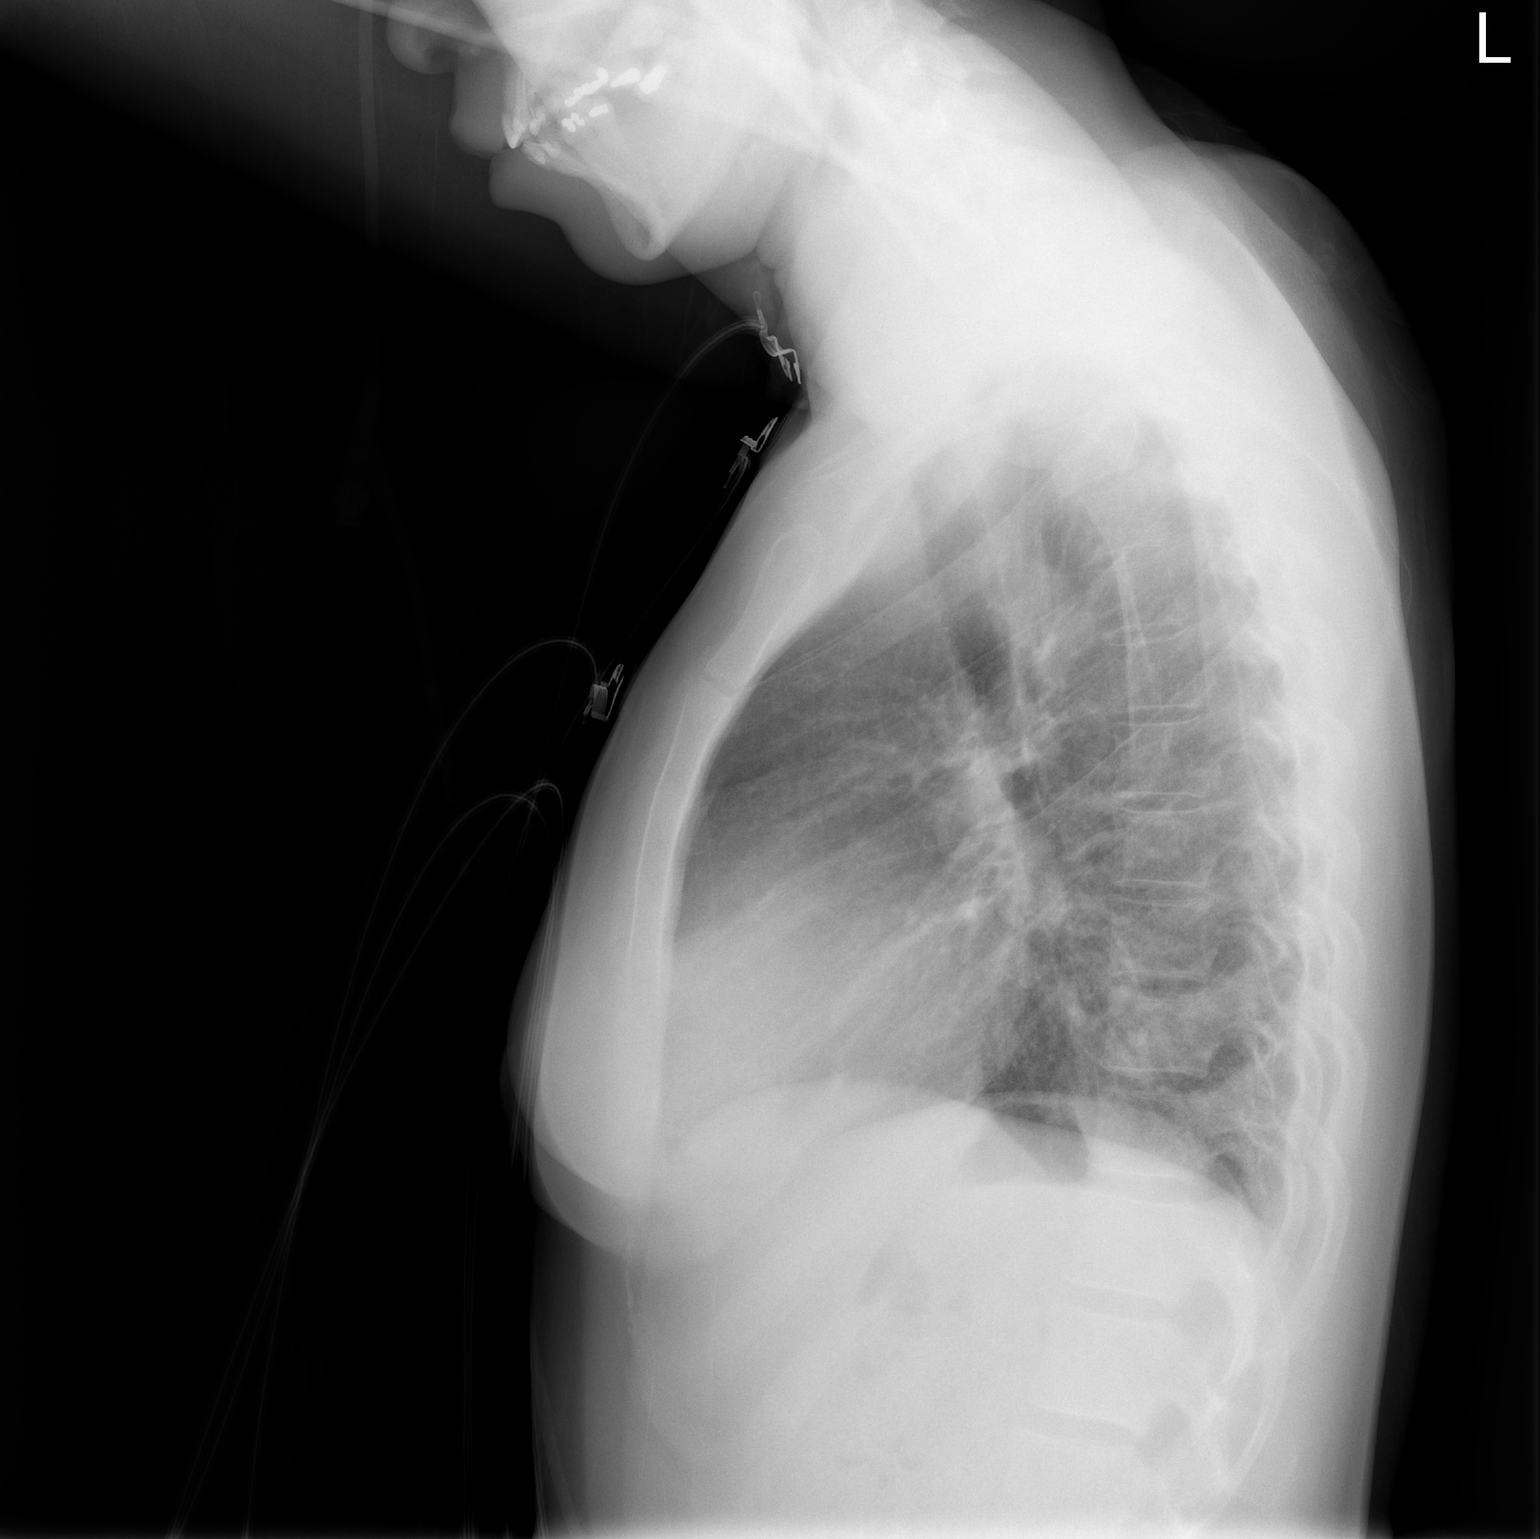

[2 of 2 positions shown; findings below may reference images not displayed]

FINDINGS: No focal consolidation today. There is mild interstitial coarsening
without edema, effusion, or pneumothorax. Normal heart size and
mediastinal contours.
IMPRESSION: No definite acute disease.  No focal pneumonia.

## 2018-09-19 DIAGNOSIS — R911 Solitary pulmonary nodule: Secondary | ICD-10-CM | POA: Insufficient documentation

## 2018-10-21 ENCOUNTER — Encounter (HOSPITAL_BASED_OUTPATIENT_CLINIC_OR_DEPARTMENT_OTHER): Payer: Self-pay

## 2018-10-21 ENCOUNTER — Emergency Department (HOSPITAL_BASED_OUTPATIENT_CLINIC_OR_DEPARTMENT_OTHER)
Admission: EM | Admit: 2018-10-21 | Discharge: 2018-10-21 | Disposition: A | Discharge status: Home / Self Care | Payer: BLUE CROSS/BLUE SHIELD | Attending: Emergency Medicine | Admitting: Emergency Medicine

## 2018-10-21 DIAGNOSIS — Z79899 Other long term (current) drug therapy: Secondary | ICD-10-CM | POA: Diagnosis not present

## 2018-10-21 DIAGNOSIS — B9789 Other viral agents as the cause of diseases classified elsewhere: Secondary | ICD-10-CM | POA: Diagnosis not present

## 2018-10-21 DIAGNOSIS — J069 Acute upper respiratory infection, unspecified: Secondary | ICD-10-CM | POA: Diagnosis not present

## 2018-10-21 DIAGNOSIS — Z87891 Personal history of nicotine dependence: Secondary | ICD-10-CM | POA: Insufficient documentation

## 2018-10-21 DIAGNOSIS — R05 Cough: Secondary | ICD-10-CM | POA: Diagnosis present

## 2018-10-21 LAB — PREGNANCY, URINE: PREG TEST UR: NEGATIVE

## 2018-10-21 LAB — URINALYSIS, ROUTINE W REFLEX MICROSCOPIC
BILIRUBIN URINE: NEGATIVE
Glucose, UA: NEGATIVE mg/dL
Hgb urine dipstick: NEGATIVE
Ketones, ur: 15 mg/dL — AB
LEUKOCYTES UA: NEGATIVE
Nitrite: NEGATIVE
Protein, ur: NEGATIVE mg/dL
SPECIFIC GRAVITY, URINE: 1.015 (ref 1.005–1.030)
pH: 7 (ref 5.0–8.0)

## 2018-10-21 NOTE — Discharge Instructions (Addendum)
Drink plenty of fluids and get plenty of rest.  Continue over-the-counter medications as needed for relief of symptoms.  Return to the emergency department if you develop chest pain, difficulty breathing, or other new and concerning symptoms.

## 2018-10-21 NOTE — ED Triage Notes (Signed)
Pt c/o productive cough of green sputum, headache, fatigue x3 days

## 2018-10-21 NOTE — ED Provider Notes (Signed)
[facility] EMERGENCY DEPARTMENT Provider Note   CSN: [id number] Arrival date & time: 10/21/18  1047     History   Chief Complaint Chief Complaint  Patient presents with  . Cough    HPI Grace Bishop is a 32 y.o. female.  Patient is a 32 year old female with past medical history of lupus, anxiety, migraines.  She presents today for evaluation of URI symptoms.  She reports congestion, cough, runny nose, and not feeling well for the past 3 days.  She is also been nauseated in the mornings and concerned she might be pregnant.  She denies any abdominal pain, vomiting, or diarrhea.  The history is provided by the patient.  Cough  This is a new problem. Episode onset: 3 days ago. The problem occurs constantly. The problem has been gradually worsening. The cough is non-productive. There has been no fever. Pertinent negatives include no chills and no shortness of breath.    Past Medical History:  Diagnosis Date  . Anxiety   . Lupus (HCC)    joint pain  . Migraines   . Ovarian cyst   . Stomach problems   . Thyroid disease     Patient Active Problem List   Diagnosis Date Noted  . Acute intractable headache   . Altered mental status   . Encephalopathy 12/24/2016    Past Surgical History:  Procedure Laterality Date  . OVARIAN CYST REMOVAL    . TONSILLECTOMY       OB History   None      Home Medications    Prior to Admission medications   Medication Sig Start Date End Date Taking? Authorizing Provider  baclofen (LIORESAL) 10 MG tablet Take 10 mg 3 (three) times daily as needed by mouth for muscle spasms.    [provider]  diphenhydrAMINE (BENADRYL) 25 MG tablet Take 1 tablet (25 mg total) by mouth every 6 (six) hours. 03/05/18   Renne CriglerGeiple, Joshua, PA-C  HYDROcodone-acetaminophen (NORCO) 5-325 MG tablet Take 1 tablet by mouth every 6 (six) hours as needed (for pain). 09/02/18   Molpus, John, MD  hydroxychloroquine (PLAQUENIL) 200 MG tablet Take by  mouth daily.    [provider]  ketorolac (TORADOL) 10 MG tablet Take 1 tablet (10 mg total) by mouth every 6 (six) hours as needed. 09/10/18   Jacalyn LefevreHaviland, Julie, MD  metoCLOPramide (REGLAN) 10 MG tablet Take 10 mg by mouth 4 (four) times daily.    [provider]  omeprazole (PRILOSEC) 20 MG capsule Take 1 capsule (20 mg total) daily by mouth. 09/24/17   Arby BarrettePfeiffer, Marcy, MD  ondansetron (ZOFRAN ODT) 4 MG disintegrating tablet Take 1 tablet (4 mg total) by mouth every 8 (eight) hours as needed. 09/10/18   Jacalyn LefevreHaviland, Julie, MD  ondansetron (ZOFRAN) 4 MG tablet Take 1 tablet (4 mg total) by mouth every 6 (six) hours. 10/17/17   Law, Waylan BogaAlexandra M, PA-C  SUMAtriptan (IMITREX) 50 MG tablet Take 1 tablet (50 mg total) by mouth every 2 (two) hours as needed for migraine. May repeat in 2 hours if headache persists or recurs. 09/11/18   Mortis, Sharyon MedicusGabrielle I, PA-C    Family History Family History  Problem Relation Age of Onset  . Thyroid disease Mother   . Drug abuse Father   . Sickle cell trait Sister     Social History Social History   Tobacco Use  . Smoking status: Former Smoker    Years: 2.00  . Smokeless tobacco: Never Used  Substance Use  Topics  . Alcohol use: No  . Drug use: No     Allergies   Compazine [prochlorperazine]   Review of Systems Review of Systems  Constitutional: Negative for chills.  Respiratory: Positive for cough. Negative for shortness of breath.   All other systems reviewed and are negative.    Physical Exam Updated Vital Signs BP 113/71 (BP Location: Left Arm)   Pulse 86   Temp 98.3 F (36.8 C) (Oral)   Resp 16   LMP 10/12/2018   SpO2 97%   Physical Exam  Constitutional: She is oriented to person, place, and time. She appears well-developed and well-nourished. No distress.  HENT:  Head: Normocephalic and atraumatic.  Mouth/Throat: Oropharynx is clear and moist. No oropharyngeal exudate.  TMs are clear bilaterally.  Neck: Normal  range of motion. Neck supple.  Cardiovascular: Normal rate and regular rhythm. Exam reveals no gallop and no friction rub.  No murmur heard. Pulmonary/Chest: Effort normal and breath sounds normal. No respiratory distress. She has no wheezes.  Abdominal: Soft. Bowel sounds are normal. She exhibits no distension. There is no tenderness.  Musculoskeletal: Normal range of motion.  Neurological: She is alert and oriented to person, place, and time.  Skin: Skin is warm and dry. She is not diaphoretic.  Nursing note and vitals reviewed.    ED Treatments / Results  Labs (all labs ordered are listed, but only abnormal results are displayed) Labs Reviewed  URINALYSIS, ROUTINE W REFLEX MICROSCOPIC - Abnormal; Notable for the following components:      Result Value   Ketones, ur 15 (*)    All other components within normal limits  PREGNANCY, URINE    EKG None  Radiology No results found.  Procedures Procedures (including critical care time)  Medications Ordered in ED Medications - No data to display   Initial Impression / Assessment and Plan / ED Course  I have reviewed the triage vital signs and the nursing notes.  Pertinent labs & imaging results that were available during my care of the patient were reviewed by me and considered in my medical decision making (see chart for details).  Patient's physical examination is unremarkable, vitals are stable, and there is no hypoxia.  Her lungs are clear.  I suspect a viral etiology.  A urinalysis and pregnancy test were done at her request and are both negative.  I see no indication for any further work-up.  I will advise plenty of fluids, over-the-counter medications, and follow-up as needed.  Final Clinical Impressions(s) / ED Diagnoses   Final diagnoses:  None    ED Discharge Orders    None       Geoffery Lyons, MD 10/21/18 1140

## 2018-10-21 NOTE — ED Triage Notes (Signed)
States needs a preg test

## 2018-12-01 ENCOUNTER — Other Ambulatory Visit: Payer: Self-pay

## 2018-12-01 ENCOUNTER — Emergency Department (HOSPITAL_BASED_OUTPATIENT_CLINIC_OR_DEPARTMENT_OTHER)
Admission: EM | Admit: 2018-12-01 | Discharge: 2018-12-01 | Disposition: A | Discharge status: Home / Self Care | Payer: BLUE CROSS/BLUE SHIELD | Attending: Emergency Medicine | Admitting: Emergency Medicine

## 2018-12-01 ENCOUNTER — Encounter (HOSPITAL_BASED_OUTPATIENT_CLINIC_OR_DEPARTMENT_OTHER): Payer: Self-pay

## 2018-12-01 DIAGNOSIS — R109 Unspecified abdominal pain: Secondary | ICD-10-CM | POA: Diagnosis present

## 2018-12-01 DIAGNOSIS — R1013 Epigastric pain: Secondary | ICD-10-CM | POA: Insufficient documentation

## 2018-12-01 DIAGNOSIS — Z87891 Personal history of nicotine dependence: Secondary | ICD-10-CM | POA: Diagnosis not present

## 2018-12-01 DIAGNOSIS — Z3202 Encounter for pregnancy test, result negative: Secondary | ICD-10-CM | POA: Diagnosis not present

## 2018-12-01 DIAGNOSIS — Z79899 Other long term (current) drug therapy: Secondary | ICD-10-CM | POA: Diagnosis not present

## 2018-12-01 LAB — COMPREHENSIVE METABOLIC PANEL
ALBUMIN: 3 g/dL — AB (ref 3.5–5.0)
ALT: 11 U/L (ref 0–44)
ANION GAP: 4 — AB (ref 5–15)
AST: 25 U/L (ref 15–41)
Alkaline Phosphatase: 46 U/L (ref 38–126)
BUN: 6 mg/dL (ref 6–20)
CALCIUM: 8.7 mg/dL — AB (ref 8.9–10.3)
CHLORIDE: 107 mmol/L (ref 98–111)
CO2: 24 mmol/L (ref 22–32)
CREATININE: 0.44 mg/dL (ref 0.44–1.00)
GFR calc Af Amer: >60 mL/min (ref >60)
GFR calc non Af Amer: >60 mL/min (ref >60)
GLUCOSE: 102 mg/dL — AB (ref 70–99)
Potassium: 3.8 mmol/L (ref 3.5–5.1)
SODIUM: 135 mmol/L (ref 135–145)
Total Bilirubin: 0.3 mg/dL (ref 0.3–1.2)
Total Protein: 8.5 g/dL — ABNORMAL HIGH (ref 6.5–8.1)

## 2018-12-01 LAB — CBC WITH DIFFERENTIAL/PLATELET
ABS IMMATURE GRANULOCYTES: 0.01 10*3/uL (ref 0.00–0.07)
BASOS ABS: 0 10*3/uL (ref 0.0–0.1)
Basophils Relative: 0 %
Eosinophils Absolute: 0.2 10*3/uL (ref 0.0–0.5)
Eosinophils Relative: 5 %
HCT: 31.9 % — ABNORMAL LOW (ref 36.0–46.0)
Hemoglobin: 9.7 g/dL — ABNORMAL LOW (ref 12.0–15.0)
Immature Granulocytes: 0 %
LYMPHS PCT: 42 %
Lymphs Abs: 1.5 10*3/uL (ref 0.7–4.0)
MCH: 24.5 pg — ABNORMAL LOW (ref 26.0–34.0)
MCHC: 30.4 g/dL (ref 30.0–36.0)
MCV: 80.6 fL (ref 80.0–100.0)
Monocytes Absolute: 0.2 10*3/uL (ref 0.1–1.0)
Monocytes Relative: 4 %
NEUTROS ABS: 1.7 10*3/uL (ref 1.7–7.7)
NEUTROS PCT: 49 %
NRBC: 0 % (ref 0.0–0.2)
Platelets: 210 10*3/uL (ref 150–400)
RBC: 3.96 MIL/uL (ref 3.87–5.11)
RDW: 13.3 % (ref 11.5–15.5)
WBC: 3.6 10*3/uL — AB (ref 4.0–10.5)

## 2018-12-01 LAB — LIPASE, BLOOD: Lipase: 39 U/L (ref 11–51)

## 2018-12-01 LAB — URINALYSIS, ROUTINE W REFLEX MICROSCOPIC
Bilirubin Urine: NEGATIVE
GLUCOSE, UA: 100 mg/dL — AB
HGB URINE DIPSTICK: NEGATIVE
KETONES UR: NEGATIVE mg/dL
Leukocytes, UA: NEGATIVE
Nitrite: NEGATIVE
Protein, ur: NEGATIVE mg/dL
Specific Gravity, Urine: 1.01 (ref 1.005–1.030)
pH: 8.5 — ABNORMAL HIGH (ref 5.0–8.0)

## 2018-12-01 LAB — PREGNANCY, URINE: Preg Test, Ur: NEGATIVE

## 2018-12-01 MED ORDER — HYDROCODONE-ACETAMINOPHEN 5-325 MG PO TABS: 1.0000 | ORAL_TABLET | ORAL | 0 refills | Status: DC | PRN

## 2018-12-01 MED ORDER — ONDANSETRON HCL 4 MG/2ML IJ SOLN
4.0000 mg | Freq: Once | INTRAMUSCULAR | Status: AC
  Administered 2018-12-01: 4 mg via INTRAVENOUS
  Filled 2018-12-01: qty 2

## 2018-12-01 MED ORDER — ONDANSETRON 8 MG PO TBDP: 8.0000 mg | ORAL_TABLET | Freq: Three times a day (TID) | ORAL | 0 refills | Status: DC | PRN

## 2018-12-01 MED ORDER — FENTANYL CITRATE (PF) 100 MCG/2ML IJ SOLN
50.0000 ug | Freq: Once | INTRAMUSCULAR | Status: AC
  Administered 2018-12-01: 50 ug via INTRAVENOUS
  Filled 2018-12-01: qty 2

## 2018-12-01 NOTE — ED Triage Notes (Signed)
Right side flank pain that radiates to abdomen with nausea that started today, denies urinary symptoms, drove self here

## 2018-12-01 NOTE — ED Provider Notes (Signed)
[facility] Provider Note: Grace Bishop Ascencion Coye, MD, FACEP  CSN: [id number] MRN: [id number] ARRIVAL: 12/01/18 at 0046 ROOM: [facility]   CHIEF COMPLAINT  Flank Pain   HISTORY OF PRESENT ILLNESS  12/01/18 12:57 AM Grace Bishop is a 33 y.o. female who is here with right flank pain that began yesterday morning.  She describes it as burning and rates it as a 5 out of 10.  It radiates to her epigastrium.  It does not change with movement or palpation.  She has associated nausea but no vomiting.  She has also had diarrhea.  About 5 PM she took hydrocodone, Benadryl and Reglan for a migraine with relief of the migraine but not relief of the flank pain.  She denies urinary symptoms.   Past Medical History:  Diagnosis Date  . Anxiety   . Lupus (HCC)    joint pain  . Migraines   . Ovarian cyst   . Stomach problems   . Thyroid disease     Past Surgical History:  Procedure Laterality Date  . OVARIAN CYST REMOVAL    . TONSILLECTOMY      Family History  Problem Relation Age of Onset  . Thyroid disease Mother   . Drug abuse Father   . Sickle cell trait Sister     Social History   Tobacco Use  . Smoking status: Former Smoker    Years: 2.00  . Smokeless tobacco: Never Used  Substance Use Topics  . Alcohol use: No  . Drug use: No    Prior to Admission medications   Medication Sig Start Date End Date Taking? Authorizing Provider  baclofen (LIORESAL) 10 MG tablet Take 10 mg 3 (three) times daily as needed by mouth for muscle spasms.    [provider]  diphenhydrAMINE (BENADRYL) 25 MG tablet Take 1 tablet (25 mg total) by mouth every 6 (six) hours. 03/05/18   Renne CriglerGeiple, Joshua, PA-C  HYDROcodone-acetaminophen (NORCO) 5-325 MG tablet Take 1 tablet by mouth every 6 (six) hours as needed (for pain). 09/02/18   Siya Flurry, MD  hydroxychloroquine (PLAQUENIL) 200 MG tablet Take by mouth daily.    [provider]  ketorolac (TORADOL) 10 MG tablet Take 1 tablet  (10 mg total) by mouth every 6 (six) hours as needed. 09/10/18   Jacalyn LefevreHaviland, Julie, MD  metoCLOPramide (REGLAN) 10 MG tablet Take 10 mg by mouth 4 (four) times daily.    [provider]  omeprazole (PRILOSEC) 20 MG capsule Take 1 capsule (20 mg total) daily by mouth. 09/24/17   Arby BarrettePfeiffer, Marcy, MD  ondansetron (ZOFRAN ODT) 4 MG disintegrating tablet Take 1 tablet (4 mg total) by mouth every 8 (eight) hours as needed. 09/10/18   Jacalyn LefevreHaviland, Julie, MD  ondansetron (ZOFRAN) 4 MG tablet Take 1 tablet (4 mg total) by mouth every 6 (six) hours. 10/17/17   Law, Waylan BogaAlexandra M, PA-C  SUMAtriptan (IMITREX) 50 MG tablet Take 1 tablet (50 mg total) by mouth every 2 (two) hours as needed for migraine. May repeat in 2 hours if headache persists or recurs. 09/11/18   Mortis, Jerrel IvoryGabrielle I, PA-C    Allergies Compazine [prochlorperazine]   REVIEW OF SYSTEMS  Negative except as noted here or in the History of Present Illness.   PHYSICAL EXAMINATION  Initial Vital Signs Blood pressure 103/69, pulse 81, temperature 98.2 F (36.8 C), temperature source Oral, resp. rate 16, height 5\' 2"  (1.575 m), weight 55.3 kg, last menstrual period 11/08/2018, SpO2 99 %.  Examination General: Well-developed,  well-nourished female in no acute distress; appearance consistent with age of record HENT: normocephalic; atraumatic Eyes: pupils equal, round and reactive to light; extraocular muscles intact Neck: supple Heart: regular rate and rhythm Lungs: clear to auscultation bilaterally Abdomen: soft; nondistended; nontender; no masses or hepatosplenomegaly; bowel sounds present GU: No CVA tenderness Extremities: No deformity; full range of motion; pulses normal Neurologic: Awake, alert and oriented; motor function intact in all extremities and symmetric; no facial droop Skin: Warm and dry Psychiatric: Normal mood and affect   RESULTS  Summary of this visit's results, reviewed by myself:   EKG  Interpretation  Date/Time:    Ventricular Rate:    PR Interval:    QRS Duration:   QT Interval:    QTC Calculation:   R Axis:     Text Interpretation:        Laboratory Studies: Results for orders placed or performed during the hospital encounter of 12/01/18 (from the past 24 hour(s))  Urinalysis, Routine w reflex microscopic     Status: Abnormal   Collection Time: 12/01/18 12:56 AM  Result Value Ref Range   Color, Urine STRAW (A) YELLOW   APPearance HAZY (A) CLEAR   Specific Gravity, Urine 1.010 1.005 - 1.030   pH 8.5 (H) 5.0 - 8.0   Glucose, UA 100 (A) NEGATIVE mg/dL   Hgb urine dipstick NEGATIVE NEGATIVE   Bilirubin Urine NEGATIVE NEGATIVE   Ketones, ur NEGATIVE NEGATIVE mg/dL   Protein, ur NEGATIVE NEGATIVE mg/dL   Nitrite NEGATIVE NEGATIVE   Leukocytes, UA NEGATIVE NEGATIVE  Pregnancy, urine     Status: None   Collection Time: 12/01/18 12:56 AM  Result Value Ref Range   Preg Test, Ur NEGATIVE NEGATIVE  CBC with Differential/Platelet     Status: Abnormal   Collection Time: 12/01/18  1:39 AM  Result Value Ref Range   WBC 3.6 (L) 4.0 - 10.5 K/uL   RBC 3.96 3.87 - 5.11 MIL/uL   Hemoglobin 9.7 (L) 12.0 - 15.0 g/dL   HCT 16.131.9 (L) 09.636.0 - 04.546.0 %   MCV 80.6 80.0 - 100.0 fL   MCH 24.5 (L) 26.0 - 34.0 pg   MCHC 30.4 30.0 - 36.0 g/dL   RDW 40.913.3 81.111.5 - 91.415.5 %   Platelets 210 150 - 400 K/uL   nRBC 0.0 0.0 - 0.2 %   Neutrophils Relative % 49 %   Neutro Abs 1.7 1.7 - 7.7 K/uL   Lymphocytes Relative 42 %   Lymphs Abs 1.5 0.7 - 4.0 K/uL   Monocytes Relative 4 %   Monocytes Absolute 0.2 0.1 - 1.0 K/uL   Eosinophils Relative 5 %   Eosinophils Absolute 0.2 0.0 - 0.5 K/uL   Basophils Relative 0 %   Basophils Absolute 0.0 0.0 - 0.1 K/uL   Immature Granulocytes 0 %   Abs Immature Granulocytes 0.01 0.00 - 0.07 K/uL  Comprehensive metabolic panel     Status: Abnormal   Collection Time: 12/01/18  1:55 AM  Result Value Ref Range   Sodium 135 135 - 145 mmol/L   Potassium 3.8 3.5  - 5.1 mmol/L   Chloride 107 98 - 111 mmol/L   CO2 24 22 - 32 mmol/L   Glucose, Bld 102 (H) 70 - 99 mg/dL   BUN 6 6 - 20 mg/dL   Creatinine, Ser 7.820.44 0.44 - 1.00 mg/dL   Calcium 8.7 (L) 8.9 - 10.3 mg/dL   Total Protein 8.5 (H) 6.5 - 8.1 g/dL   Albumin 3.0 (L)  3.5 - 5.0 g/dL   AST 25 15 - 41 U/L   ALT 11 0 - 44 U/L   Alkaline Phosphatase 46 38 - 126 U/L   Total Bilirubin 0.3 0.3 - 1.2 mg/dL   GFR calc non Af Amer >60 >60 mL/min   GFR calc Af Amer >60 >60 mL/min   Anion gap 4 (L) 5 - 15  Lipase, blood     Status: None   Collection Time: 12/01/18  1:55 AM  Result Value Ref Range   Lipase 39 11 - 51 U/L   Imaging Studies: No results found.  ED COURSE and MDM  Nursing notes and initial vitals signs, including pulse oximetry, reviewed.  Vitals:   12/01/18 0052 12/01/18 0053  BP: 103/69   Pulse: 81   Resp: 16   Temp: 98.2 F (36.8 C)   TempSrc: Oral   SpO2: 99%   Weight:  55.3 kg  Height:  5\' 2"  (1.575 m)   2:30 AM Patient advised of reassuring lab results.  She states she is chronically anemic.  Abdomen remains soft and nontender.  I do not believe a CT scan or other imaging is indicated at this time.  We will treat her symptomatically and have her return if symptoms worsen.   PROCEDURES    ED DIAGNOSES     ICD-10-CM   1. Right flank pain R10.9   2. Epigastric abdominal pain R10.13        Renley Gutman, Jonny Ruiz, MD 12/01/18 [phone]

## 2018-12-16 ENCOUNTER — Emergency Department (HOSPITAL_BASED_OUTPATIENT_CLINIC_OR_DEPARTMENT_OTHER): Payer: BLUE CROSS/BLUE SHIELD

## 2018-12-16 ENCOUNTER — Encounter (HOSPITAL_BASED_OUTPATIENT_CLINIC_OR_DEPARTMENT_OTHER): Payer: Self-pay

## 2018-12-16 ENCOUNTER — Other Ambulatory Visit: Payer: Self-pay

## 2018-12-16 ENCOUNTER — Emergency Department (HOSPITAL_BASED_OUTPATIENT_CLINIC_OR_DEPARTMENT_OTHER)
Admission: EM | Admit: 2018-12-16 | Discharge: 2018-12-16 | Disposition: A | Discharge status: Home / Self Care | Payer: BLUE CROSS/BLUE SHIELD | Attending: Emergency Medicine | Admitting: Emergency Medicine

## 2018-12-16 DIAGNOSIS — J189 Pneumonia, unspecified organism: Secondary | ICD-10-CM

## 2018-12-16 DIAGNOSIS — Z87891 Personal history of nicotine dependence: Secondary | ICD-10-CM | POA: Insufficient documentation

## 2018-12-16 DIAGNOSIS — G43009 Migraine without aura, not intractable, without status migrainosus: Secondary | ICD-10-CM | POA: Diagnosis not present

## 2018-12-16 DIAGNOSIS — M791 Myalgia, unspecified site: Secondary | ICD-10-CM | POA: Diagnosis present

## 2018-12-16 LAB — PREGNANCY, URINE: Preg Test, Ur: NEGATIVE

## 2018-12-16 MED ORDER — KETOROLAC TROMETHAMINE 30 MG/ML IJ SOLN
30.0000 mg | Freq: Once | INTRAMUSCULAR | Status: AC
  Administered 2018-12-16: 30 mg via INTRAMUSCULAR
  Filled 2018-12-16: qty 1

## 2018-12-16 MED ORDER — ONDANSETRON HCL 8 MG PO TABS
4.0000 mg | ORAL_TABLET | Freq: Once | ORAL | Status: AC
  Administered 2018-12-16: 4 mg via ORAL
  Filled 2018-12-16: qty 1

## 2018-12-16 MED ORDER — DIPHENHYDRAMINE HCL 25 MG PO CAPS
25.0000 mg | ORAL_CAPSULE | Freq: Once | ORAL | Status: AC
  Administered 2018-12-16: 25 mg via ORAL
  Filled 2018-12-16: qty 1

## 2018-12-16 MED ORDER — DOXYCYCLINE HYCLATE 100 MG PO CAPS: 100.0000 mg | ORAL_CAPSULE | Freq: Two times a day (BID) | ORAL | 0 refills | Status: AC

## 2018-12-16 NOTE — ED Triage Notes (Signed)
C/o flu like sx x 1 week-seen by PCP yesterday-does not know dx-states she was started on abx-pt c/o migraine x 2 hours-NAD-steady gait

## 2018-12-16 NOTE — ED Provider Notes (Signed)
Emergency Department Provider Note   I have reviewed the triage vital signs and the nursing notes.   HISTORY  Chief Complaint Cough and Migraine   HPI Grace Bishop is a 33 y.o. female with PMH of migraine HA presents to the emergency department for evaluation of flulike symptoms over the past 1 to 2 weeks.  She initially had cough and has since developed body aches and fatigue.  He saw her PCP yesterday who started Augmentin presumably for sinus infection.  Patient began taking this but is not feeling much better.  Earlier this evening she developed a migraine type headache that is right-sided, throbbing, and typical of her migraines.  She denies any sudden onset, maximal intensity headache symptoms.  No weakness or numbness.  No vertigo symptoms.  No bloody sputum.   Past Medical History:  Diagnosis Date  . Anxiety   . Lupus (HCC)    joint pain  . Migraines   . Ovarian cyst   . Stomach problems   . Thyroid disease     Patient Active Problem List   Diagnosis Date Noted  . Acute intractable headache   . Altered mental status   . Encephalopathy 12/24/2016    Past Surgical History:  Procedure Laterality Date  . OVARIAN CYST REMOVAL    . TONSILLECTOMY     Allergies Compazine [prochlorperazine]  Family History  Problem Relation Age of Onset  . Thyroid disease Mother   . Drug abuse Father   . Sickle cell trait Sister     Social History Social History   Tobacco Use  . Smoking status: Former Smoker    Years: 2.00  . Smokeless tobacco: Never Used  Substance Use Topics  . Alcohol use: No  . Drug use: No    Review of Systems  Constitutional: No fever/chills Eyes: No visual changes. ENT: No sore throat. Cardiovascular: Denies chest pain. Respiratory: Denies shortness of breath. Positive cough and congestion.  Gastrointestinal: No abdominal pain.  No nausea, no vomiting.  No diarrhea.  No constipation. Genitourinary: Negative for dysuria. Musculoskeletal:  Negative for back pain. Skin: Negative for rash. Neurological: Negative for focal weakness or numbness. Positive HA.   10-point ROS otherwise negative.  ____________________________________________   PHYSICAL EXAM:  VITAL SIGNS: ED Triage Vitals  Enc Vitals Group     BP 12/16/18 1648 102/81     Pulse Rate 12/16/18 1648 93     Resp 12/16/18 1648 20     Temp 12/16/18 1648 98.5 F (36.9 C)     Temp Source 12/16/18 1648 Oral     SpO2 12/16/18 1648 100 %     Weight 12/16/18 1648 121 lb (54.9 kg)     Height 12/16/18 1648 5\' 2"  (1.575 m)     Pain Score 12/16/18 1647 4   Constitutional: Alert and oriented. Well appearing and in no acute distress. Eyes: Conjunctivae are normal. Head: Atraumatic. Nose: No congestion/rhinnorhea. Mouth/Throat: Mucous membranes are moist.  Neck: No stridor. Cardiovascular: Normal rate, regular rhythm. Good peripheral circulation. Grossly normal heart sounds.   Respiratory: Normal respiratory effort.  No retractions. Lungs CTAB. Gastrointestinal: Soft and nontender. No distention.  Musculoskeletal: No lower extremity tenderness nor edema. No gross deformities of extremities. Neurologic:  Normal speech and language. No gross focal neurologic deficits are appreciated.  Skin:  Skin is warm, dry and intact. No rash noted.  ____________________________________________   LABS (all labs ordered are listed, but only abnormal results are displayed)  Labs Reviewed  PREGNANCY, URINE  ____________________________________________  RADIOLOGY  Dg Chest 2 View  Result Date: 12/16/2018 CLINICAL DATA:  Cough. EXAM: CHEST - 2 VIEW COMPARISON:  08/09/2018 FINDINGS: The patient has faint bibasilar infiltrates. The heart size and pulmonary vascularity are normal. No effusions. No bone abnormality. IMPRESSION: Bibasilar pulmonary infiltrates. Electronically Signed   By: Francene BoyersJames  Maxwell M.D.   On: 12/16/2018 17:22     ____________________________________________   PROCEDURES  Procedure(s) performed:   Procedures  None ____________________________________________   INITIAL IMPRESSION / ASSESSMENT AND PLAN / ED COURSE  Pertinent labs & imaging results that were available during my care of the patient were reviewed by me and considered in my medical decision making (see chart for details).  Patient is overall well-appearing on my exam.  She is sitting in a dark room.  She has a normal neurologic exam.  No increased work of breathing.  Normal oxygen saturation.  Given her prolonged symptoms an x-ray was obtained which showed bibasilar infiltrates.  With normal vital signs I have extremely low suspicion that this represents a sepsis presentation.  She improved with migraine medication.  I am switching her to doxycycline and will have her follow with her PCP.   Discussed ED return precautions in detail.  ____________________________________________  FINAL CLINICAL IMPRESSION(S) / ED DIAGNOSES  Final diagnoses:  Community acquired pneumonia, unspecified laterality  Migraine without aura and without status migrainosus, not intractable     MEDICATIONS GIVEN DURING THIS VISIT:  Medications  diphenhydrAMINE (BENADRYL) capsule 25 mg (has no administration in time range)  ketorolac (TORADOL) 30 MG/ML injection 30 mg (30 mg Intramuscular Given 12/16/18 1732)  ondansetron (ZOFRAN) tablet 4 mg (4 mg Oral Given 12/16/18 1733)     NEW OUTPATIENT MEDICATIONS STARTED DURING THIS VISIT:  New Prescriptions   DOXYCYCLINE (VIBRAMYCIN) 100 MG CAPSULE    Take 1 capsule (100 mg total) by mouth 2 (two) times daily for 7 days.    Note:  This document was prepared using Dragon voice recognition software and may include unintentional dictation errors.  Alona BeneJoshua Analisse Randle, MD Emergency Medicine    Miyoko Hashimi, Arlyss RepressJoshua G, MD 12/17/18 [phone]

## 2018-12-16 NOTE — Discharge Instructions (Signed)
We believe that your symptoms are caused today by pneumonia, an infection in your lung(s).  Fortunately you should start to improve quickly after taking your antibiotics.  Please take the full course of antibiotics as prescribed and drink plenty of fluids.   ° °Follow up with your doctor within 1-2 days.  If you develop any new or worsening symptoms, including but not limited to fever in spite of taking over-the-counter ibuprofen and/or Tylenol, persistent vomiting, worsening shortness of breath, or other symptoms that concern you, please return to the Emergency Department immediately.  ° ° °Pneumonia °Pneumonia is an infection of the lungs.  °CAUSES °Pneumonia may be caused by bacteria or a virus. Usually, these infections are caused by breathing infectious particles into the lungs (respiratory tract). °SIGNS AND SYMPTOMS  °Cough. °Fever. °Chest pain. °Increased rate of breathing. °Wheezing. °Mucus production. °DIAGNOSIS  °If you have the common symptoms of pneumonia, your health care provider will typically confirm the diagnosis with a chest X-ray. The X-ray will show an abnormality in the lung (pulmonary infiltrate) if you have pneumonia. Other tests of your blood, urine, or sputum may be done to find the specific cause of your pneumonia. Your health care provider may also do tests (blood gases or pulse oximetry) to see how well your lungs are working. °TREATMENT  °Some forms of pneumonia may be spread to other people when you cough or sneeze. You may be asked to wear a mask before and during your exam. Pneumonia that is caused by bacteria is treated with antibiotic medicine. Pneumonia that is caused by the influenza virus may be treated with an antiviral medicine. Most other viral infections must run their course. These infections will not respond to antibiotics.  °HOME CARE INSTRUCTIONS  °Cough suppressants may be used if you are losing too much rest. However, coughing protects you by clearing your lungs. You  should avoid using cough suppressants if you can. °Your health care provider may have prescribed medicine if he or she thinks your pneumonia is caused by bacteria or influenza. Finish your medicine even if you start to feel better. °Your health care provider may also prescribe an expectorant. This loosens the mucus to be coughed up. °Take medicines only as directed by your health care provider. °Do not smoke. Smoking is a common cause of bronchitis and can contribute to pneumonia. If you are a smoker and continue to smoke, your cough may last several weeks after your pneumonia has cleared. °A cold steam vaporizer or humidifier in your room or home may help loosen mucus. °Coughing is often worse at night. Sleeping in a semi-upright position in a recliner or using a couple pillows under your head will help with this. °Get rest as you feel it is needed. Your body will usually let you know when you need to rest. °PREVENTION °A pneumococcal shot (vaccine) is available to prevent a common bacterial cause of pneumonia. This is usually suggested for: °People over 65 years old. °Patients on chemotherapy. °People with chronic lung problems, such as bronchitis or emphysema. °People with immune system problems. °If you are over 65 or have a high risk condition, you may receive the pneumococcal vaccine if you have not received it before. In some countries, a routine influenza vaccine is also recommended. This vaccine can help prevent some cases of pneumonia. You may be offered the influenza vaccine as part of your care. °If you smoke, it is time to quit. You may receive instructions on how to stop smoking. Your   health care provider can provide medicines and counseling to help you quit. °SEEK MEDICAL CARE IF: °You have a fever. °SEEK IMMEDIATE MEDICAL CARE IF:  °Your illness becomes worse. This is especially true if you are elderly or weakened from any other disease. °You cannot control your cough with suppressants and are losing  sleep. °You begin coughing up blood. °You develop pain which is getting worse or is uncontrolled with medicines. °Any of the symptoms which initially brought you in for treatment are getting worse rather than better. °You develop shortness of breath or chest pain. °MAKE SURE YOU:  °Understand these instructions. °Will watch your condition. °Will get help right away if you are not doing well or get worse. °Document Released: 11/02/2005 Document Revised: 03/19/2014 Document Reviewed: 01/22/2011 °[employer]® Patient Information ©2015 [employer]. This information is not intended to replace advice given to you by your health care provider. Make sure you discuss any questions you have with your health care provider. ° ° ° °

## 2018-12-16 NOTE — ED Notes (Signed)
Pt resting with eyes closed, given PO fluids and a warm blanket, denies any needs currently

## 2018-12-16 NOTE — ED Notes (Signed)
Patient transported to X-ray 

## 2019-01-03 ENCOUNTER — Emergency Department (HOSPITAL_BASED_OUTPATIENT_CLINIC_OR_DEPARTMENT_OTHER)
Admission: EM | Admit: 2019-01-03 | Discharge: 2019-01-03 | Disposition: A | Discharge status: Home / Self Care | Payer: BLUE CROSS/BLUE SHIELD | Attending: Emergency Medicine | Admitting: Emergency Medicine

## 2019-01-03 ENCOUNTER — Other Ambulatory Visit: Payer: Self-pay

## 2019-01-03 ENCOUNTER — Encounter (HOSPITAL_BASED_OUTPATIENT_CLINIC_OR_DEPARTMENT_OTHER): Payer: Self-pay | Admitting: *Deleted

## 2019-01-03 ENCOUNTER — Emergency Department (HOSPITAL_BASED_OUTPATIENT_CLINIC_OR_DEPARTMENT_OTHER): Payer: BLUE CROSS/BLUE SHIELD

## 2019-01-03 DIAGNOSIS — Z79899 Other long term (current) drug therapy: Secondary | ICD-10-CM | POA: Diagnosis not present

## 2019-01-03 DIAGNOSIS — Z87891 Personal history of nicotine dependence: Secondary | ICD-10-CM | POA: Insufficient documentation

## 2019-01-03 DIAGNOSIS — R51 Headache: Secondary | ICD-10-CM | POA: Diagnosis present

## 2019-01-03 DIAGNOSIS — G43909 Migraine, unspecified, not intractable, without status migrainosus: Secondary | ICD-10-CM | POA: Diagnosis not present

## 2019-01-03 LAB — PREGNANCY, URINE: Preg Test, Ur: NEGATIVE

## 2019-01-03 MED ORDER — METOCLOPRAMIDE HCL 10 MG PO TABS
10.0000 mg | ORAL_TABLET | Freq: Once | ORAL | Status: AC
  Administered 2019-01-03: 10 mg via ORAL
  Filled 2019-01-03: qty 1

## 2019-01-03 MED ORDER — CYCLOBENZAPRINE HCL 10 MG PO TABS: 10.0000 mg | ORAL_TABLET | Freq: Three times a day (TID) | ORAL | 0 refills | Status: DC | PRN

## 2019-01-03 MED ORDER — CYCLOBENZAPRINE HCL 10 MG PO TABS
10.0000 mg | ORAL_TABLET | Freq: Once | ORAL | Status: AC
  Administered 2019-01-03: 10 mg via ORAL
  Filled 2019-01-03: qty 1

## 2019-01-03 MED ORDER — KETOROLAC TROMETHAMINE 60 MG/2ML IM SOLN: 60.0000 mg | Freq: Once | INTRAMUSCULAR | Status: DC

## 2019-01-03 MED ORDER — KETOROLAC TROMETHAMINE 60 MG/2ML IM SOLN
60.0000 mg | Freq: Once | INTRAMUSCULAR | Status: AC
  Administered 2019-01-03: 60 mg via INTRAMUSCULAR
  Filled 2019-01-03: qty 2

## 2019-01-03 MED ORDER — BACLOFEN 10 MG PO TABS
10.0000 mg | ORAL_TABLET | Freq: Once | ORAL | Status: DC
  Filled 2019-01-03: qty 1

## 2019-01-03 NOTE — Discharge Instructions (Signed)
Please follow up with your lupus doctor and your lung doctor.

## 2019-01-03 NOTE — ED Provider Notes (Addendum)
[facility] EMERGENCY DEPARTMENT Provider Note   CSN: [id number] Arrival date & time: 01/03/19  0857    History   Chief Complaint Chief Complaint  Patient presents with  . Lupus    HPI Grace Bishop is a 33 y.o. female with PMH significant for uncontrolled lupus and migraines.     Patient states that she started having headache and joint pains last night. Similar to migraines that has had in the past with tight feeling in the back of her head with light sensitivity and seeing spots. No blurry vision or double vision. She also had all over body aches "like I got run over by a truck" in all her joints that is worse than her usual today. She denies any joint swelling. She has chronic baseline SOB that is not acutely worsened. No rashes. She has been off her plaquenil for the past 1 month because of side effects. She last saw her rheumatologist in Dec. No numbness/tingling.      Past Medical History:  Diagnosis Date  . Anxiety   . Lupus (HCC)    joint pain  . Migraines   . Ovarian cyst   . Stomach problems   . Thyroid disease     Patient Active Problem List   Diagnosis Date Noted  . Acute intractable headache   . Altered mental status   . Encephalopathy 12/24/2016    Past Surgical History:  Procedure Laterality Date  . OVARIAN CYST REMOVAL    . TONSILLECTOMY       OB History   No obstetric history on file.      Home Medications    Prior to Admission medications   Medication Sig Start Date End Date Taking? Authorizing Provider  baclofen (LIORESAL) 10 MG tablet Take 10 mg 3 (three) times daily as needed by mouth for muscle spasms.    [provider]  diphenhydrAMINE (BENADRYL) 25 MG tablet Take 1 tablet (25 mg total) by mouth every 6 (six) hours. 03/05/18   Renne Crigler, PA-C  HYDROcodone-acetaminophen (NORCO) 5-325 MG tablet Take 1 tablet by mouth every 4 (four) hours as needed (for pain). 12/01/18   Molpus, John, MD  hydroxychloroquine  (PLAQUENIL) 200 MG tablet Take by mouth daily.    [provider]  metoCLOPramide (REGLAN) 10 MG tablet Take 10 mg by mouth 4 (four) times daily.    [provider]  omeprazole (PRILOSEC) 20 MG capsule Take 1 capsule (20 mg total) daily by mouth. 09/24/17   Arby Barrette, MD  ondansetron (ZOFRAN ODT) 8 MG disintegrating tablet Take 1 tablet (8 mg total) by mouth every 8 (eight) hours as needed for nausea or vomiting. 12/01/18   Molpus, John, MD  SUMAtriptan (IMITREX) 50 MG tablet Take 1 tablet (50 mg total) by mouth every 2 (two) hours as needed for migraine. May repeat in 2 hours if headache persists or recurs. 09/11/18   Mortis, Sharyon Medicus, PA-C    Family History Family History  Problem Relation Age of Onset  . Thyroid disease Mother   . Drug abuse Father   . Sickle cell trait Sister     Social History Social History   Tobacco Use  . Smoking status: Former Smoker    Years: 2.00  . Smokeless tobacco: Never Used  Substance Use Topics  . Alcohol use: No  . Drug use: No     Allergies   Compazine [prochlorperazine]   Review of Systems Review of Systems  Constitutional: Negative for chills and  fever.  HENT: Negative for congestion and rhinorrhea.   Eyes: Positive for photophobia. Negative for pain and visual disturbance.  Respiratory: Positive for shortness of breath. Negative for cough, chest tightness, wheezing and stridor.   Cardiovascular: Negative for chest pain and palpitations.  Gastrointestinal: Negative for abdominal pain, diarrhea, nausea and vomiting.  Musculoskeletal: Positive for arthralgias and myalgias. Negative for joint swelling.  Skin: Negative for rash.  Neurological: Positive for headaches. Negative for weakness, light-headedness and numbness.     Physical Exam Updated Vital Signs BP 106/69 (BP Location: Left Arm)   Pulse 74   Temp 98.1 F (36.7 C) (Oral)   Resp 16   Ht 5\' 2"  (1.575 m)   Wt 54.9 kg   LMP 12/07/2018 (Exact Date)    SpO2 97%   BMI 22.13 kg/m   Physical Exam Constitutional:      General: She is not in acute distress.    Appearance: Normal appearance. She is not ill-appearing, toxic-appearing or diaphoretic.  HENT:     Head: Normocephalic and atraumatic.     Right Ear: External ear normal.     Left Ear: External ear normal.     Nose: Nose normal.     Mouth/Throat:     Mouth: Mucous membranes are moist.     Pharynx: Oropharynx is clear. No oropharyngeal exudate or posterior oropharyngeal erythema.  Eyes:     Conjunctiva/sclera: Conjunctivae normal.  Neck:     Musculoskeletal: Normal range of motion.  Cardiovascular:     Rate and Rhythm: Normal rate and regular rhythm.     Heart sounds: No murmur.  Pulmonary:     Effort: Pulmonary effort is normal.     Breath sounds: Normal breath sounds. No wheezing, rhonchi or rales.  Abdominal:     General: Abdomen is flat. Bowel sounds are normal. There is no distension.     Tenderness: There is no abdominal tenderness. There is no guarding or rebound.  Musculoskeletal: Normal range of motion.        General: No swelling, tenderness, deformity or signs of injury.  Skin:    General: Skin is warm and dry.     Capillary Refill: Capillary refill takes less than 2 seconds.     Findings: No rash.  Neurological:     General: No focal deficit present.     Mental Status: She is alert and oriented to person, place, and time.     Sensory: No sensory deficit.     Motor: No weakness.     Coordination: Coordination normal.  Psychiatric:        Mood and Affect: Mood normal.        Behavior: Behavior normal.      ED Treatments / Results  Labs (all labs ordered are listed, but only abnormal results are displayed) Labs Reviewed  PREGNANCY, URINE    EKG None  Radiology Dg Chest 2 View  Result Date: 01/03/2019 CLINICAL DATA:  Pt believes she is having a lupus flare up, she is experiencing joint paint and head tightness. Lupus, thyroid dz EXAM: CHEST -  2 VIEW COMPARISON:  12/16/2018 and older exams. FINDINGS: Interstitial hazy airspace opacities are noted at the lung bases, similar to the most recent prior exam, but increased compared older studies. Remainder of the lungs is clear. No pleural effusion or pneumothorax. Cardiac silhouette is normal in size. Normal mediastinal and hilar contours. Skeletal structures are within normal limits. IMPRESSION: 1. Lung base opacities similar to the most recent prior  exam. Findings may reflect interstitial fibrosis related to the patient's SLE, which are notably progressed since 2019. Consider infection if there are consistent clinical findings. 2. No other abnormalities. Electronically Signed   By: Amie Portland M.D.   On: 01/03/2019 10:23    Procedures Procedures (including critical care time)  Medications Ordered in ED Medications  metoCLOPramide (REGLAN) tablet 10 mg (10 mg Oral Given 01/03/19 0957)  cyclobenzaprine (FLEXERIL) tablet 10 mg (10 mg Oral Given 01/03/19 0957)  ketorolac (TORADOL) injection 60 mg (60 mg Intramuscular Given 01/03/19 1130)     Initial Impression / Assessment and Plan / ED Course  I have reviewed the triage vital signs and the nursing notes.  Pertinent labs & imaging results that were available during my care of the patient were reviewed by me and considered in my medical decision making (see chart for details).    Patient is here with c/o HA consistent with migraines she has had in the past with photosensitivity. Will treat with toradol, reglan and flexeril.  She also has complaints of joint pains and muscle aches that are chronic but acutely worsened. She does have uncontrolled lupus that is followed by rheumatology and pulmonology. Unlikely to be in a lupus flare as she has no joint swelling, new rashes or worsening SOB. However she was recently treated with doxycycline for PNA so CXR checked today which did not show any worsening which is reassuring.  Patient  symptomatically improved with migraine medications and feels well enough to return to work today. She was instructed to follow up with her rheumatologist and pulmonologist.  Final Clinical Impressions(s) / ED Diagnoses   Final diagnoses:  Migraine without status migrainosus, not intractable, unspecified migraine type    ED Discharge Orders         Ordered    cyclobenzaprine (FLEXERIL) 10 MG tablet  3 times daily PRN     01/03/19 1152           Leland Her, DO 01/03/19 1152    Little, Ambrose Finland, MD 01/06/19 1500

## 2019-01-03 NOTE — ED Triage Notes (Signed)
Pt believes she is having a lupus flare up, she is experiencing joint paint and head tightness.

## 2019-02-02 ENCOUNTER — Telehealth: Payer: Self-pay | Admitting: Emergency Medicine

## 2019-02-07 LAB — NOVEL CORONAVIRUS, NAA: SARS-CoV-2, NAA: NOT DETECTED

## 2019-06-05 DIAGNOSIS — R4701 Aphasia: Secondary | ICD-10-CM | POA: Insufficient documentation

## 2019-11-21 DIAGNOSIS — K8020 Calculus of gallbladder without cholecystitis without obstruction: Secondary | ICD-10-CM | POA: Insufficient documentation

## 2019-12-28 DIAGNOSIS — R000 Tachycardia, unspecified: Secondary | ICD-10-CM | POA: Insufficient documentation

## 2020-07-13 ENCOUNTER — Encounter (HOSPITAL_BASED_OUTPATIENT_CLINIC_OR_DEPARTMENT_OTHER): Payer: Self-pay | Admitting: Emergency Medicine

## 2020-07-13 ENCOUNTER — Other Ambulatory Visit: Payer: Self-pay

## 2020-07-13 ENCOUNTER — Emergency Department (HOSPITAL_BASED_OUTPATIENT_CLINIC_OR_DEPARTMENT_OTHER)
Admission: EM | Admit: 2020-07-13 | Discharge: 2020-07-13 | Disposition: A | Discharge status: Home / Self Care | Payer: 59 | Attending: Emergency Medicine | Admitting: Emergency Medicine

## 2020-07-13 ENCOUNTER — Emergency Department (HOSPITAL_BASED_OUTPATIENT_CLINIC_OR_DEPARTMENT_OTHER): Payer: 59

## 2020-07-13 DIAGNOSIS — Z87891 Personal history of nicotine dependence: Secondary | ICD-10-CM | POA: Insufficient documentation

## 2020-07-13 DIAGNOSIS — Z79899 Other long term (current) drug therapy: Secondary | ICD-10-CM | POA: Insufficient documentation

## 2020-07-13 DIAGNOSIS — R519 Headache, unspecified: Secondary | ICD-10-CM | POA: Insufficient documentation

## 2020-07-13 LAB — URINALYSIS, ROUTINE W REFLEX MICROSCOPIC
Bilirubin Urine: NEGATIVE
Glucose, UA: NEGATIVE mg/dL
Hgb urine dipstick: NEGATIVE
Ketones, ur: NEGATIVE mg/dL
Leukocytes,Ua: NEGATIVE
Nitrite: NEGATIVE
Protein, ur: NEGATIVE mg/dL
Specific Gravity, Urine: 1.025 (ref 1.005–1.030)
pH: 6 (ref 5.0–8.0)

## 2020-07-13 LAB — CBC WITH DIFFERENTIAL/PLATELET
Abs Immature Granulocytes: 0 10*3/uL (ref 0.00–0.07)
Basophils Absolute: 0 10*3/uL (ref 0.0–0.1)
Basophils Relative: 0 %
Eosinophils Absolute: 0 10*3/uL (ref 0.0–0.5)
Eosinophils Relative: 1 %
HCT: 34.3 % — ABNORMAL LOW (ref 36.0–46.0)
Hemoglobin: 10.6 g/dL — ABNORMAL LOW (ref 12.0–15.0)
Immature Granulocytes: 0 %
Lymphocytes Relative: 32 %
Lymphs Abs: 0.5 10*3/uL — ABNORMAL LOW (ref 0.7–4.0)
MCH: 27.2 pg (ref 26.0–34.0)
MCHC: 30.9 g/dL (ref 30.0–36.0)
MCV: 88.2 fL (ref 80.0–100.0)
Monocytes Absolute: 0.2 10*3/uL (ref 0.1–1.0)
Monocytes Relative: 11 %
Neutro Abs: 0.8 10*3/uL — ABNORMAL LOW (ref 1.7–7.7)
Neutrophils Relative %: 56 %
Platelets: 249 10*3/uL (ref 150–400)
RBC: 3.89 MIL/uL (ref 3.87–5.11)
RDW: 14.1 % (ref 11.5–15.5)
WBC: 1.5 10*3/uL — ABNORMAL LOW (ref 4.0–10.5)
nRBC: 0 % (ref 0.0–0.2)

## 2020-07-13 LAB — COMPREHENSIVE METABOLIC PANEL
ALT: 10 U/L (ref 0–44)
AST: 17 U/L (ref 15–41)
Albumin: 3.4 g/dL — ABNORMAL LOW (ref 3.5–5.0)
Alkaline Phosphatase: 45 U/L (ref 38–126)
Anion gap: 9 (ref 5–15)
BUN: 9 mg/dL (ref 6–20)
CO2: 26 mmol/L (ref 22–32)
Calcium: 9 mg/dL (ref 8.9–10.3)
Chloride: 104 mmol/L (ref 98–111)
Creatinine, Ser: 0.4 mg/dL — ABNORMAL LOW (ref 0.44–1.00)
GFR calc Af Amer: >60 mL/min (ref >60)
GFR calc non Af Amer: >60 mL/min (ref >60)
Glucose, Bld: 94 mg/dL (ref 70–99)
Potassium: 3.7 mmol/L (ref 3.5–5.1)
Sodium: 139 mmol/L (ref 135–145)
Total Bilirubin: 0.3 mg/dL (ref 0.3–1.2)
Total Protein: 8 g/dL (ref 6.5–8.1)

## 2020-07-13 LAB — PREGNANCY, URINE: Preg Test, Ur: NEGATIVE

## 2020-07-13 MED ORDER — SODIUM CHLORIDE 0.9 % IV BOLUS
1000.0000 mL | Freq: Once | INTRAVENOUS | Status: AC
  Administered 2020-07-13: 1000 mL via INTRAVENOUS

## 2020-07-13 MED ORDER — DIPHENHYDRAMINE HCL 50 MG/ML IJ SOLN
12.5000 mg | Freq: Once | INTRAMUSCULAR | Status: AC
  Administered 2020-07-13: 12.5 mg via INTRAVENOUS
  Filled 2020-07-13: qty 1

## 2020-07-13 MED ORDER — PROMETHAZINE HCL 25 MG/ML IJ SOLN
INTRAMUSCULAR | Status: AC
  Administered 2020-07-13: 12.5 mg via INTRAVENOUS
  Filled 2020-07-13: qty 1

## 2020-07-13 MED ORDER — PROMETHAZINE HCL 25 MG/ML IJ SOLN: 12.5000 mg | Freq: Once | INTRAMUSCULAR | Status: AC

## 2020-07-13 MED ORDER — ACETAMINOPHEN 500 MG PO TABS
1000.0000 mg | ORAL_TABLET | Freq: Once | ORAL | Status: AC
  Administered 2020-07-13: 1000 mg via ORAL
  Filled 2020-07-13: qty 2

## 2020-07-13 NOTE — Discharge Instructions (Addendum)
Please get a follow-up appointment with your neurologist regarding the symptoms you are experiencing today.  Continue your previously prescribed home medications for your migraines.  Return to ER if you develop uncontrolled headache, vomiting, fever or other new concerning symptom.

## 2020-07-13 NOTE — ED Triage Notes (Signed)
Headache x 2 weeks.  

## 2020-07-13 NOTE — ED Provider Notes (Signed)
[facility] EMERGENCY DEPARTMENT Provider Note   CSN: [id number] Arrival date & time: 07/13/20  0756     History Chief Complaint  Patient presents with  . Headache    Grace Bishop is a 34 y.o. female.  Reports that she has been having a bad headache for the past 2 weeks.  Reports history of migraines however this feels different than her regular migraine.  Reports her migraines usually only last 1 to 2 days.  Pain is currently 5 out of 10, sharp, stabbing, left-sided.  No associated numbness, weakness, vision changes, speech changes.  Went to her primary care doctor yesterday who attempted to order MRI brain, MRA head to further investigate, when this was not able to be completed on the same day, patient reports that she was recommended recommended going to the emergency room.  She checked in at a [facility] ER and they placed orders for the MR imaging but patient left AMA prior to these being completed.  Completed chart review of patient's PCP note yesterday, ER visit note yesterday.  HPI     Past Medical History:  Diagnosis Date  . Anxiety   . Lupus (HCC)    joint pain  . Migraines   . Ovarian cyst   . Stomach problems   . Thyroid disease     Patient Active Problem List   Diagnosis Date Noted  . Acute intractable headache   . Altered mental status   . Encephalopathy 12/24/2016    Past Surgical History:  Procedure Laterality Date  . OVARIAN CYST REMOVAL    . TONSILLECTOMY       OB History   No obstetric history on file.     Family History  Problem Relation Age of Onset  . Thyroid disease Mother   . Drug abuse Father   . Sickle cell trait Sister     Social History   Tobacco Use  . Smoking status: Former Smoker    Years: 2.00  . Smokeless tobacco: Never Used  Vaping Use  . Vaping Use: Never used  Substance Use Topics  . Alcohol use: No  . Drug use: No    Home Medications Prior to Admission medications   Medication Sig Start Date End Date  Taking? Authorizing Provider  famotidine (PEPCID) 10 MG tablet Take 10 mg by mouth 2 (two) times daily.   Yes [provider]  predniSONE (DELTASONE) 5 MG tablet Take 5 mg by mouth daily with breakfast.   Yes [provider]  baclofen (LIORESAL) 10 MG tablet Take 10 mg 3 (three) times daily as needed by mouth for muscle spasms.    [provider]  cyclobenzaprine (FLEXERIL) 10 MG tablet Take 1 tablet (10 mg total) by mouth 3 (three) times daily as needed for muscle spasms. 01/03/19   Leland Her, DO  diphenhydrAMINE (BENADRYL) 25 MG tablet Take 1 tablet (25 mg total) by mouth every 6 (six) hours. 03/05/18   Renne Crigler, PA-C  HYDROcodone-acetaminophen (NORCO) 5-325 MG tablet Take 1 tablet by mouth every 4 (four) hours as needed (for pain). 12/01/18   Molpus, John, MD  hydroxychloroquine (PLAQUENIL) 200 MG tablet Take by mouth daily.    [provider]  metoCLOPramide (REGLAN) 10 MG tablet Take 10 mg by mouth 4 (four) times daily.    [provider]  omeprazole (PRILOSEC) 20 MG capsule Take 1 capsule (20 mg total) daily by mouth. 09/24/17   Arby Barrette, MD  ondansetron (ZOFRAN ODT) 8 MG  disintegrating tablet Take 1 tablet (8 mg total) by mouth every 8 (eight) hours as needed for nausea or vomiting. 12/01/18   Molpus, John, MD  SUMAtriptan (IMITREX) 50 MG tablet Take 1 tablet (50 mg total) by mouth every 2 (two) hours as needed for migraine. May repeat in 2 hours if headache persists or recurs. 09/11/18   Mortis, Jerrel IvoryGabrielle I, PA-C    Allergies    Compazine [prochlorperazine], Decadron [dexamethasone], and Reglan [metoclopramide]  Review of Systems   Review of Systems  Constitutional: Negative for chills and fever.  HENT: Negative for ear pain and sore throat.   Eyes: Negative for pain and visual disturbance.  Respiratory: Negative for cough and shortness of breath.   Cardiovascular: Negative for chest pain and palpitations.  Gastrointestinal:  Negative for abdominal pain and vomiting.  Genitourinary: Negative for dysuria and hematuria.  Musculoskeletal: Negative for arthralgias and back pain.  Skin: Negative for color change and rash.  Neurological: Positive for headaches. Negative for seizures and syncope.  All other systems reviewed and are negative.   Physical Exam Updated Vital Signs BP 99/65 (BP Location: Right Arm)   Pulse 78   Temp 98.6 F (37 C) (Oral)   Resp 18   Ht 5\' 2"  (1.575 m)   Wt 52.6 kg   SpO2 100%   BMI 21.22 kg/m   Physical Exam Vitals and nursing note reviewed.  Constitutional:      General: She is not in acute distress.    Appearance: She is well-developed.  HENT:     Head: Normocephalic and atraumatic.  Eyes:     Conjunctiva/sclera: Conjunctivae normal.  Cardiovascular:     Rate and Rhythm: Normal rate and regular rhythm.     Heart sounds: No murmur heard.   Pulmonary:     Effort: Pulmonary effort is normal. No respiratory distress.     Breath sounds: Normal breath sounds.  Abdominal:     Palpations: Abdomen is soft.     Tenderness: There is no abdominal tenderness.  Musculoskeletal:     Cervical back: Neck supple.  Skin:    General: Skin is warm and dry.  Neurological:     Mental Status: She is alert.     Comments: AAOx3 CN 2-12 intact, speech clear visual fields intact 5/5 strength in b/l UE and LE Sensation to light touch intact in b/l UE and LE Normal FNF Normal gait     ED Results / Procedures / Treatments   Labs (all labs ordered are listed, but only abnormal results are displayed) Labs Reviewed  CBC WITH DIFFERENTIAL/PLATELET - Abnormal; Notable for the following components:      Result Value   WBC 1.5 (*)    Hemoglobin 10.6 (*)    HCT 34.3 (*)    Neutro Abs 0.8 (*)    Lymphs Abs 0.5 (*)    All other components within normal limits  COMPREHENSIVE METABOLIC PANEL - Abnormal; Notable for the following components:   Creatinine, Ser 0.40 (*)    Albumin 3.4 (*)      All other components within normal limits  URINALYSIS, ROUTINE W REFLEX MICROSCOPIC  PREGNANCY, URINE    EKG EKG Interpretation  Date/Time:  Saturday July 13 2020 08:56:52 EDT Ventricular Rate:  75 PR Interval:    QRS Duration: 78 QT Interval:  393 QTC Calculation: 439 R Axis:   9 Text Interpretation: Sinus rhythm Baseline wander in lead(s) I II aVR V4 Confirmed by Marianna Fussykstra, Sanvika Cuttino ([id number]) on 07/13/2020 9:06:03  AM   Radiology MR ANGIO HEAD WO CONTRAST  Result Date: 07/13/2020 CLINICAL DATA:  Headaches EXAM: MRI HEAD WITHOUT CONTRAST MRA HEAD WITHOUT CONTRAST TECHNIQUE: Multiplanar, multiecho pulse sequences of the brain and surrounding structures were obtained without intravenous contrast. Angiographic images of the head were obtained using MRA technique without contrast. COMPARISON:  MRI brain 2018 FINDINGS: MRI HEAD Brain: There is no acute infarction or intracranial hemorrhage. There is no intracranial mass, mass effect, or edema. There is no hydrocephalus or extra-axial fluid collection. Ventricles and sulci are normal in size and configuration. Three small foci of T2 hyperintensity in the supratentorial white matter likely reflect nonspecific gliosis/demyelination of doubtful clinical significance. Vascular: Major vessel flow voids at the skull base are preserved. Skull and upper cervical spine: Normal marrow signal is preserved. Sinuses/Orbits: Paranasal sinuses are aerated. Orbits are unremarkable. Other: Sella is unremarkable.  Mastoid air cells are clear. MRA HEAD Intracranial internal carotid arteries are patent. Middle and anterior cerebral arteries are patent. Intracranial vertebral arteries, basilar artery, posterior cerebral arteries are patent. Bilateral posterior communicating arteries are present. There is no significant stenosis or aneurysm. IMPRESSION: No evidence of recent infarction, hemorrhage, or mass. Normal MRA of the head. Electronically Signed   By: Guadlupe Spanish  M.D.   On: 07/13/2020 13:29   MR BRAIN WO CONTRAST  Result Date: 07/13/2020 CLINICAL DATA:  Headaches EXAM: MRI HEAD WITHOUT CONTRAST MRA HEAD WITHOUT CONTRAST TECHNIQUE: Multiplanar, multiecho pulse sequences of the brain and surrounding structures were obtained without intravenous contrast. Angiographic images of the head were obtained using MRA technique without contrast. COMPARISON:  MRI brain 2018 FINDINGS: MRI HEAD Brain: There is no acute infarction or intracranial hemorrhage. There is no intracranial mass, mass effect, or edema. There is no hydrocephalus or extra-axial fluid collection. Ventricles and sulci are normal in size and configuration. Three small foci of T2 hyperintensity in the supratentorial white matter likely reflect nonspecific gliosis/demyelination of doubtful clinical significance. Vascular: Major vessel flow voids at the skull base are preserved. Skull and upper cervical spine: Normal marrow signal is preserved. Sinuses/Orbits: Paranasal sinuses are aerated. Orbits are unremarkable. Other: Sella is unremarkable.  Mastoid air cells are clear. MRA HEAD Intracranial internal carotid arteries are patent. Middle and anterior cerebral arteries are patent. Intracranial vertebral arteries, basilar artery, posterior cerebral arteries are patent. Bilateral posterior communicating arteries are present. There is no significant stenosis or aneurysm. IMPRESSION: No evidence of recent infarction, hemorrhage, or mass. Normal MRA of the head. Electronically Signed   By: Guadlupe Spanish M.D.   On: 07/13/2020 13:29    Procedures Procedures (including critical care time)  Medications Ordered in ED Medications  sodium chloride 0.9 % bolus 1,000 mL (0 mLs Intravenous Stopped 07/13/20 1402)  acetaminophen (TYLENOL) tablet 1,000 mg (1,000 mg Oral Given 07/13/20 0905)  promethazine (PHENERGAN) injection 12.5 mg (12.5 mg Intravenous Given 07/13/20 0907)  diphenhydrAMINE (BENADRYL) injection 12.5 mg (12.5  mg Intravenous Given 07/13/20 9983)    ED Course  I have reviewed the triage vital signs and the nursing notes.  Pertinent labs & imaging results that were available during my care of the patient were reviewed by me and considered in my medical decision making (see chart for details).  Clinical Course as of Jul 14 709  Sat Jul 13, 2020  1405 Rechecked, symptoms near completely resolved, will discharge home   [RD]    Clinical Course User Index [RD] Milagros Loll, MD   MDM Rules/Calculators/A&P  34 year old with history of migraines presents to ER with concern for severe headache, different than her regular migraines.  Her primary doctor had recommended patient obtain MR imaging, went to outside ER and MRI brain and MRA head were ordered but patient left AMA. Given severity of her headache, different than her regular migraine, believe this is reasonable and we will proceed with MRI imaging.  MRI imaging of brain today is completely normal.  Her headache is completely resolved, will discharge home with plan for follow-up with primary doctor and her neurologist.  Final Clinical Impression(s) / ED Diagnoses Final diagnoses:  Headache disorder    Rx / DC Orders ED Discharge Orders    None       Milagros Loll, MD 07/14/20 0710

## 2020-10-05 ENCOUNTER — Emergency Department (HOSPITAL_BASED_OUTPATIENT_CLINIC_OR_DEPARTMENT_OTHER): Payer: 59

## 2020-10-05 ENCOUNTER — Other Ambulatory Visit: Payer: Self-pay

## 2020-10-05 ENCOUNTER — Encounter (HOSPITAL_BASED_OUTPATIENT_CLINIC_OR_DEPARTMENT_OTHER): Payer: Self-pay | Admitting: Emergency Medicine

## 2020-10-05 ENCOUNTER — Emergency Department (HOSPITAL_BASED_OUTPATIENT_CLINIC_OR_DEPARTMENT_OTHER)
Admission: EM | Admit: 2020-10-05 | Discharge: 2020-10-05 | Disposition: A | Discharge status: Home / Self Care | Payer: 59 | Attending: Emergency Medicine | Admitting: Emergency Medicine

## 2020-10-05 DIAGNOSIS — R0602 Shortness of breath: Secondary | ICD-10-CM | POA: Insufficient documentation

## 2020-10-05 DIAGNOSIS — R519 Headache, unspecified: Secondary | ICD-10-CM | POA: Diagnosis not present

## 2020-10-05 DIAGNOSIS — Z87891 Personal history of nicotine dependence: Secondary | ICD-10-CM | POA: Insufficient documentation

## 2020-10-05 DIAGNOSIS — Z20822 Contact with and (suspected) exposure to covid-19: Secondary | ICD-10-CM | POA: Diagnosis not present

## 2020-10-05 DIAGNOSIS — R079 Chest pain, unspecified: Secondary | ICD-10-CM | POA: Diagnosis not present

## 2020-10-05 DIAGNOSIS — R109 Unspecified abdominal pain: Secondary | ICD-10-CM

## 2020-10-05 LAB — COMPREHENSIVE METABOLIC PANEL
ALT: 13 U/L (ref 0–44)
AST: 18 U/L (ref 15–41)
Albumin: 3.4 g/dL — ABNORMAL LOW (ref 3.5–5.0)
Alkaline Phosphatase: 36 U/L — ABNORMAL LOW (ref 38–126)
Anion gap: 9 (ref 5–15)
BUN: 5 mg/dL — ABNORMAL LOW (ref 6–20)
CO2: 23 mmol/L (ref 22–32)
Calcium: 8.9 mg/dL (ref 8.9–10.3)
Chloride: 104 mmol/L (ref 98–111)
Creatinine, Ser: 0.42 mg/dL — ABNORMAL LOW (ref 0.44–1.00)
GFR, Estimated: >60 mL/min (ref >60)
Glucose, Bld: 85 mg/dL (ref 70–99)
Potassium: 3.8 mmol/L (ref 3.5–5.1)
Sodium: 136 mmol/L (ref 135–145)
Total Bilirubin: 0.6 mg/dL (ref 0.3–1.2)
Total Protein: 7.4 g/dL (ref 6.5–8.1)

## 2020-10-05 LAB — CBC WITH DIFFERENTIAL/PLATELET
Abs Immature Granulocytes: 0.01 10*3/uL (ref 0.00–0.07)
Basophils Absolute: 0 10*3/uL (ref 0.0–0.1)
Basophils Relative: 0 %
Eosinophils Absolute: 0 10*3/uL (ref 0.0–0.5)
Eosinophils Relative: 1 %
HCT: 32.2 % — ABNORMAL LOW (ref 36.0–46.0)
Hemoglobin: 10.1 g/dL — ABNORMAL LOW (ref 12.0–15.0)
Immature Granulocytes: 1 %
Lymphocytes Relative: 9 %
Lymphs Abs: 0.2 10*3/uL — ABNORMAL LOW (ref 0.7–4.0)
MCH: 26.5 pg (ref 26.0–34.0)
MCHC: 31.4 g/dL (ref 30.0–36.0)
MCV: 84.5 fL (ref 80.0–100.0)
Monocytes Absolute: 0.1 10*3/uL (ref 0.1–1.0)
Monocytes Relative: 5 %
Neutro Abs: 1.6 10*3/uL — ABNORMAL LOW (ref 1.7–7.7)
Neutrophils Relative %: 84 %
Platelets: 221 10*3/uL (ref 150–400)
RBC: 3.81 MIL/uL — ABNORMAL LOW (ref 3.87–5.11)
RDW: 14 % (ref 11.5–15.5)
WBC: 1.9 10*3/uL — ABNORMAL LOW (ref 4.0–10.5)
nRBC: 0 % (ref 0.0–0.2)

## 2020-10-05 LAB — URINALYSIS, ROUTINE W REFLEX MICROSCOPIC
Bilirubin Urine: NEGATIVE
Glucose, UA: NEGATIVE mg/dL
Hgb urine dipstick: NEGATIVE
Ketones, ur: 40 mg/dL — AB
Leukocytes,Ua: NEGATIVE
Nitrite: NEGATIVE
Protein, ur: NEGATIVE mg/dL
Specific Gravity, Urine: 1.02 (ref 1.005–1.030)
pH: 7 (ref 5.0–8.0)

## 2020-10-05 LAB — RESP PANEL BY RT-PCR (FLU A&B, COVID) ARPGX2
Influenza A by PCR: NEGATIVE
Influenza B by PCR: NEGATIVE
SARS Coronavirus 2 by RT PCR: NEGATIVE

## 2020-10-05 LAB — D-DIMER, QUANTITATIVE: D-Dimer, Quant: 3.1 ug/mL-FEU — ABNORMAL HIGH (ref 0.00–0.50)

## 2020-10-05 LAB — LIPASE, BLOOD: Lipase: 25 U/L (ref 11–51)

## 2020-10-05 LAB — TROPONIN I (HIGH SENSITIVITY): Troponin I (High Sensitivity): 3 ng/L (ref <18)

## 2020-10-05 LAB — PREGNANCY, URINE: Preg Test, Ur: NEGATIVE

## 2020-10-05 MED ORDER — METOCLOPRAMIDE HCL 10 MG PO TABS
10.0000 mg | ORAL_TABLET | Freq: Once | ORAL | Status: AC
  Administered 2020-10-05: 10 mg via ORAL
  Filled 2020-10-05: qty 1

## 2020-10-05 MED ORDER — DIPHENHYDRAMINE HCL 25 MG PO CAPS
25.0000 mg | ORAL_CAPSULE | Freq: Once | ORAL | Status: AC
  Administered 2020-10-05: 25 mg via ORAL
  Filled 2020-10-05: qty 1

## 2020-10-05 MED ORDER — IOHEXOL 350 MG/ML SOLN
100.0000 mL | Freq: Once | INTRAVENOUS | Status: AC | PRN
  Administered 2020-10-05: 73 mL via INTRAVENOUS

## 2020-10-05 NOTE — Discharge Instructions (Addendum)
Please follow closely with your primary care provider regarding your visit today. Return for any severely worsening shortness of breath, chest pain, or other concerning symptoms.

## 2020-10-05 NOTE — ED Notes (Signed)
Patient transported to X-ray 

## 2020-10-05 NOTE — ED Notes (Signed)
She tells me that she has felt some shob, plus occasional palpitations, plus left flank pain since receiving a Rituxan infusion some 8 days ago. She denies fever/cough and is in no distress. She also tells me that "they had to slow the Rituxan infusion while they were giving it because I had pain (points to her left flank area). She tells me that was her first Rituxan treatment.

## 2020-10-05 NOTE — ED Provider Notes (Signed)
[facility] EMERGENCY DEPARTMENT Provider Note   CSN: [id number] Arrival date & time: 10/05/20  1137     History Chief Complaint  Patient presents with  . Shortness of Breath    Grace Bishop is a 34 y.o. female w PMHx lupus, rheumatoid arthritis, chronic headache, presenting to the emergency department with multiple complaints.  Patient states that she had a rituximab infusion 1 week ago, this was her first infusion of this medication.  She states during the infusion she began having left-sided flank pain and some nausea.  Throughout the week she has noticed shortness of breath that is worse in the morning and worse with exertion.  She is also having persistent left flank pain into the left abdomen as well as sharp left-sided chest pain.  She reports frequent headaches, with persistent aura.  She has history of headache with aura however not normally as frequent.  She normally treats with Reglan, Benadryl and baclofen.  She also reports some intermittent nausea.  Denies fever, cough, urinary symptoms, diarrhea.  She states she spoke with her doctor about her symptoms who recommended she report for her next infusion next week, however she presents to the ED today to make sure that nothing else was going on, or that her symptoms were not related to the infusion.  The history is provided by the patient.       Past Medical History:  Diagnosis Date  . Anxiety   . Lupus (HCC)    joint pain  . Migraines   . Ovarian cyst   . Stomach problems   . Thyroid disease     Patient Active Problem List   Diagnosis Date Noted  . Acute intractable headache   . Altered mental status   . Encephalopathy 12/24/2016    Past Surgical History:  Procedure Laterality Date  . OVARIAN CYST REMOVAL    . TONSILLECTOMY       OB History   No obstetric history on file.     Family History  Problem Relation Age of Onset  . Thyroid disease Mother   . Drug abuse Father   . Sickle cell  trait Sister     Social History   Tobacco Use  . Smoking status: Former Smoker    Years: 2.00  . Smokeless tobacco: Never Used  Vaping Use  . Vaping Use: Never used  Substance Use Topics  . Alcohol use: No  . Drug use: No    Home Medications Prior to Admission medications   Medication Sig Start Date End Date Taking? Authorizing Provider  baclofen (LIORESAL) 10 MG tablet Take 10 mg 3 (three) times daily as needed by mouth for muscle spasms.    [provider]  cyclobenzaprine (FLEXERIL) 10 MG tablet Take 1 tablet (10 mg total) by mouth 3 (three) times daily as needed for muscle spasms. 01/03/19   Leland Her, DO  diphenhydrAMINE (BENADRYL) 25 MG tablet Take 1 tablet (25 mg total) by mouth every 6 (six) hours. 03/05/18   Renne Crigler, PA-C  famotidine (PEPCID) 10 MG tablet Take 10 mg by mouth 2 (two) times daily.    [provider]  HYDROcodone-acetaminophen (NORCO) 5-325 MG tablet Take 1 tablet by mouth every 4 (four) hours as needed (for pain). 12/01/18   Molpus, John, MD  hydroxychloroquine (PLAQUENIL) 200 MG tablet Take by mouth daily.    [provider]  metoCLOPramide (REGLAN) 10 MG tablet Take 10 mg by mouth 4 (four) times daily.  [provider]  omeprazole (PRILOSEC) 20 MG capsule Take 1 capsule (20 mg total) daily by mouth. 09/24/17   Arby Barrette, MD  ondansetron (ZOFRAN ODT) 8 MG disintegrating tablet Take 1 tablet (8 mg total) by mouth every 8 (eight) hours as needed for nausea or vomiting. 12/01/18   Molpus, John, MD  predniSONE (DELTASONE) 5 MG tablet Take 5 mg by mouth daily with breakfast.    [provider]  SUMAtriptan (IMITREX) 50 MG tablet Take 1 tablet (50 mg total) by mouth every 2 (two) hours as needed for migraine. May repeat in 2 hours if headache persists or recurs. 09/11/18   Mortis, Jerrel Ivory I, PA-C    Allergies    Compazine [prochlorperazine], Decadron [dexamethasone], and Reglan [metoclopramide]  Review  of Systems   Review of Systems  Constitutional: Positive for fatigue. Negative for fever.  Eyes: Positive for visual disturbance (aura, bilateral).  Respiratory: Positive for shortness of breath. Negative for cough.   Cardiovascular: Positive for chest pain. Negative for leg swelling.  Gastrointestinal: Positive for abdominal pain and nausea. Negative for vomiting.  Genitourinary: Positive for flank pain.  Neurological: Positive for headaches.  All other systems reviewed and are negative.   Physical Exam Updated Vital Signs BP 120/88 (BP Location: Left Arm)   Pulse 95   Temp 98 F (36.7 C) (Oral)   Resp 12   Ht 5\' 2"  (1.575 m)   Wt 47.2 kg   SpO2 100%   BMI 19.02 kg/m   Physical Exam Vitals and nursing note reviewed.  Constitutional:      Appearance: She is well-developed.     Comments: ill-appearing  HENT:     Head: Normocephalic and atraumatic.  Eyes:     Extraocular Movements: Extraocular movements intact.     Conjunctiva/sclera: Conjunctivae normal.     Pupils: Pupils are equal, round, and reactive to light.  Cardiovascular:     Rate and Rhythm: Normal rate and regular rhythm.  Pulmonary:     Effort: Pulmonary effort is normal.     Breath sounds: Normal breath sounds.  Abdominal:     General: Bowel sounds are normal.     Palpations: Abdomen is soft.     Tenderness: There is generalized abdominal tenderness.  Musculoskeletal:     Cervical back: Normal range of motion.     Right lower leg: No edema.     Left lower leg: No edema.  Skin:    General: Skin is warm.  Neurological:     Mental Status: She is alert.     Comments: Poor effort on exam. Strength appears equal to BUE and BLE, spontaneously moving all extremities without difficulty. Normal tone. Speech is fluent without aphasia.  Psychiatric:        Behavior: Behavior normal.     ED Results / Procedures / Treatments   Labs (all labs ordered are listed, but only abnormal results are displayed) Labs  Reviewed  CBC WITH DIFFERENTIAL/PLATELET - Abnormal; Notable for the following components:      Result Value   WBC 1.9 (*)    RBC 3.81 (*)    Hemoglobin 10.1 (*)    HCT 32.2 (*)    Neutro Abs 1.6 (*)    Lymphs Abs 0.2 (*)    All other components within normal limits  COMPREHENSIVE METABOLIC PANEL - Abnormal; Notable for the following components:   BUN 5 (*)    Creatinine, Ser 0.42 (*)    Albumin 3.4 (*)  Alkaline Phosphatase 36 (*)    All other components within normal limits  URINALYSIS, ROUTINE W REFLEX MICROSCOPIC - Abnormal; Notable for the following components:   Color, Urine STRAW (*)    Ketones, ur 40 (*)    All other components within normal limits  D-DIMER, QUANTITATIVE (NOT AT [facility]) - Abnormal; Notable for the following components:   D-Dimer, Quant 3.10 (*)    All other components within normal limits  RESP PANEL BY RT-PCR (FLU A&B, COVID) ARPGX2  LIPASE, BLOOD  PREGNANCY, URINE  TROPONIN I (HIGH SENSITIVITY)    EKG None  Radiology DG Chest 2 View  Result Date: 10/05/2020 CLINICAL DATA:  Shortness of breath, chest pain, history of lupus EXAM: CHEST - 2 VIEW COMPARISON:  08/14/2019 FINDINGS: Normal heart size and vascularity. Similar minor right costophrenic angle pleura parenchymal scarring. No acute airspace process, collapse or consolidation. No enlarging effusion or pneumothorax. Trachea midline. No acute osseous finding. IMPRESSION: Chronic right base scarring. No interval change or acute process by plain radiography. Electronically Signed   By: Judie Petit.  Shick M.D.   On: 10/05/2020 15:17   CT Angio Chest PE W/Cm &/Or Wo Cm  Result Date: 10/05/2020 CLINICAL DATA:  Shortness of breath and left-sided chest pain EXAM: CT ANGIOGRAPHY CHEST WITH CONTRAST TECHNIQUE: Multidetector CT imaging of the chest was performed using the standard protocol during bolus administration of intravenous contrast. Multiplanar CT image reconstructions and MIPs were obtained to evaluate the  vascular anatomy. CONTRAST:  41mL OMNIPAQUE IOHEXOL 350 MG/ML SOLN COMPARISON:  None. FINDINGS: Cardiovascular: There is a optimal opacification of the pulmonary arteries. There is no central,segmental, or subsegmental filling defects within the pulmonary arteries. The heart is normal in size. No pericardial effusion or thickening. No evidence right heart strain. There is normal three-vessel brachiocephalic anatomy without proximal stenosis. The thoracic aorta is normal in appearance. Mediastinum/Nodes: No hilar, mediastinal, or axillary adenopathy. Thyroid gland, trachea, and esophagus demonstrate no significant findings. Lungs/Pleura: The lungs are clear. No pleural effusion or pneumothorax. No airspace consolidation. Upper Abdomen: No acute abnormalities present in the visualized portions of the upper abdomen. Musculoskeletal: No chest wall abnormality. No acute or significant osseous findings. Review of the MIP images confirms the above findings. IMPRESSION: No central, segmental, or subsegmental pulmonary embolism. No other acute intrathoracic pathology to explain the patient's symptoms. Electronically Signed   By: Jonna Clark M.D.   On: 10/05/2020 17:33    Procedures Procedures (including critical care time)  Medications Ordered in ED Medications  metoCLOPramide (REGLAN) tablet 10 mg (10 mg Oral Given 10/05/20 1527)  diphenhydrAMINE (BENADRYL) capsule 25 mg (25 mg Oral Given 10/05/20 1527)  iohexol (OMNIPAQUE) 350 MG/ML injection 100 mL (73 mLs Intravenous Contrast Given 10/05/20 1654)    ED Course  I have reviewed the triage vital signs and the nursing notes.  Pertinent labs & imaging results that were available during my care of the patient were reviewed by me and considered in my medical decision making (see chart for details).    MDM Rules/Calculators/A&P                          Patient presenting with multiple symptoms after receiving rituximab infusion about 1 week ago.  She has  been having some increase shortness of breath from baseline, sharp chest pain, left flank and abdominal pain, recurrence of her chronic headache.  She presents today wondering if she should receive her next scheduled infusion for rituximab.  On exam, she looks chronically ill, febrile with stable vital signs.  Lungs are clear, O2 saturation is 100% on room air, with normal effort though is somewhat tachypneic.  Generalized abdominal tenderness is present without peritoneal signs.   No obvious focal neuro deficits.  Lab work ordered.  Headache treated with p.o. Reglan as patient states she cannot tolerate IV Reglan, IV fluids.  Chest x-ray is negative for infiltrate.  Labs are overall reassuring, leukopenia is present though this appears to be at patient's baseline.  No acute change in renal or hepatic function.  Lipase is within normal limits.  UA is without evidence of infection.  Troponin is negative.  D-dimer is elevated at 3.1.  This was followed by CTA of the chest which was negative for PE or other acute findings.  Covid swab is also negative.  Reevaluation, patient reports improvement in symptoms.  She appears to be feeling somewhat better.  Discussed I am unable to provide advice regarding her next scheduled rituximab infusion though recommend she discuss this with her prescribing provider.  No evidence of acute or life-threatening pathology as source of patient's symptoms today.  Discussed supportive measures and close outpatient follow-up.  Strict return precautions.  Patient verbalized understanding agrees with care plan.  Final Clinical Impression(s) / ED Diagnoses Final diagnoses:  SOB (shortness of breath)  Acute nonintractable headache, unspecified headache type  Left flank pain    Rx / DC Orders ED Discharge Orders    None       Nixie Laube, [place] N, PA-C 10/05/20 2356    Tilden Fossaees, Elizabeth, MD 10/06/20 [phone]

## 2020-10-05 NOTE — ED Triage Notes (Addendum)
Pt reports starting a new medication for lupus last week. Since beginning it she reports SOB and seeing an aura. Also reports L side pain and chest tightness.

## 2020-10-05 NOTE — ED Notes (Signed)
Patient in CT

## 2021-05-29 ENCOUNTER — Encounter (HOSPITAL_BASED_OUTPATIENT_CLINIC_OR_DEPARTMENT_OTHER): Payer: Self-pay

## 2021-05-29 ENCOUNTER — Other Ambulatory Visit: Payer: Self-pay

## 2021-05-29 ENCOUNTER — Emergency Department (HOSPITAL_BASED_OUTPATIENT_CLINIC_OR_DEPARTMENT_OTHER)
Admission: EM | Admit: 2021-05-29 | Discharge: 2021-05-29 | Discharge status: Against Medical Advice | Payer: No Typology Code available for payment source | Attending: Emergency Medicine | Admitting: Emergency Medicine

## 2021-05-29 DIAGNOSIS — R002 Palpitations: Secondary | ICD-10-CM | POA: Insufficient documentation

## 2021-05-29 DIAGNOSIS — R42 Dizziness and giddiness: Secondary | ICD-10-CM | POA: Insufficient documentation

## 2021-05-29 DIAGNOSIS — R519 Headache, unspecified: Secondary | ICD-10-CM | POA: Insufficient documentation

## 2021-05-29 DIAGNOSIS — Z5321 Procedure and treatment not carried out due to patient leaving prior to being seen by health care provider: Secondary | ICD-10-CM | POA: Diagnosis not present

## 2021-05-29 LAB — URINALYSIS, ROUTINE W REFLEX MICROSCOPIC
Bilirubin Urine: NEGATIVE
Glucose, UA: NEGATIVE mg/dL
Hgb urine dipstick: NEGATIVE
Ketones, ur: NEGATIVE mg/dL
Leukocytes,Ua: NEGATIVE
Nitrite: NEGATIVE
Protein, ur: NEGATIVE mg/dL
Specific Gravity, Urine: <1.005 — ABNORMAL LOW (ref 1.005–1.030)
pH: 7 (ref 5.0–8.0)

## 2021-05-29 LAB — PREGNANCY, URINE: Preg Test, Ur: NEGATIVE

## 2021-05-29 NOTE — ED Notes (Signed)
Pt states she does not want to wait any longer and just wants to leave. This RN had pt sign AMA form. Discussed the risks of leaving.

## 2021-05-29 NOTE — ED Triage Notes (Signed)
Pt states she woke ~7am with HA and dizziness, c/o palpitations x 2 hours-NAD-steady gait-later adds c/o "headaches with auras all week"

## 2021-08-19 ENCOUNTER — Other Ambulatory Visit: Payer: Self-pay

## 2021-08-19 ENCOUNTER — Emergency Department (HOSPITAL_BASED_OUTPATIENT_CLINIC_OR_DEPARTMENT_OTHER)
Admission: EM | Admit: 2021-08-19 | Discharge: 2021-08-19 | Disposition: A | Discharge status: Home / Self Care | Payer: No Typology Code available for payment source | Attending: Emergency Medicine | Admitting: Emergency Medicine

## 2021-08-19 ENCOUNTER — Encounter (HOSPITAL_BASED_OUTPATIENT_CLINIC_OR_DEPARTMENT_OTHER): Payer: Self-pay | Admitting: Emergency Medicine

## 2021-08-19 DIAGNOSIS — R519 Headache, unspecified: Secondary | ICD-10-CM | POA: Diagnosis present

## 2021-08-19 DIAGNOSIS — G43109 Migraine with aura, not intractable, without status migrainosus: Secondary | ICD-10-CM | POA: Diagnosis not present

## 2021-08-19 DIAGNOSIS — R531 Weakness: Secondary | ICD-10-CM | POA: Diagnosis not present

## 2021-08-19 DIAGNOSIS — Z87891 Personal history of nicotine dependence: Secondary | ICD-10-CM | POA: Insufficient documentation

## 2021-08-19 MED ORDER — KETOROLAC TROMETHAMINE 30 MG/ML IJ SOLN
15.0000 mg | Freq: Once | INTRAMUSCULAR | Status: DC
  Filled 2021-08-19: qty 1

## 2021-08-19 MED ORDER — KETOROLAC TROMETHAMINE 30 MG/ML IJ SOLN
15.0000 mg | Freq: Once | INTRAMUSCULAR | Status: AC
  Administered 2021-08-19: 15 mg via INTRAMUSCULAR

## 2021-08-19 MED ORDER — DIPHENHYDRAMINE HCL 50 MG/ML IJ SOLN
12.5000 mg | Freq: Once | INTRAMUSCULAR | Status: DC
  Filled 2021-08-19: qty 1

## 2021-08-19 MED ORDER — SODIUM CHLORIDE 0.9 % IV SOLN
12.5000 mg | Freq: Four times a day (QID) | INTRAVENOUS | Status: DC | PRN
  Filled 2021-08-19: qty 0.5

## 2021-08-19 NOTE — ED Notes (Signed)
Pt reports she is feeling much better and requesting d/c.  EDP made aware.

## 2021-08-19 NOTE — ED Notes (Signed)
Pt reports she does not want any of the ordered medication except for the Toradol IM.  EDP made aware.

## 2021-08-19 NOTE — ED Provider Notes (Signed)
[facility] EMERGENCY DEPARTMENT Provider Note   CSN: [id number] Arrival date & time: 08/19/21  4098     History Chief Complaint  Patient presents with   Headache    Grace Bishop is a 35 y.o. female.   Headache Associated symptoms: weakness   Associated symptoms: no abdominal pain, no back pain and no congestion   Patient presents with headache.  Left side of the head.  Dull.  Some radiation down the side of the head.  States that she had some numbness to her head neck arm and leg.  States she had a little difficulty walking because of it.  States she had a little bit of vision change in her left eye also.  Does have history of lupus but also history of complicated/hemiplegic migraines.  No fevers or chills.  No trauma.  No injury.    Past Medical History:  Diagnosis Date   Anxiety    Lupus (HCC)    joint pain   Migraines    Ovarian cyst    Stomach problems    Thyroid disease     Patient Active Problem List   Diagnosis Date Noted   Acute intractable headache    Altered mental status    Encephalopathy 12/24/2016    Past Surgical History:  Procedure Laterality Date   OVARIAN CYST REMOVAL     TONSILLECTOMY       OB History   No obstetric history on file.     Family History  Problem Relation Age of Onset   Thyroid disease Mother    Drug abuse Father    Sickle cell trait Sister     Social History   Tobacco Use   Smoking status: Former    Years: 2.00    Types: Cigarettes   Smokeless tobacco: Never  Vaping Use   Vaping Use: Never used  Substance Use Topics   Alcohol use: No   Drug use: No    Home Medications Prior to Admission medications   Medication Sig Start Date End Date Taking? Authorizing Provider  baclofen (LIORESAL) 10 MG tablet Take 10 mg 3 (three) times daily as needed by mouth for muscle spasms.    [provider]  cyclobenzaprine (FLEXERIL) 10 MG tablet Take 1 tablet (10 mg total) by mouth 3 (three) times daily as  needed for muscle spasms. 01/03/19   Leland Her, DO  diphenhydrAMINE (BENADRYL) 25 MG tablet Take 1 tablet (25 mg total) by mouth every 6 (six) hours. 03/05/18   Renne Crigler, PA-C  famotidine (PEPCID) 10 MG tablet Take 10 mg by mouth 2 (two) times daily.    [provider]  HYDROcodone-acetaminophen (NORCO) 5-325 MG tablet Take 1 tablet by mouth every 4 (four) hours as needed (for pain). 12/01/18   Molpus, John, MD  hydroxychloroquine (PLAQUENIL) 200 MG tablet Take by mouth daily.    [provider]  metoCLOPramide (REGLAN) 10 MG tablet Take 10 mg by mouth 4 (four) times daily.    [provider]  omeprazole (PRILOSEC) 20 MG capsule Take 1 capsule (20 mg total) daily by mouth. 09/24/17   Arby Barrette, MD  ondansetron (ZOFRAN ODT) 8 MG disintegrating tablet Take 1 tablet (8 mg total) by mouth every 8 (eight) hours as needed for nausea or vomiting. 12/01/18   Molpus, John, MD  predniSONE (DELTASONE) 5 MG tablet Take 5 mg by mouth daily with breakfast.    [provider]  SUMAtriptan (IMITREX) 50 MG tablet Take 1 tablet (  50 mg total) by mouth every 2 (two) hours as needed for migraine. May repeat in 2 hours if headache persists or recurs. 09/11/18   Mortis, Jerrel Ivory I, PA-C    Allergies    Compazine [prochlorperazine], Decadron [dexamethasone], and Reglan [metoclopramide]  Review of Systems   Review of Systems  Constitutional:  Negative for appetite change.  HENT:  Negative for congestion.   Eyes:  Negative for visual disturbance.  Gastrointestinal:  Negative for abdominal pain.  Genitourinary:  Negative for flank pain.  Musculoskeletal:  Negative for back pain.  Skin:  Negative for rash.  Neurological:  Positive for weakness and headaches.  Psychiatric/Behavioral:  Negative for confusion.    Physical Exam Updated Vital Signs BP 118/85   Pulse 89   Temp 99.2 F (37.3 C) (Oral)   Resp 14   Ht 5\' 2"  (1.575 m)   Wt 46.7 kg   LMP 07/30/2021 (Exact  Date)   SpO2 100%   BMI 18.84 kg/m   Physical Exam Vitals reviewed.  HENT:     Head: Atraumatic.  Eyes:     Pupils: Pupils are equal, round, and reactive to light.  Cardiovascular:     Rate and Rhythm: Regular rhythm.  Pulmonary:     Breath sounds: Normal breath sounds.  Abdominal:     Tenderness: There is no abdominal tenderness.  Musculoskeletal:     Cervical back: Normal range of motion and neck supple.  Skin:    General: Skin is warm.  Neurological:     Mental Status: She is alert and oriented to person, place, and time. Mental status is at baseline.     Comments: Sensation grossly intact upper and lower extremities.  Normal gait.  Normal strength.  Patient states she feels that the weakness is improved.    ED Results / Procedures / Treatments   Labs (all labs ordered are listed, but only abnormal results are displayed) Labs Reviewed - No data to display  EKG None  Radiology No results found.  Procedures Procedures   Medications Ordered in ED Medications  ketorolac (TORADOL) 30 MG/ML injection 15 mg (15 mg Intramuscular Given 08/19/21 0813)    ED Course  I have reviewed the triage vital signs and the nursing notes.  Pertinent labs & imaging results that were available during my care of the patient were reviewed by me and considered in my medical decision making (see chart for details).    MDM Rules/Calculators/A&P                           Patient presents with headache and some left-sided numbness/weakness.  History of headaches.  States that she has had complicated migraines in the past.  States that symptoms are improving.  Nonfocal exam.  Initially ordered migraine cocktail.  Patient later refused and states she just wants some Toradol.  Toradol given.  Feeling better.  Eager for discharge.  Discussed with patient's that we think this is likely a complicated migraine.  I do not feel a need MRI or further work-up at this time.  Discharge home with  outpatient follow-up as needed. Final Clinical Impression(s) / ED Diagnoses Final diagnoses:  Complicated migraine    Rx / DC Orders ED Discharge Orders     None        10/19/21, MD 08/19/21 [phone]

## 2021-08-19 NOTE — ED Triage Notes (Signed)
Pt is c/o headache and numbness to the left side of her head and the top of her head  Pt is c/o neck stiffness and generalized body weakness  States sxs started yesterday

## 2021-08-22 ENCOUNTER — Emergency Department (HOSPITAL_BASED_OUTPATIENT_CLINIC_OR_DEPARTMENT_OTHER)
Admission: EM | Admit: 2021-08-22 | Discharge: 2021-08-22 | Disposition: A | Discharge status: Home / Self Care | Payer: No Typology Code available for payment source | Attending: Emergency Medicine | Admitting: Emergency Medicine

## 2021-08-22 ENCOUNTER — Other Ambulatory Visit: Payer: Self-pay

## 2021-08-22 ENCOUNTER — Encounter (HOSPITAL_BASED_OUTPATIENT_CLINIC_OR_DEPARTMENT_OTHER): Payer: Self-pay | Admitting: Emergency Medicine

## 2021-08-22 DIAGNOSIS — G43109 Migraine with aura, not intractable, without status migrainosus: Secondary | ICD-10-CM | POA: Insufficient documentation

## 2021-08-22 DIAGNOSIS — R519 Headache, unspecified: Secondary | ICD-10-CM | POA: Diagnosis present

## 2021-08-22 DIAGNOSIS — Z87891 Personal history of nicotine dependence: Secondary | ICD-10-CM | POA: Diagnosis not present

## 2021-08-22 MED ORDER — DIPHENHYDRAMINE HCL 50 MG/ML IJ SOLN
25.0000 mg | Freq: Once | INTRAMUSCULAR | Status: AC
  Administered 2021-08-22: 25 mg via INTRAVENOUS
  Filled 2021-08-22: qty 1

## 2021-08-22 MED ORDER — KETOROLAC TROMETHAMINE 15 MG/ML IJ SOLN
15.0000 mg | Freq: Once | INTRAMUSCULAR | Status: AC
  Administered 2021-08-22: 15 mg via INTRAVENOUS
  Filled 2021-08-22: qty 1

## 2021-08-22 MED ORDER — METOCLOPRAMIDE HCL 10 MG PO TABS
5.0000 mg | ORAL_TABLET | Freq: Once | ORAL | Status: AC
  Administered 2021-08-22: 5 mg via ORAL
  Filled 2021-08-22: qty 1

## 2021-08-22 NOTE — ED Triage Notes (Signed)
Pt c/o headache and right sided weakness. Pt seen here 10/4 for complicated migraine.

## 2021-08-22 NOTE — ED Provider Notes (Signed)
[facility] Provider Note: Grace Dell, MD, FACEP  CSN: [id number] MRN: [id number] ARRIVAL: 08/22/21 at 0234 ROOM: [facility]07/[facility]   CHIEF COMPLAINT  Headache   HISTORY OF PRESENT ILLNESS  08/22/21 2:52 AM Grace Bishop is a 35 y.o. female with a history of complex migraines.  She is here stating she has had difficulty moving her right side for the past 4 days.  The symptoms come and go.  She has also had a headache on the left occiput which has also been coming and going.  She has had some mild nausea with this.  She states she is not taking anything for her symptoms.  She rates her headache as a 7 out of 10, throbbing in nature.  She seen here 3 days ago and refused IV medications requesting only IM Toradol after which she left in improved condition.  Her symptoms did not seem severe enough at that time for imaging and CT and MRI scans of the brain in the past (2021) have been negative.  Nursing staff reports the patient appears to have difficulty talking but, when on her cell phone, seems to have no difficulty.   Past Medical History:  Diagnosis Date   Anxiety    Lupus (HCC)    joint pain   Migraines    Ovarian cyst    Stomach problems    Thyroid disease     Past Surgical History:  Procedure Laterality Date   OVARIAN CYST REMOVAL     TONSILLECTOMY      Family History  Problem Relation Age of Onset   Thyroid disease Mother    Drug abuse Father    Sickle cell trait Sister     Social History   Tobacco Use   Smoking status: Former    Years: 2.00    Types: Cigarettes   Smokeless tobacco: Never  Vaping Use   Vaping Use: Never used  Substance Use Topics   Alcohol use: No   Drug use: No    Prior to Admission medications   Medication Sig Start Date End Date Taking? Authorizing Provider  baclofen (LIORESAL) 10 MG tablet Take 10 mg 3 (three) times daily as needed by mouth for muscle spasms.    [provider]  cyclobenzaprine (FLEXERIL) 10 MG  tablet Take 1 tablet (10 mg total) by mouth 3 (three) times daily as needed for muscle spasms. 01/03/19   Leland Her, DO  diphenhydrAMINE (BENADRYL) 25 MG tablet Take 1 tablet (25 mg total) by mouth every 6 (six) hours. 03/05/18   Renne Crigler, PA-C  famotidine (PEPCID) 10 MG tablet Take 10 mg by mouth 2 (two) times daily.    [provider]  HYDROcodone-acetaminophen (NORCO) 5-325 MG tablet Take 1 tablet by mouth every 4 (four) hours as needed (for pain). 12/01/18   Ashanti Ratti, MD  hydroxychloroquine (PLAQUENIL) 200 MG tablet Take by mouth daily.    [provider]  metoCLOPramide (REGLAN) 10 MG tablet Take 10 mg by mouth 4 (four) times daily.    [provider]  omeprazole (PRILOSEC) 20 MG capsule Take 1 capsule (20 mg total) daily by mouth. 09/24/17   Arby Barrette, MD  ondansetron (ZOFRAN ODT) 8 MG disintegrating tablet Take 1 tablet (8 mg total) by mouth every 8 (eight) hours as needed for nausea or vomiting. 12/01/18   Adalea Handler, MD  predniSONE (DELTASONE) 5 MG tablet Take 5 mg by mouth daily with breakfast.    [provider]  SUMAtriptan (  IMITREX) 50 MG tablet Take 1 tablet (50 mg total) by mouth every 2 (two) hours as needed for migraine. May repeat in 2 hours if headache persists or recurs. 09/11/18   Mortis, Jerrel Ivory I, PA-C    Allergies Compazine [prochlorperazine], Decadron [dexamethasone], and Reglan [metoclopramide]   REVIEW OF SYSTEMS  Negative except as noted here or in the History of Present Illness.   PHYSICAL EXAMINATION  Initial Vital Signs Blood pressure 124/89, pulse 92, temperature 99 F (37.2 C), temperature source Oral, resp. rate 16, height 5\' 2"  (1.575 m), weight 46.7 kg, last menstrual period 07/30/2021, SpO2 100 %.  Examination General: Well-developed, thin female in no acute distress; appearance consistent with age of record HENT: normocephalic; atraumatic Eyes: pupils equal, round and reactive to light; extraocular  muscles intact Neck: supple Heart: regular rate and rhythm Lungs: clear to auscultation bilaterally Abdomen: soft; nondistended; nontender; bowel sounds present Extremities: No deformity; full range of motion; pulses normal Neurologic: Awake, alert; halting speech; seeming difficulty answering some detailed questions; mild right hemiparesis Skin: Warm and dry Psychiatric: Flat affect   RESULTS  Summary of this visit's results, reviewed and interpreted by myself:   EKG Interpretation  Date/Time:    Ventricular Rate:    PR Interval:    QRS Duration:   QT Interval:    QTC Calculation:   R Axis:     Text Interpretation:         Laboratory Studies: No results found for this or any previous visit (from the past 24 hour(s)). Imaging Studies: No results found.  ED COURSE and MDM  Nursing notes, initial and subsequent vitals signs, including pulse oximetry, reviewed and interpreted by myself.  Vitals:   08/22/21 0243 08/22/21 0245 08/22/21 0406  BP:  124/89 125/83  Pulse:  92 85  Resp:  16 18  Temp:  99 F (37.2 C)   TempSrc:  Oral   SpO2:  100% 100%  Weight: 46.7 kg    Height: 5\' 2"  (1.575 m)     Medications  metoCLOPramide (REGLAN) tablet 5 mg (5 mg Oral Given 08/22/21 0311)  diphenhydrAMINE (BENADRYL) injection 25 mg (25 mg Intravenous Given 08/22/21 0321)  ketorolac (TORADOL) 15 MG/ML injection 15 mg (15 mg Intravenous Given 08/22/21 0320)    4:59 AM Patient feeling better, would like to go home.  This could represent a complex migraine but there may also be since psychogenic component to it   PROCEDURES  Procedures   ED DIAGNOSES     ICD-10-CM   1. Complicated migraine  G43.109          Grace Hokenson, MD 08/22/21 0500

## 2022-01-27 ENCOUNTER — Emergency Department (HOSPITAL_BASED_OUTPATIENT_CLINIC_OR_DEPARTMENT_OTHER)
Admission: EM | Admit: 2022-01-27 | Discharge: 2022-01-27 | Disposition: A | Discharge status: Home / Self Care | Payer: No Typology Code available for payment source | Attending: Emergency Medicine | Admitting: Emergency Medicine

## 2022-01-27 ENCOUNTER — Encounter (HOSPITAL_BASED_OUTPATIENT_CLINIC_OR_DEPARTMENT_OTHER): Payer: Self-pay | Admitting: Emergency Medicine

## 2022-01-27 ENCOUNTER — Other Ambulatory Visit: Payer: Self-pay

## 2022-01-27 DIAGNOSIS — E876 Hypokalemia: Secondary | ICD-10-CM | POA: Insufficient documentation

## 2022-01-27 DIAGNOSIS — Z20822 Contact with and (suspected) exposure to covid-19: Secondary | ICD-10-CM | POA: Insufficient documentation

## 2022-01-27 DIAGNOSIS — G43809 Other migraine, not intractable, without status migrainosus: Secondary | ICD-10-CM | POA: Diagnosis not present

## 2022-01-27 DIAGNOSIS — R42 Dizziness and giddiness: Secondary | ICD-10-CM | POA: Diagnosis present

## 2022-01-27 DIAGNOSIS — J029 Acute pharyngitis, unspecified: Secondary | ICD-10-CM | POA: Diagnosis not present

## 2022-01-27 LAB — COMPREHENSIVE METABOLIC PANEL
ALT: 24 U/L (ref 0–44)
AST: 22 U/L (ref 15–41)
Albumin: 3.5 g/dL (ref 3.5–5.0)
Alkaline Phosphatase: 40 U/L (ref 38–126)
Anion gap: 8 (ref 5–15)
BUN: 9 mg/dL (ref 6–20)
CO2: 28 mmol/L (ref 22–32)
Calcium: 9 mg/dL (ref 8.9–10.3)
Chloride: 104 mmol/L (ref 98–111)
Creatinine, Ser: 0.47 mg/dL (ref 0.44–1.00)
GFR, Estimated: >60 mL/min (ref >60)
Glucose, Bld: 87 mg/dL (ref 70–99)
Potassium: 3.3 mmol/L — ABNORMAL LOW (ref 3.5–5.1)
Sodium: 140 mmol/L (ref 135–145)
Total Bilirubin: 0.3 mg/dL (ref 0.3–1.2)
Total Protein: 6.7 g/dL (ref 6.5–8.1)

## 2022-01-27 LAB — URINALYSIS, ROUTINE W REFLEX MICROSCOPIC
Bilirubin Urine: NEGATIVE
Glucose, UA: NEGATIVE mg/dL
Hgb urine dipstick: NEGATIVE
Ketones, ur: NEGATIVE mg/dL
Leukocytes,Ua: NEGATIVE
Nitrite: NEGATIVE
Protein, ur: NEGATIVE mg/dL
Specific Gravity, Urine: 1.015 (ref 1.005–1.030)
pH: 7 (ref 5.0–8.0)

## 2022-01-27 LAB — CBC WITH DIFFERENTIAL/PLATELET
Abs Immature Granulocytes: 0 10*3/uL (ref 0.00–0.07)
Basophils Absolute: 0 10*3/uL (ref 0.0–0.1)
Basophils Relative: 0 %
Eosinophils Absolute: 0 10*3/uL (ref 0.0–0.5)
Eosinophils Relative: 1 %
HCT: 33.2 % — ABNORMAL LOW (ref 36.0–46.0)
Hemoglobin: 10.5 g/dL — ABNORMAL LOW (ref 12.0–15.0)
Immature Granulocytes: 0 %
Lymphocytes Relative: 8 %
Lymphs Abs: 0.2 10*3/uL — ABNORMAL LOW (ref 0.7–4.0)
MCH: 27.3 pg (ref 26.0–34.0)
MCHC: 31.6 g/dL (ref 30.0–36.0)
MCV: 86.5 fL (ref 80.0–100.0)
Monocytes Absolute: 0.2 10*3/uL (ref 0.1–1.0)
Monocytes Relative: 9 %
Neutro Abs: 1.9 10*3/uL (ref 1.7–7.7)
Neutrophils Relative %: 82 %
Platelets: 145 10*3/uL — ABNORMAL LOW (ref 150–400)
RBC: 3.84 MIL/uL — ABNORMAL LOW (ref 3.87–5.11)
RDW: 13.6 % (ref 11.5–15.5)
WBC: 2.2 10*3/uL — ABNORMAL LOW (ref 4.0–10.5)
nRBC: 0 % (ref 0.0–0.2)

## 2022-01-27 LAB — RESP PANEL BY RT-PCR (FLU A&B, COVID) ARPGX2
Influenza A by PCR: NEGATIVE
Influenza B by PCR: NEGATIVE
SARS Coronavirus 2 by RT PCR: NEGATIVE

## 2022-01-27 LAB — HCG, SERUM, QUALITATIVE: Preg, Serum: NEGATIVE

## 2022-01-27 LAB — GROUP A STREP BY PCR: Group A Strep by PCR: NOT DETECTED

## 2022-01-27 MED ORDER — SODIUM CHLORIDE 0.9 % IV BOLUS
1000.0000 mL | Freq: Once | INTRAVENOUS | Status: AC
  Administered 2022-01-27: 1000 mL via INTRAVENOUS

## 2022-01-27 MED ORDER — ONDANSETRON 4 MG PO TBDP: 4.0000 mg | ORAL_TABLET | Freq: Three times a day (TID) | ORAL | 0 refills | Status: DC | PRN

## 2022-01-27 MED ORDER — KETOROLAC TROMETHAMINE 30 MG/ML IJ SOLN
30.0000 mg | Freq: Once | INTRAMUSCULAR | Status: AC
  Administered 2022-01-27: 30 mg via INTRAVENOUS
  Filled 2022-01-27: qty 1

## 2022-01-27 NOTE — ED Notes (Signed)
ED Provider at bedside. 

## 2022-01-27 NOTE — ED Provider Notes (Signed)
?[facility] EMERGENCY DEPARTMENT ?Provider Note ? ? ?CSN: [id number] ?Arrival date & time: 01/27/22  9326 ? ?  ? ?History ? ?Chief Complaint  ?Patient presents with  ? Dizziness  ? Headache  ? ? ?Grace Bishop is a 36 y.o. female. ? ?Pt is a 36 yo female with a hx of migraines, Lupus, and thyroid problems.  She has been feeling dizzy for 2 weeks.  She has a severe headache.  She is nauseous.  She is late for her period (LMP 2/6).  Pt feels like this is her usual migraine.  She also has a sore throat. ? ? ?  ? ?Home Medications ?Prior to Admission medications   ?Medication Sig Start Date End Date Taking? Authorizing Provider  ?ondansetron (ZOFRAN-ODT) 4 MG disintegrating tablet Take 1 tablet (4 mg total) by mouth every 8 (eight) hours as needed for nausea or vomiting. 01/27/22  Yes Jacalyn Lefevre, MD  ?baclofen (LIORESAL) 10 MG tablet Take 10 mg 3 (three) times daily as needed by mouth for muscle spasms.    [provider]  ?cyclobenzaprine (FLEXERIL) 10 MG tablet Take 1 tablet (10 mg total) by mouth 3 (three) times daily as needed for muscle spasms. 01/03/19   Leland Her, DO  ?diphenhydrAMINE (BENADRYL) 25 MG tablet Take 1 tablet (25 mg total) by mouth every 6 (six) hours. 03/05/18   Renne Crigler, PA-C  ?famotidine (PEPCID) 10 MG tablet Take 10 mg by mouth 2 (two) times daily.    [provider]  ?HYDROcodone-acetaminophen (NORCO) 5-325 MG tablet Take 1 tablet by mouth every 4 (four) hours as needed (for pain). 12/01/18   Molpus, John, MD  ?hydroxychloroquine (PLAQUENIL) 200 MG tablet Take by mouth daily.    [provider]  ?metoCLOPramide (REGLAN) 10 MG tablet Take 10 mg by mouth 4 (four) times daily.    [provider]  ?omeprazole (PRILOSEC) 20 MG capsule Take 1 capsule (20 mg total) daily by mouth. 09/24/17   Arby Barrette, MD  ?predniSONE (DELTASONE) 5 MG tablet Take 5 mg by mouth daily with breakfast.    [provider]  ?SUMAtriptan (IMITREX) 50 MG  tablet Take 1 tablet (50 mg total) by mouth every 2 (two) hours as needed for migraine. May repeat in 2 hours if headache persists or recurs. 09/11/18   Mortis, Sharyon Medicus, PA-C  ?   ? ?Allergies    ?Compazine [prochlorperazine], Decadron [dexamethasone], and Reglan [metoclopramide]   ? ?Review of Systems   ?Review of Systems  ?Gastrointestinal:  Positive for nausea.  ?Neurological:  Positive for dizziness, weakness and headaches.  ?All other systems reviewed and are negative. ? ?Physical Exam ?Updated Vital Signs ?BP 130/80 (BP Location: Left Arm)   Pulse 80   Temp 98.6 ?F (37 ?C) (Oral)   Resp 18   Ht 5\' 2"  (1.575 m)   Wt 47.2 kg   LMP 12/22/2021   SpO2 100%   BMI 19.02 kg/m?  ?Physical Exam ?Vitals and nursing note reviewed.  ?Constitutional:   ?   Appearance: She is well-developed.  ?HENT:  ?   Head: Normocephalic and atraumatic.  ?   Mouth/Throat:  ?   Mouth: Mucous membranes are moist.  ?   Pharynx: Oropharynx is clear.  ?Eyes:  ?   Extraocular Movements: Extraocular movements intact.  ?Cardiovascular:  ?   Rate and Rhythm: Normal rate and regular rhythm.  ?   Heart sounds: Normal heart sounds.  ?Pulmonary:  ?   Effort: Pulmonary effort  is normal.  ?   Breath sounds: Normal breath sounds.  ?Abdominal:  ?   Palpations: Abdomen is soft.  ?Musculoskeletal:     ?   General: Normal range of motion.  ?   Cervical back: Normal range of motion.  ?Skin: ?   General: Skin is warm.  ?Neurological:  ?   Mental Status: She is alert and oriented to person, place, and time.  ?Psychiatric:     ?   Mood and Affect: Mood normal.     ?   Behavior: Behavior normal.  ? ? ?ED Results / Procedures / Treatments   ?Labs ?(all labs ordered are listed, but only abnormal results are displayed) ?Labs Reviewed  ?COMPREHENSIVE METABOLIC PANEL - Abnormal; Notable for the following components:  ?    Result Value  ? Potassium 3.3 (*)   ? All other components within normal limits  ?CBC WITH DIFFERENTIAL/PLATELET - Abnormal; Notable for  the following components:  ? WBC 2.2 (*)   ? RBC 3.84 (*)   ? Hemoglobin 10.5 (*)   ? HCT 33.2 (*)   ? Platelets 145 (*)   ? Lymphs Abs 0.2 (*)   ? All other components within normal limits  ?RESP PANEL BY RT-PCR (FLU A&B, COVID) [id number]  ?GROUP A STREP BY PCR  ?URINALYSIS, ROUTINE W REFLEX MICROSCOPIC  ?HCG, SERUM, QUALITATIVE  ? ? ?EKG ?EKG Interpretation ? ?Date/Time:  Tuesday January 27 2022 07:24:07 EDT ?Ventricular Rate:  87 ?PR Interval:  140 ?QRS Duration: 78 ?QT Interval:  366 ?QTC Calculation: 441 ?R Axis:   21 ?Text Interpretation: Sinus rhythm No significant change since last tracing Confirmed by Jacalyn LefevreHaviland, Breandan People [id number]) on 01/27/2022 7:26:33 AM ? ?Radiology ?No results found. ? ?Procedures ?Procedures  ? ? ?Medications Ordered in ED ?Medications  ?sodium chloride 0.9 % bolus 1,000 mL (0 mLs Intravenous Stopped 01/27/22 0848)  ?ketorolac (TORADOL) 30 MG/ML injection 30 mg (30 mg Intravenous Given 01/27/22 0853)  ?sodium chloride 0.9 % bolus 1,000 mL (1,000 mLs Intravenous New Bag/Given 01/27/22 0852)  ? ? ?ED Course/ Medical Decision Making/ A&P ?  ?                        ?Medical Decision Making ?Amount and/or Complexity of Data Reviewed ?Labs: ordered. ? ?Risk ?Prescription drug management. ? ? ?This patient presents to the ED for concern of dizziness, headache, this involves an extensive number of treatment options, and is a complaint that carries with it a high risk of complications and morbidity.  The differential diagnosis includes migraine, pregnancy, electrolyte abn, other headache ? ? ?Co morbidities that complicate the patient evaluation ? ?Migraines, lupus ? ? ?Additional history obtained: ? ?Additional history obtained from epic chart review ? ?Lab Tests: ? ?I Ordered, and personally interpreted labs.  The pertinent results include:  pancytopenia which is chronic, k is slightly low at 3.3, ua is neg.  Covid/flu/strep neg.  Preg is neg. ? ? ?Cardiac Monitoring: ? ?The patient was maintained on a  cardiac monitor.  I personally viewed and interpreted the cardiac monitored which showed an underlying rhythm of: nsr ? ? ?Medicines ordered and prescription drug management: ? ?I ordered medication including IVFs, toradol, zofran  for dehydration/headache/nausea  ?Reevaluation of the patient after these medicines showed that the patient improved ?I have reviewed the patients home medicines and have made adjustments as needed ? ? ?Problem List / ED Course: ? ?Migraine:  pt feels much better after  fluids/toradol/zofran.  She is stable for d/c.  Return if worse. F/u with pcp. ? ? ?Reevaluation: ? ?After the interventions noted above, I reevaluated the patient and found that they have :improved ? ? ?Social Determinants of Health: ? ?Lives at home with husband ? ? ?Dispostion: ? ?After consideration of the diagnostic results and the patients response to treatment, I feel that the patent would benefit from discharge with outpatient f/u.   ? ? ? ? ? ? ? ?Final Clinical Impression(s) / ED Diagnoses ?Final diagnoses:  ?Other migraine without status migrainosus, not intractable  ? ? ?Rx / DC Orders ?ED Discharge Orders   ? ?      Ordered  ?  ondansetron (ZOFRAN-ODT) 4 MG disintegrating tablet  Every 8 hours PRN       ? 01/27/22 0945  ? ?  ?  ? ?  ? ? ?  ?Jacalyn Lefevre, MD ?01/27/22 [phone] ? ?

## 2022-01-27 NOTE — ED Triage Notes (Signed)
Pt states she has been having dizziness and migraine headaches off and on now for the past 2 to 3 weeks  Pt adds also she has times when she feels like she is going to pass out  Pt states she has had nausea   Pt has unsteady gait walking to triage  Brought to room in wheelchair   ?

## 2022-04-15 ENCOUNTER — Emergency Department (HOSPITAL_BASED_OUTPATIENT_CLINIC_OR_DEPARTMENT_OTHER)
Admission: EM | Admit: 2022-04-15 | Discharge: 2022-04-16 | Disposition: A | Discharge status: Home / Self Care | Payer: No Typology Code available for payment source | Attending: Emergency Medicine | Admitting: Emergency Medicine

## 2022-04-15 ENCOUNTER — Encounter (HOSPITAL_BASED_OUTPATIENT_CLINIC_OR_DEPARTMENT_OTHER): Payer: Self-pay | Admitting: Urology

## 2022-04-15 ENCOUNTER — Other Ambulatory Visit: Payer: Self-pay

## 2022-04-15 DIAGNOSIS — G43009 Migraine without aura, not intractable, without status migrainosus: Secondary | ICD-10-CM | POA: Insufficient documentation

## 2022-04-15 LAB — HCG, SERUM, QUALITATIVE: Preg, Serum: NEGATIVE

## 2022-04-15 MED ORDER — KETOROLAC TROMETHAMINE 30 MG/ML IJ SOLN
30.0000 mg | Freq: Once | INTRAMUSCULAR | Status: AC
  Administered 2022-04-16: 30 mg via INTRAVENOUS
  Filled 2022-04-15: qty 1

## 2022-04-15 MED ORDER — ONDANSETRON HCL 4 MG/2ML IJ SOLN
4.0000 mg | Freq: Once | INTRAMUSCULAR | Status: AC
  Administered 2022-04-15: 4 mg via INTRAVENOUS
  Filled 2022-04-15: qty 2

## 2022-04-15 MED ORDER — MAGNESIUM SULFATE IN D5W 1-5 GM/100ML-% IV SOLN
1.0000 g | Freq: Once | INTRAVENOUS | Status: AC
  Administered 2022-04-15: 1 g via INTRAVENOUS
  Filled 2022-04-15: qty 100

## 2022-04-15 MED ORDER — SODIUM CHLORIDE 0.9 % IV BOLUS
500.0000 mL | Freq: Once | INTRAVENOUS | Status: AC
  Administered 2022-04-15: 500 mL via INTRAVENOUS

## 2022-04-15 NOTE — ED Notes (Signed)
Pt is dizzy, very nauseous, has a steady gait. Ambulated to the bathroom without assistance

## 2022-04-15 NOTE — ED Triage Notes (Signed)
Pt states massive HA that started today at 1400, reports N/V  H/o migraines  Sensitivity to Light and sound

## 2022-04-16 NOTE — ED Notes (Signed)
Pt A&OX4 ambulatory at d/c with independent steady gait, NAD. Pt verbalized understanding of d/c instructions and follow up care. ?

## 2022-04-16 NOTE — Discharge Instructions (Signed)

## 2022-04-16 NOTE — ED Provider Notes (Signed)
Emergency Department Provider Note   I have reviewed the triage vital signs and the nursing notes.   HISTORY  Chief Complaint Migraine   HPI Grace Bishop is a 36 y.o. female past medical history reviewed below including lupus and migraine headaches presents emergency department breakthrough migraine not responding to therapy at home.  Patient had a gradually worsening migraine type headache starting at 2 PM today.  She has had nausea along with vomiting.  She has been dancing sensitivity to both light and sound.  All of these features are typical of her migraine although not responding to home medications.  She has not had fever.  No head trauma.  No shortness of breath or chest discomfort.   Past Medical History:  Diagnosis Date   Anxiety    Lupus (HCC)    joint pain   Migraines    Ovarian cyst    Stomach problems    Thyroid disease     Review of Systems  Constitutional: No fever/chills Eyes: No visual changes. Positive photophobia.  ENT: No sore throat. Cardiovascular: Denies chest pain. Respiratory: Denies shortness of breath. Gastrointestinal: No abdominal pain.  Positive nausea and vomiting.  No diarrhea.  No constipation. Genitourinary: Negative for dysuria. Musculoskeletal: Negative for back pain. Skin: Negative for rash. Neurological: Negative for focal weakness or numbness. Positive HA.   ____________________________________________   PHYSICAL EXAM:  VITAL SIGNS: ED Triage Vitals  Enc Vitals Group     BP 04/15/22 2115 (!) 146/106     Pulse Rate 04/15/22 2115 94     Resp 04/15/22 2115 20     Temp 04/15/22 2115 98.9 F (37.2 C)     Temp Source 04/15/22 2115 Oral     SpO2 04/15/22 2115 100 %     Weight 04/15/22 2113 110 lb (49.9 kg)     Height 04/15/22 2113 5\' 2"  (1.575 m)   Constitutional: Alert and oriented. Well appearing and in no acute distress. Eyes: Conjunctivae are normal. Positive photophobia. PERRL.  Head: Atraumatic. Nose: No  congestion/rhinnorhea. Mouth/Throat: Mucous membranes are moist.  Neck: No stridor.  No meningeal signs. Cardiovascular: Normal rate, regular rhythm. Good peripheral circulation. Grossly normal heart sounds.   Respiratory: Normal respiratory effort.  No retractions. Lungs CTAB. Gastrointestinal: Soft and nontender. No distention.  Musculoskeletal: No lower extremity tenderness nor edema. No gross deformities of extremities. Neurologic:  Normal speech and language. No gross focal neurologic deficits are appreciated.  Skin:  Skin is warm, dry and intact. No rash noted.  ____________________________________________   LABS (all labs ordered are listed, but only abnormal results are displayed)  Labs Reviewed  HCG, SERUM, QUALITATIVE    ____________________________________________   PROCEDURES  Procedure(s) performed:   Procedures  None  ____________________________________________   INITIAL IMPRESSION / ASSESSMENT AND PLAN / ED COURSE  Pertinent labs & imaging results that were available during my care of the patient were reviewed by me and considered in my medical decision making (see chart for details).   This patient is Presenting for Evaluation of HA, which does require a range of treatment options, and is a complaint that involves a high risk of morbidity and mortality.  The Differential Diagnoses includes but is not exclusive to subarachnoid hemorrhage, meningitis, encephalitis, previous head trauma, cavernous venous thrombosis, muscle tension headache, glaucoma, temporal arteritis, migraine or migraine equivalent, etc.   Critical Interventions-    Medications  sodium chloride 0.9 % bolus 500 mL (0 mLs Intravenous Stopped 04/16/22 0025)  ondansetron (ZOFRAN) injection  4 mg (4 mg Intravenous Given 04/15/22 2320)  magnesium sulfate IVPB 1 g 100 mL (0 g Intravenous Stopped 04/16/22 0014)  ketorolac (TORADOL) 30 MG/ML injection 30 mg (30 mg Intravenous Given 04/16/22 0016)     Reassessment after intervention: Patient's HA and nausea improved.     Clinical Laboratory Tests Ordered, included pregnancy negative.   Radiologic Tests: Considered CT imaging of the head but patient had no sudden onset/maximal intensity headache symptoms.  Her headache is typical of prior migraines in the past.  No concern for infectious etiology.  Defer imaging for now.   Social Determinants of Health Risk patient is not an active smoker.   Medical Decision Making: Summary:  Patient presents emergency department with headache typical of her migraine although not responding to home medications.  She has no focal neurodeficits.  Pregnancy negative.  After treatment in the emergency department she is feeling improved and ready for discharge.  Reevaluation with update and discussion with patient. Discussed follow up plan and ED return precautions.   Considered admission but patient responding well to treatment.  No atypical headache features or red flag signs/symptoms.  Stable for discharge at this time.   Disposition: discharge  ____________________________________________  FINAL CLINICAL IMPRESSION(S) / ED DIAGNOSES  Final diagnoses:  Migraine without aura and without status migrainosus, not intractable     Note:  This document was prepared using Dragon voice recognition software and may include unintentional dictation errors.  Alona Bene, MD, [facility] Emergency Medicine    Koury Roddy, Arlyss Repress, MD 04/16/22 [phone]

## 2022-09-10 ENCOUNTER — Emergency Department (HOSPITAL_BASED_OUTPATIENT_CLINIC_OR_DEPARTMENT_OTHER)
Admission: EM | Admit: 2022-09-10 | Discharge: 2022-09-10 | Disposition: A | Discharge status: Home / Self Care | Payer: No Typology Code available for payment source | Attending: Emergency Medicine | Admitting: Emergency Medicine

## 2022-09-10 ENCOUNTER — Other Ambulatory Visit: Payer: Self-pay

## 2022-09-10 ENCOUNTER — Other Ambulatory Visit (HOSPITAL_BASED_OUTPATIENT_CLINIC_OR_DEPARTMENT_OTHER): Payer: Self-pay

## 2022-09-10 ENCOUNTER — Emergency Department (HOSPITAL_BASED_OUTPATIENT_CLINIC_OR_DEPARTMENT_OTHER): Payer: No Typology Code available for payment source

## 2022-09-10 ENCOUNTER — Encounter (HOSPITAL_BASED_OUTPATIENT_CLINIC_OR_DEPARTMENT_OTHER): Payer: Self-pay | Admitting: Emergency Medicine

## 2022-09-10 DIAGNOSIS — G43919 Migraine, unspecified, intractable, without status migrainosus: Secondary | ICD-10-CM | POA: Diagnosis not present

## 2022-09-10 DIAGNOSIS — R4182 Altered mental status, unspecified: Secondary | ICD-10-CM | POA: Diagnosis present

## 2022-09-10 LAB — CBG MONITORING, ED: Glucose-Capillary: 98 mg/dL (ref 70–99)

## 2022-09-10 MED ORDER — KETOROLAC TROMETHAMINE 15 MG/ML IJ SOLN
15.0000 mg | Freq: Once | INTRAMUSCULAR | Status: AC
  Administered 2022-09-10: 15 mg via INTRAVENOUS
  Filled 2022-09-10: qty 1

## 2022-09-10 MED ORDER — CYCLOBENZAPRINE HCL 10 MG PO TABS: 10.0000 mg | ORAL_TABLET | Freq: Three times a day (TID) | ORAL | 0 refills | Status: DC | PRN

## 2022-09-10 MED ORDER — LACTATED RINGERS IV BOLUS
1000.0000 mL | Freq: Once | INTRAVENOUS | Status: AC
  Administered 2022-09-10: 1000 mL via INTRAVENOUS

## 2022-09-10 MED ORDER — ONDANSETRON HCL 4 MG/2ML IJ SOLN
4.0000 mg | Freq: Once | INTRAMUSCULAR | Status: AC
  Administered 2022-09-10: 4 mg via INTRAVENOUS
  Filled 2022-09-10: qty 2

## 2022-09-10 MED ORDER — MAGNESIUM SULFATE IN D5W 1-5 GM/100ML-% IV SOLN
1.0000 g | Freq: Once | INTRAVENOUS | Status: AC
  Administered 2022-09-10: 1 g via INTRAVENOUS
  Filled 2022-09-10: qty 100

## 2022-09-10 MED ORDER — CYCLOBENZAPRINE HCL 10 MG PO TABS
10.0000 mg | ORAL_TABLET | Freq: Three times a day (TID) | ORAL | 0 refills | Status: AC | PRN
  Filled 2022-09-10: qty 20, 7d supply, fill #0

## 2022-09-10 NOTE — ED Triage Notes (Addendum)
Pt POV   Pt to triage with delayed, stuttering speech.  C/o migraine starting today. Pt unable to answer triage questions at this time.   Pt to rm 14, EDP Plunkett made aware.

## 2022-09-10 NOTE — ED Provider Notes (Signed)
[facility] EMERGENCY DEPARTMENT Provider Note   CSN: [id number] Arrival date & time: 09/10/22  1314     History  Chief Complaint  Patient presents with   Altered Mental Status    Grace Bishop is a 36 y.o. female.  Patient is a 36 year old female with a history of lupus and migraines who is presenting today with complaint of a headache that started yesterday, nausea without vomiting, right-sided weakness and speech problems.  Patient was able to drive herself to the emergency room but medication she typically uses at home were not working.  She reports she has had headaches like this in the past.  She did see her rheumatologist yesterday and had blood work drawn that looked at its baseline.  She denies having fever or neck pain.  The headache is mostly on the right side of her head.  Last menses was 3 weeks ago.  The history is provided by the patient and medical records.  Altered Mental Status      Home Medications Prior to Admission medications   Medication Sig Start Date End Date Taking? Authorizing Provider  cyclobenzaprine (FLEXERIL) 10 MG tablet Take 1 tablet (10 mg total) by mouth 3 (three) times daily as needed for muscle spasms. 09/10/22   Blanchie Dessert, MD  famotidine (PEPCID) 10 MG tablet Take 10 mg by mouth 2 (two) times daily.    [provider]  hydroxychloroquine (PLAQUENIL) 200 MG tablet Take by mouth daily.    [provider]  ondansetron (ZOFRAN) 8 MG tablet Take 8 mg by mouth every 8 (eight) hours as needed for nausea or vomiting.    [provider]  predniSONE (DELTASONE) 5 MG tablet Take 5 mg by mouth daily with breakfast.    [provider]      Allergies    Dexamethasone, Metoclopramide, Fexofenadine, and Prochlorperazine    Review of Systems   Review of Systems  Physical Exam Updated Vital Signs BP (!) 134/96   Pulse 86   Temp 98.5 F (36.9 C)   Resp 16   Ht 5\' 2"  (1.575 m)   Wt 52.2 kg   LMP  08/20/2022 (Exact Date)   SpO2 100%   BMI 21.03 kg/m  Physical Exam Vitals and nursing note reviewed.  Constitutional:      General: She is in acute distress.     Appearance: She is well-developed.  HENT:     Head: Normocephalic and atraumatic.  Eyes:     Pupils: Pupils are equal, round, and reactive to light.  Cardiovascular:     Rate and Rhythm: Normal rate and regular rhythm.     Heart sounds: Normal heart sounds. No murmur heard.    No friction rub.  Pulmonary:     Effort: Pulmonary effort is normal.     Breath sounds: Normal breath sounds. No wheezing or rales.  Abdominal:     General: Bowel sounds are normal. There is no distension.     Palpations: Abdomen is soft.     Tenderness: There is no abdominal tenderness. There is no guarding or rebound.  Musculoskeletal:        General: No tenderness. Normal range of motion.     Comments: No edema  Skin:    General: Skin is warm and dry.     Findings: No rash.  Neurological:     Mental Status: She is alert and oriented to person, place, and time.     Cranial Nerves: No cranial nerve deficit.  Comments: Mild generalized weakness but 5 out of 5 strength in upper and lower extremities.  No pronator drift is noted.  Patient's speech is delayed.  She only speaks 1 word at a time and it is spaced out but there is no specific aphasia or word salad     ED Results / Procedures / Treatments   Labs (all labs ordered are listed, but only abnormal results are displayed) Labs Reviewed  CBG MONITORING, ED    EKG None  Radiology CT Head Wo Contrast  Result Date: 09/10/2022 CLINICAL DATA:  Headaches EXAM: CT HEAD WITHOUT CONTRAST TECHNIQUE: Contiguous axial images were obtained from the base of the skull through the vertex without intravenous contrast. RADIATION DOSE REDUCTION: This exam was performed according to the departmental dose-optimization program which includes automated exposure control, adjustment of the mA and/or kV  according to patient size and/or use of iterative reconstruction technique. COMPARISON:  12/24/2016 FINDINGS: Brain: No acute intracranial findings are seen. There are no signs of bleeding within the cranium. There is no focal edema or mass effect. Cortical sulci are prominent. Vascular: Unremarkable. Skull: Unremarkable. Sinuses/Orbits: Unremarkable. Other: There are scattered coarse calcifications in both parotid glands which was also seen in the previous study, possibly suggesting chronic inflammation. IMPRESSION: No acute intracranial findings are seen in noncontrast CT brain. There are multiple calcifications in both parotid glands, possibly suggesting chronic inflammation. Electronically Signed   By: Elmer Picker M.D.   On: 09/10/2022 15:04    Procedures Procedures    Medications Ordered in ED Medications  lactated ringers bolus 1,000 mL (0 mLs Intravenous Stopped 09/10/22 1528)  ketorolac (TORADOL) 15 MG/ML injection 15 mg (15 mg Intravenous Given 09/10/22 1403)  magnesium sulfate IVPB 1 g 100 mL (0 g Intravenous Stopped 09/10/22 1528)  ondansetron (ZOFRAN) injection 4 mg (4 mg Intravenous Given 09/10/22 1403)    ED Course/ Medical Decision Making/ A&P                           Medical Decision Making Amount and/or Complexity of Data Reviewed External Data Reviewed: notes.    Details: Rheum notes Radiology: ordered and independent interpretation performed. Decision-making details documented in ED Course.  Risk Prescription drug management.   Pt with multiple medical problems and comorbidities and presenting today with a complaint that caries a high risk for morbidity and mortality.Pt with typical migraine HA without sx suggestive of SAH(sudden onset, worst of life, or deficits), infection, or cavernous vein thrombosis.  Pt does have speech delay and c/o of weakness but no focal findings found.  Initially HTN but repeat was improved. Will give HA cocktail and CT and  re-evaluate.  3:30 PM I have independently visualized and interpreted pt's images today. Head CT was negative for acute intercranial hemorrhage.  Radiology reports chronic parotid calcifications but no other acute findings.  On repeat evaluation patient reports her headache is better.  Patient's speech is also improved some.  Also witnessed speaking with her she reports that yesterday during her rheumatology appointment she got bad news that they do not want her to get pregnant because they feel that she needs to be healthier first.  That has been very emotionally devastating for her as well.  She reports her headache started after getting back from her appointment.  She reports she has had all of the speech stuff before with her headaches.  At this time I have a very low suspicion that she is  having stroke.  She does have oral Reglan at home that she takes for her headaches but reports she normally uses baclofen and she ran out of that.  We will give a prescription for flexeril.  Patient is comfortable with this plan and would like to go home.  This time do not feel that she indicates need for emergent MRI.  Repeat blood pressure here is normal at 123/88.          Final Clinical Impression(s) / ED Diagnoses Final diagnoses:  Complicated migraine, intractable    Rx / DC Orders ED Discharge Orders          Ordered    cyclobenzaprine (FLEXERIL) 10 MG tablet  3 times daily PRN        09/10/22 1530              Blanchie Dessert, MD 09/10/22 1530

## 2022-09-10 NOTE — Discharge Instructions (Addendum)
If you start running fever or your symptoms start getting worse such as losing your vision, vomiting, inability to use one side of your body return to the emergency room immediately.  It is okay for you to take the Reglan at home and you were given a refill of your muscle relaxer.

## 2022-09-10 NOTE — ED Notes (Signed)
D/c paperwork reviewed with pt, including prescription.  No questions or concerns at time of d/c. Pt ambulatory to ED exit without assistance.

## 2022-09-21 ENCOUNTER — Other Ambulatory Visit (HOSPITAL_BASED_OUTPATIENT_CLINIC_OR_DEPARTMENT_OTHER): Payer: Self-pay

## 2022-12-24 ENCOUNTER — Encounter (HOSPITAL_BASED_OUTPATIENT_CLINIC_OR_DEPARTMENT_OTHER): Payer: Self-pay

## 2022-12-24 ENCOUNTER — Inpatient Hospital Stay (HOSPITAL_BASED_OUTPATIENT_CLINIC_OR_DEPARTMENT_OTHER)
Admission: EM | Admit: 2022-12-24 | Discharge: 2022-12-27 | DRG: 866 | Disposition: A | Discharge status: Home / Self Care | Payer: No Typology Code available for payment source | Attending: Internal Medicine | Admitting: Internal Medicine

## 2022-12-24 ENCOUNTER — Other Ambulatory Visit: Payer: Self-pay

## 2022-12-24 DIAGNOSIS — Z79899 Other long term (current) drug therapy: Secondary | ICD-10-CM | POA: Diagnosis not present

## 2022-12-24 DIAGNOSIS — R748 Abnormal levels of other serum enzymes: Secondary | ICD-10-CM | POA: Diagnosis present

## 2022-12-24 DIAGNOSIS — M329 Systemic lupus erythematosus, unspecified: Secondary | ICD-10-CM | POA: Diagnosis present

## 2022-12-24 DIAGNOSIS — F411 Generalized anxiety disorder: Secondary | ICD-10-CM | POA: Diagnosis present

## 2022-12-24 DIAGNOSIS — Z7952 Long term (current) use of systemic steroids: Secondary | ICD-10-CM | POA: Diagnosis not present

## 2022-12-24 DIAGNOSIS — Z888 Allergy status to other drugs, medicaments and biological substances status: Secondary | ICD-10-CM | POA: Diagnosis not present

## 2022-12-24 DIAGNOSIS — E876 Hypokalemia: Secondary | ICD-10-CM | POA: Diagnosis present

## 2022-12-24 DIAGNOSIS — Z87891 Personal history of nicotine dependence: Secondary | ICD-10-CM | POA: Diagnosis not present

## 2022-12-24 DIAGNOSIS — D649 Anemia, unspecified: Secondary | ICD-10-CM | POA: Diagnosis present

## 2022-12-24 DIAGNOSIS — B029 Zoster without complications: Secondary | ICD-10-CM | POA: Diagnosis present

## 2022-12-24 DIAGNOSIS — D72819 Decreased white blood cell count, unspecified: Secondary | ICD-10-CM | POA: Diagnosis present

## 2022-12-24 DIAGNOSIS — E871 Hypo-osmolality and hyponatremia: Secondary | ICD-10-CM | POA: Diagnosis present

## 2022-12-24 DIAGNOSIS — R000 Tachycardia, unspecified: Secondary | ICD-10-CM | POA: Diagnosis present

## 2022-12-24 DIAGNOSIS — Z8349 Family history of other endocrine, nutritional and metabolic diseases: Secondary | ICD-10-CM | POA: Diagnosis not present

## 2022-12-24 DIAGNOSIS — Z832 Family history of diseases of the blood and blood-forming organs and certain disorders involving the immune mechanism: Secondary | ICD-10-CM

## 2022-12-24 DIAGNOSIS — F419 Anxiety disorder, unspecified: Secondary | ICD-10-CM | POA: Diagnosis present

## 2022-12-24 DIAGNOSIS — Z813 Family history of other psychoactive substance abuse and dependence: Secondary | ICD-10-CM

## 2022-12-24 DIAGNOSIS — R5381 Other malaise: Secondary | ICD-10-CM | POA: Diagnosis present

## 2022-12-24 DIAGNOSIS — B027 Disseminated zoster: Secondary | ICD-10-CM | POA: Diagnosis not present

## 2022-12-24 DIAGNOSIS — D638 Anemia in other chronic diseases classified elsewhere: Secondary | ICD-10-CM | POA: Diagnosis present

## 2022-12-24 LAB — CBC WITH DIFFERENTIAL/PLATELET
Abs Immature Granulocytes: 0.02 10*3/uL (ref 0.00–0.07)
Basophils Absolute: 0 10*3/uL (ref 0.0–0.1)
Basophils Relative: 0 %
Eosinophils Absolute: 0 10*3/uL (ref 0.0–0.5)
Eosinophils Relative: 0 %
HCT: 35.9 % — ABNORMAL LOW (ref 36.0–46.0)
Hemoglobin: 11.5 g/dL — ABNORMAL LOW (ref 12.0–15.0)
Immature Granulocytes: 1 %
Lymphocytes Relative: 5 %
Lymphs Abs: 0.2 10*3/uL — ABNORMAL LOW (ref 0.7–4.0)
MCH: 26 pg (ref 26.0–34.0)
MCHC: 32 g/dL (ref 30.0–36.0)
MCV: 81 fL (ref 80.0–100.0)
Monocytes Absolute: 0.6 10*3/uL (ref 0.1–1.0)
Monocytes Relative: 15 %
Neutro Abs: 2.9 10*3/uL (ref 1.7–7.7)
Neutrophils Relative %: 79 %
Platelets: 197 10*3/uL (ref 150–400)
RBC: 4.43 MIL/uL (ref 3.87–5.11)
RDW: 12.9 % (ref 11.5–15.5)
WBC: 3.7 10*3/uL — ABNORMAL LOW (ref 4.0–10.5)
nRBC: 0 % (ref 0.0–0.2)

## 2022-12-24 LAB — COMPREHENSIVE METABOLIC PANEL
ALT: 71 U/L — ABNORMAL HIGH (ref 0–44)
AST: 60 U/L — ABNORMAL HIGH (ref 15–41)
Albumin: 3.2 g/dL — ABNORMAL LOW (ref 3.5–5.0)
Alkaline Phosphatase: 55 U/L (ref 38–126)
Anion gap: 4 — ABNORMAL LOW (ref 5–15)
BUN: 12 mg/dL (ref 6–20)
CO2: 21 mmol/L — ABNORMAL LOW (ref 22–32)
Calcium: 8.8 mg/dL — ABNORMAL LOW (ref 8.9–10.3)
Chloride: 104 mmol/L (ref 98–111)
Creatinine, Ser: 0.73 mg/dL (ref 0.44–1.00)
GFR, Estimated: >60 mL/min (ref >60)
Glucose, Bld: 128 mg/dL — ABNORMAL HIGH (ref 70–99)
Potassium: 3.4 mmol/L — ABNORMAL LOW (ref 3.5–5.1)
Sodium: 129 mmol/L — ABNORMAL LOW (ref 135–145)
Total Bilirubin: 0.4 mg/dL (ref 0.3–1.2)
Total Protein: 7.1 g/dL (ref 6.5–8.1)

## 2022-12-24 LAB — MAGNESIUM: Magnesium: 1.7 mg/dL (ref 1.7–2.4)

## 2022-12-24 LAB — PHOSPHORUS: Phosphorus: 3.4 mg/dL (ref 2.5–4.6)

## 2022-12-24 LAB — HIV ANTIBODY (ROUTINE TESTING W REFLEX): HIV Screen 4th Generation wRfx: NONREACTIVE

## 2022-12-24 MED ORDER — ONDANSETRON HCL 4 MG/2ML IJ SOLN: 4.0000 mg | Freq: Four times a day (QID) | INTRAMUSCULAR | Status: DC | PRN

## 2022-12-24 MED ORDER — ACETAMINOPHEN 650 MG RE SUPP: 650.0000 mg | Freq: Four times a day (QID) | RECTAL | Status: DC | PRN

## 2022-12-24 MED ORDER — OXYCODONE HCL 5 MG PO TABS
5.0000 mg | ORAL_TABLET | Freq: Once | ORAL | Status: AC
  Administered 2022-12-24: 5 mg via ORAL
  Filled 2022-12-24: qty 1

## 2022-12-24 MED ORDER — MAGNESIUM SULFATE 2 GM/50ML IV SOLN
2.0000 g | Freq: Once | INTRAVENOUS | Status: AC
  Administered 2022-12-24: 2 g via INTRAVENOUS
  Filled 2022-12-24: qty 50

## 2022-12-24 MED ORDER — PREDNISONE 20 MG PO TABS
20.0000 mg | ORAL_TABLET | Freq: Every day | ORAL | Status: DC
  Filled 2022-12-24: qty 1

## 2022-12-24 MED ORDER — HYDROXYZINE HCL 25 MG PO TABS
25.0000 mg | ORAL_TABLET | Freq: Four times a day (QID) | ORAL | Status: DC | PRN
  Administered 2022-12-24 – 2022-12-25 (×3): 25 mg via ORAL
  Filled 2022-12-24 (×4): qty 1

## 2022-12-24 MED ORDER — POTASSIUM CHLORIDE CRYS ER 20 MEQ PO TBCR
40.0000 meq | EXTENDED_RELEASE_TABLET | Freq: Once | ORAL | Status: AC
  Administered 2022-12-24: 40 meq via ORAL
  Filled 2022-12-24: qty 2

## 2022-12-24 MED ORDER — OXYCODONE HCL 5 MG PO TABS: 2.5000 mg | ORAL_TABLET | ORAL | Status: DC | PRN

## 2022-12-24 MED ORDER — SODIUM CHLORIDE 0.9 % IV SOLN: INTRAVENOUS | Status: DC

## 2022-12-24 MED ORDER — ONDANSETRON HCL 4 MG/2ML IJ SOLN
4.0000 mg | Freq: Once | INTRAMUSCULAR | Status: AC
  Administered 2022-12-24: 4 mg via INTRAVENOUS
  Filled 2022-12-24: qty 2

## 2022-12-24 MED ORDER — ACYCLOVIR SODIUM 50 MG/ML IV SOLN
INTRAVENOUS | Status: AC
  Filled 2022-12-24: qty 10

## 2022-12-24 MED ORDER — ENOXAPARIN SODIUM 40 MG/0.4ML IJ SOSY
40.0000 mg | PREFILLED_SYRINGE | INTRAMUSCULAR | Status: DC
  Filled 2022-12-24 (×2): qty 0.4

## 2022-12-24 MED ORDER — ONDANSETRON HCL 4 MG PO TABS: 4.0000 mg | ORAL_TABLET | Freq: Four times a day (QID) | ORAL | Status: DC | PRN

## 2022-12-24 MED ORDER — PREDNISONE 5 MG PO TABS
5.0000 mg | ORAL_TABLET | Freq: Every day | ORAL | Status: DC
  Administered 2022-12-25: 5 mg via ORAL
  Filled 2022-12-24: qty 1

## 2022-12-24 MED ORDER — SODIUM CHLORIDE 0.9 % IV BOLUS
1000.0000 mL | Freq: Once | INTRAVENOUS | Status: AC
  Administered 2022-12-24: 1000 mL via INTRAVENOUS

## 2022-12-24 MED ORDER — DEXTROSE 5 % IV SOLN
10.0000 mg/kg | Freq: Once | INTRAVENOUS | Status: AC
  Administered 2022-12-24: 530 mg via INTRAVENOUS
  Filled 2022-12-24: qty 10.6

## 2022-12-24 MED ORDER — ACETAMINOPHEN 325 MG PO TABS
650.0000 mg | ORAL_TABLET | Freq: Four times a day (QID) | ORAL | Status: DC | PRN
  Administered 2022-12-25 – 2022-12-27 (×4): 650 mg via ORAL
  Filled 2022-12-24 (×4): qty 2

## 2022-12-24 MED ORDER — DIPHENHYDRAMINE HCL 50 MG/ML IJ SOLN
25.0000 mg | Freq: Once | INTRAMUSCULAR | Status: AC
  Administered 2022-12-24: 25 mg via INTRAVENOUS
  Filled 2022-12-24: qty 1

## 2022-12-24 MED ORDER — FAMOTIDINE 20 MG PO TABS
10.0000 mg | ORAL_TABLET | Freq: Two times a day (BID) | ORAL | Status: DC
  Administered 2022-12-24 – 2022-12-27 (×6): 10 mg via ORAL
  Filled 2022-12-24 (×6): qty 1

## 2022-12-24 MED ORDER — DEXTROSE 5 % IV SOLN
10.0000 mg/kg | Freq: Three times a day (TID) | INTRAVENOUS | Status: DC
  Administered 2022-12-24 – 2022-12-27 (×8): 530 mg via INTRAVENOUS
  Filled 2022-12-24 (×5): qty 10.6
  Filled 2022-12-24 (×2): qty 10
  Filled 2022-12-24 (×2): qty 10.6
  Filled 2022-12-24: qty 10
  Filled 2022-12-24: qty 10.6

## 2022-12-24 MED ORDER — PREDNISONE 5 MG PO TABS: 10.0000 mg | ORAL_TABLET | Freq: Every day | ORAL | Status: DC

## 2022-12-24 MED ORDER — PREDNISONE 5 MG PO TABS: 5.0000 mg | ORAL_TABLET | Freq: Every day | ORAL | Status: DC

## 2022-12-24 NOTE — ED Triage Notes (Signed)
Pt presents with complaint of rash. Has not voided x 3 days. Questionable shingles. Pt has rash to face, scattered filled blisters on back. Open blisters to left gluteal fold

## 2022-12-24 NOTE — ED Notes (Signed)
Lab reports lactic acid machine is not in working order at this time. EDP notified

## 2022-12-24 NOTE — H&P (Signed)
History and Physical    Patient: Grace Bishop TDV:[id number] DOB: 1986/11/05 DOA: 12/24/2022 DOS: the patient was seen and examined on 12/24/2022 PCP: Leotis Pain, MD  Patient coming from: Home  Chief Complaint:  Chief Complaint  Patient presents with   Rash   HPI: Grace Bishop is a 37 y.o. female with medical history significant of anxiety, SLE, arthralgias, migraine headaches, ovarian cyst, hyperthyroidism, leukopenia, iron deficiency anemia, gallstones, tachycardia who presented to emergency department with a painful vesicular rash that started on her left gluteal area and has spread to her genitalia.  She also has lesions in her mouth, abdomen, back and chest.  The pain is worse on her left sided lower back and genitalia area.  No sick contacts.  She has been having fevers associated with chills, fatigue and malaise. She has been having trouble urinating, no flank pain, dysuria, frequency or hematuria.no rhinorrhea, sore throat, wheezing or hemoptysis.  No chest pain, palpitations, diaphoresis, PND, orthopnea or pitting edema of the lower extremities. No abdominal pain, nausea, emesis, diarrhea, constipation, melena or hematochezia. No polyuria, polydipsia, polyphagia or blurred vision.   Lab work: CBC showed a white count 3.7, hemoglobin 11.5 g/dL platelets 197.  Magnesium is 1.7 phosphorus 3.4 mg/dL.  Sodium 129, potassium 3.4, chloride 104 and CO2 21 mmol/L.  Anion gap was 4.  Normal renal function glucose 128 mg/dL.  Calcium is normal after correction to albumin.  LFTs with an AST of 60 and ALT of 71.  Albumin was 3.2 g/dL.  The rest of the LFTs were normal.  ED course: Initial vital signs were temperature 98.6 F, pulse 116, respiration 20, BP 134/108 mmHg O2 sat 100% on room air.  The patient received 530 mg of acyclovir IVPB, Benadryl 25 mg IVP, ondansetron 4 mg IVP, NS 1000 mL bolus and I added KCl 40 mill equivalents p.o. x 1.   Review of Systems: As mentioned in the history of  present illness. All other systems reviewed and are negative. Past Medical History:  Diagnosis Date   Anxiety    Lupus ([place])    joint pain   Migraines    Ovarian cyst    Stomach problems    Thyroid disease    Past Surgical History:  Procedure Laterality Date   OVARIAN CYST REMOVAL     TONSILLECTOMY     Social History:  reports that she has quit smoking. Her smoking use included cigarettes. She has never used smokeless tobacco. She reports that she does not drink alcohol and does not use drugs.  Allergies  Allergen Reactions   Dexamethasone Anxiety and Other (See Comments)   Metoclopramide Other (See Comments)    IV only  "teeth clench down"  Liquid only "teeth clench down".  Able to take pill form.   Liquid only "teeth clench down".  Able to take pill form.   "teeth clench down"  "teeth clench down"  Liquid only "teeth clench down".  Able to take pill form.   "teeth clench down"  Liquid only "teeth clench down".  Able to take pill form.   IV only   Fexofenadine Other (See Comments)    Other reaction(s): Tremor (intolerance)   Prochlorperazine Anxiety, Rash and Other (See Comments)    Causes lock jaw  Causes lock jaw  Causes lock jaw    Family History  Problem Relation Age of Onset   Thyroid disease Mother    Drug abuse Father    Sickle cell trait Sister  Prior to Admission medications   Medication Sig Start Date End Date Taking? Authorizing Provider  hydroxychloroquine (PLAQUENIL) 200 MG tablet Take by mouth daily.   Yes [provider]  predniSONE (DELTASONE) 5 MG tablet Take 5 mg by mouth daily with breakfast.   Yes [provider]  cyclobenzaprine (FLEXERIL) 10 MG tablet Take 1 tablet (10 mg total) by mouth 3 (three) times daily as needed for muscle spasms. 09/10/22   Blanchie Dessert, MD  famotidine (PEPCID) 10 MG tablet Take 10 mg by mouth 2 (two) times daily.    [provider]  ondansetron (ZOFRAN) 8 MG tablet Take 8 mg  by mouth every 8 (eight) hours as needed for nausea or vomiting.    [provider]    Physical Exam: Vitals:   12/24/22 1312 12/24/22 1330 12/24/22 1423 12/24/22 1554  BP:  (!) 153/109 (!) 141/101 (!) 158/102  Pulse: 92 (!) 51 71 83  Resp:   17 20  Temp: 98.5 F (36.9 C)   99 F (37.2 C)  TempSrc:    Oral  SpO2: 100% 100% 100% 100%  Weight:      Height:       Physical Exam Vitals and nursing note reviewed.  Constitutional:      Appearance: Normal appearance.  HENT:     Head: Normocephalic.     Nose: No rhinorrhea.     Mouth/Throat:     Mouth: Mucous membranes are moist.  Eyes:     General: No scleral icterus.    Pupils: Pupils are equal, round, and reactive to light.  Cardiovascular:     Rate and Rhythm: Normal rate.     Pulses: Normal pulses.     Heart sounds: Normal heart sounds.  Pulmonary:     Effort: Pulmonary effort is normal.     Breath sounds: Normal breath sounds.  Abdominal:     General: Bowel sounds are normal. There is no distension.     Palpations: Abdomen is soft.     Tenderness: There is no abdominal tenderness. There is no guarding.  Musculoskeletal:     Cervical back: Neck supple.     Right lower leg: No edema.     Left lower leg: No edema.  Skin:    General: Skin is warm and dry.     Findings: Erythema and rash present. Rash is crusting and vesicular.     Comments: Extensive disseminated vesicular rash with some crusting.  Neurological:     General: No focal deficit present.     Mental Status: She is alert and oriented to person, place, and time.  Psychiatric:        Mood and Affect: Mood normal.        Behavior: Behavior normal.   Data Reviewed:  Results are pending, will review when available.  Assessment and Plan: Principal Problem:   Herpes zoster Inpatient/MedSurg. Continue isolation measures. Continue acyclovir per pharmacy.   Continue IV hydration. Antihistamines as needed. Analgesics as needed.  Active  Problems:   Hypokalemia Replaced. Follow-up potassium level.    Hyponatremia Poor oral intake? Continue IV fluids. Follow-up sodium level.    GAD (generalized anxiety disorder) May use hydroxyzine as needed for anxiety.    Leukopenia In the setting of SLE. Monitor WBC.    Normocytic anemia Monitor hematocrit and hemoglobin.    Systemic lupus erythematosus, unspecified ([place]) Continue physiological daily prednisone. Hold hydroxychloroquine 200 mg p.o. daily.    Advance Care Planning:   Code Status:  Full Code   Consults:   Family Communication:   Severity of Illness: The appropriate patient status for this patient is INPATIENT. Inpatient status is judged to be reasonable and necessary in order to provide the required intensity of service to ensure the patient's safety. The patient's presenting symptoms, physical exam findings, and initial radiographic and laboratory data in the context of their chronic comorbidities is felt to place them at high risk for further clinical deterioration. Furthermore, it is not anticipated that the patient will be medically stable for discharge from the hospital within 2 midnights of admission.   * I certify that at the point of admission it is my clinical judgment that the patient will require inpatient hospital care spanning beyond 2 midnights from the point of admission due to high intensity of service, high risk for further deterioration and high frequency of surveillance required.*  Author: Reubin Milan, MD 12/24/2022 4:22 PM  For on call review [email or web address].   This document was prepared using Dragon voice recognition software and may contain some unintended transcription errors.

## 2022-12-24 NOTE — ED Notes (Signed)
Pt ambulatory to [facility] with [facility].

## 2022-12-24 NOTE — ED Notes (Signed)
Called the [facility] line for transfer to [facility] at 11:50

## 2022-12-24 NOTE — ED Notes (Signed)
Called [facility] for Transport talked to [place] at 1:53

## 2022-12-24 NOTE — ED Notes (Signed)
Notified lab of mag and phosphorus order

## 2022-12-24 NOTE — Progress Notes (Signed)
Plan of Care Note for accepted transfer   Patient: Grace Bishop MRN: [id number]   DOA: 12/24/2022  Facility requesting transfer: [facility].  Requesting Provider: Deno Etienne, DO Reason for transfer: Disseminated herpes zoster. Facility course:  Per Dr. Tyrone Nine: "[name] with a chief complaints of rash.  This started on her left buttock and since then has spread throughout her body.  Tells me that it still worse on her back and has noticed some in her mouth and about her vagina.  She has had some difficulty voiding for the past 3 days.  Tells me that she cannot move her bowel or bladder.  She has had fevers at home as well, subjective and were not measured.  She denies sick contacts.  Denies cough or congestion.  Denies recent travel.   Patient does have lupus, she is currently on 10 mg of prednisone daily.  Continues to take Plaquenil."  Plan of care: The patient is accepted for admission to [place]  unit, at [facility].  She was started on acyclovir per pharmacy in the emergency department.  Author: Reubin Milan, MD 12/24/2022  Check [email or web address] for on-call coverage.  Nursing staff, Please call [place] number on Amion as soon as patient's arrival, so appropriate admitting provider can evaluate the pt.

## 2022-12-24 NOTE — ED Notes (Addendum)
edh     

## 2022-12-24 NOTE — ED Provider Notes (Signed)
[place] [facility] Provider Note   CSN: [id number] Arrival date & time: 12/24/22  0973     History  Chief Complaint  Patient presents with   Rash    Grace Bishop is a 37 y.o. female.  [name] with a chief complaints of rash.  This started on her left buttock and since then has spread throughout her body.  Tells me that it still worse on her back and has noticed some in her mouth and about her vagina.  She has had some difficulty voiding for the past 3 days.  Tells me that she cannot move her bowel or bladder.  She has had fevers at home as well, subjective and were not measured.  She denies sick contacts.  Denies cough or congestion.  Denies recent travel.  Patient does have lupus, she is currently on 10 mg of prednisone daily.  Continues to take Plaquenil.   Rash      Home Medications Prior to Admission medications   Medication Sig Start Date End Date Taking? Authorizing Provider  hydroxychloroquine (PLAQUENIL) 200 MG tablet Take by mouth daily.   Yes [provider]  predniSONE (DELTASONE) 5 MG tablet Take 5 mg by mouth daily with breakfast.   Yes [provider]  cyclobenzaprine (FLEXERIL) 10 MG tablet Take 1 tablet (10 mg total) by mouth 3 (three) times daily as needed for muscle spasms. 09/10/22   Blanchie Dessert, MD  famotidine (PEPCID) 10 MG tablet Take 10 mg by mouth 2 (two) times daily.    [provider]  ondansetron (ZOFRAN) 8 MG tablet Take 8 mg by mouth every 8 (eight) hours as needed for nausea or vomiting.    [provider]      Allergies    Dexamethasone, Metoclopramide, Fexofenadine, and Prochlorperazine    Review of Systems   Review of Systems  Skin:  Positive for rash.    Physical Exam Updated Vital Signs BP (!) 134/108   Pulse (!) 116   Temp 98.6 F (37 C)   Resp 20   Ht 5\' 2"  (1.575 m)   Wt 53.1 kg   SpO2 100%   BMI 21.40 kg/m  Physical Exam Vitals and nursing  note reviewed.  Constitutional:      General: She is not in acute distress.    Appearance: She is well-developed. She is not diaphoretic.  HENT:     Head: Normocephalic and atraumatic.  Eyes:     Pupils: Pupils are equal, round, and reactive to light.  Cardiovascular:     Rate and Rhythm: Normal rate and regular rhythm.     Heart sounds: No murmur heard.    No friction rub. No gallop.  Pulmonary:     Effort: Pulmonary effort is normal.     Breath sounds: No wheezing or rales.  Abdominal:     General: There is no distension.     Palpations: Abdomen is soft.     Tenderness: There is no abdominal tenderness.  Musculoskeletal:        General: No tenderness.     Cervical back: Normal range of motion and neck supple.  Skin:    General: Skin is warm and dry.     Comments: Diffuse vesicular appearing rash with different stages throughout the body.  Includes the palmar surface, along the buttock.  Neurological:     Mental Status: She is alert and oriented to person, place, and time.  Psychiatric:  Behavior: Behavior normal.     ED Results / Procedures / Treatments   Labs (all labs ordered are listed, but only abnormal results are displayed) Labs Reviewed  CBC WITH DIFFERENTIAL/PLATELET  COMPREHENSIVE METABOLIC PANEL  LACTIC ACID, PLASMA  RPR  HIV ANTIBODY (ROUTINE TESTING W REFLEX)  GC/CHLAMYDIA PROBE AMP ([place]) NOT AT [facility]    EKG None  Radiology No results found.  Procedures Procedures    Medications Ordered in ED Medications  sodium chloride 0.9 % bolus 1,000 mL (has no administration in time range)  ondansetron (ZOFRAN) injection 4 mg (has no administration in time range)  acyclovir (ZOVIRAX) 530 mg in dextrose 5 % 100 mL IVPB (has no administration in time range)  0.9 %  sodium chloride infusion (has no administration in time range)  acyclovir (ZOVIRAX) 50 MG/ML injection (has no administration in time range)    ED Course/ Medical Decision  Making/ A&P                             Medical Decision Making Amount and/or Complexity of Data Reviewed Labs: ordered.  Risk Prescription drug management. Decision regarding hospitalization.   [name] with a chief complaint of a rash.  This been going on for about 4 days now.  She had seen her family doctor at the onset of this illness, thought to be varicella and had started her on some antiviral medications.  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  I do think that the rash along the buttock looks typical of shingles.  She does not appear well and tells me that she is not urinated in 3 days, this makes me concerned for acute renal failure.  Will give a bolus of IV fluids started on antiviral medications.  Lab work without significant change from her baseline.  Renal function appears to be at baseline hyponatremia and hypochloremia consistent with likely dehydration.  Patient feeling a little bit better after an IV dose of acyclovir.  Will discuss with medicine for admission.  The patients results and plan were reviewed and discussed.   Any x-rays performed were independently reviewed by myself.   Differential diagnosis were considered with the presenting HPI.  Medications  0.9 %  sodium chloride infusion ( Intravenous New Bag/Given 12/24/22 1119)  acyclovir (ZOVIRAX) 50 MG/ML injection (  Not Given 12/24/22 1316)  acyclovir (ZOVIRAX) 530 mg in dextrose 5 % 100 mL IVPB (has no administration in time range)  sodium chloride 0.9 % bolus 1,000 mL (0 mLs Intravenous Stopped 12/24/22 1119)  ondansetron (ZOFRAN) injection 4 mg (4 mg Intravenous Given 12/24/22 1014)  acyclovir (ZOVIRAX) 530 mg in dextrose 5 % 100 mL IVPB (0 mg Intravenous Stopped 12/24/22 1119)  potassium chloride SA (KLOR-CON M) CR tablet 40 mEq (40 mEq Oral Given 12/24/22 1313)  diphenhydrAMINE (BENADRYL) injection 25 mg (25 mg Intravenous Given 12/24/22 1321)    Vitals:   12/24/22 1200 12/24/22 1312 12/24/22  1330 12/24/22 1423  BP: (!) 138/98  (!) 153/109 (!) 141/101  Pulse: 96 92 (!) 51 71  Resp: 18   17  Temp:  98.5 F (36.9 C)    SpO2: 100% 100% 100% 100%  Weight:      Height:        Final diagnoses:  Disseminated herpes zoster    Admission/ observation were discussed with the admitting physician, patient and/or family and they are comfortable with the plan.  Final Clinical Impression(s) / ED Diagnoses Final diagnoses:  None    Rx / DC Orders ED Discharge Orders     None         Deno Etienne, DO 12/24/22 1425

## 2022-12-24 NOTE — Progress Notes (Signed)
Pharmacy Antibiotic Note  Grace Bishop is a 37 y.o. female admitted on 12/24/2022 with  rash .  Pharmacy has been consulted for acyclovir dosing.Pt si afebrile and WBC is slightly low. Scr is WNL.   Plan: Acyclovir 10mg /kg IV Q8H NS at 161ml/hr F/u renal fxn, C&S, clinical status and LOT  Height: 5\' 2"  (157.5 cm) Weight: 53.1 kg (117 lb) IBW/kg (Calculated) : 50.1  Temp (24hrs), Avg:98.6 F (37 C), Min:98.6 F (37 C), Max:98.6 F (37 C)  Recent Labs  Lab 12/24/22 1015  WBC 3.7*  CREATININE 0.73    Estimated Creatinine Clearance: 76.2 mL/min (by C-G formula based on SCr of 0.73 mg/dL).    Allergies  Allergen Reactions   Dexamethasone Anxiety and Other (See Comments)   Metoclopramide Other (See Comments)    IV only  "teeth clench down"  Liquid only "teeth clench down".  Able to take pill form.   Liquid only "teeth clench down".  Able to take pill form.   "teeth clench down"  "teeth clench down"  Liquid only "teeth clench down".  Able to take pill form.   "teeth clench down"  Liquid only "teeth clench down".  Able to take pill form.   IV only   Fexofenadine Other (See Comments)    Other reaction(s): Tremor (intolerance)   Prochlorperazine Anxiety, Rash and Other (See Comments)    Causes lock jaw  Causes lock jaw  Causes lock jaw    Antimicrobials this admission: Acyclovir 2/8>>  Dose adjustments this admission: N/A  Microbiology results: Pending  Thank you for allowing pharmacy to be a part of this patient's care.  Zigmond Trela, Rande Lawman 12/24/2022 9:32 AM

## 2022-12-25 DIAGNOSIS — E876 Hypokalemia: Secondary | ICD-10-CM | POA: Diagnosis not present

## 2022-12-25 DIAGNOSIS — E871 Hypo-osmolality and hyponatremia: Secondary | ICD-10-CM | POA: Diagnosis not present

## 2022-12-25 DIAGNOSIS — M329 Systemic lupus erythematosus, unspecified: Secondary | ICD-10-CM

## 2022-12-25 DIAGNOSIS — F411 Generalized anxiety disorder: Secondary | ICD-10-CM

## 2022-12-25 DIAGNOSIS — B027 Disseminated zoster: Secondary | ICD-10-CM | POA: Diagnosis not present

## 2022-12-25 DIAGNOSIS — D649 Anemia, unspecified: Secondary | ICD-10-CM

## 2022-12-25 LAB — COMPREHENSIVE METABOLIC PANEL
ALT: 53 U/L — ABNORMAL HIGH (ref 0–44)
AST: 36 U/L (ref 15–41)
Albumin: 2.8 g/dL — ABNORMAL LOW (ref 3.5–5.0)
Alkaline Phosphatase: 44 U/L (ref 38–126)
Anion gap: 7 (ref 5–15)
BUN: 6 mg/dL (ref 6–20)
CO2: 23 mmol/L (ref 22–32)
Calcium: 8.4 mg/dL — ABNORMAL LOW (ref 8.9–10.3)
Chloride: 103 mmol/L (ref 98–111)
Creatinine, Ser: 0.6 mg/dL (ref 0.44–1.00)
GFR, Estimated: >60 mL/min (ref >60)
Glucose, Bld: 103 mg/dL — ABNORMAL HIGH (ref 70–99)
Potassium: 3.5 mmol/L (ref 3.5–5.1)
Sodium: 133 mmol/L — ABNORMAL LOW (ref 135–145)
Total Bilirubin: 0.5 mg/dL (ref 0.3–1.2)
Total Protein: 6.2 g/dL — ABNORMAL LOW (ref 6.5–8.1)

## 2022-12-25 LAB — CBC
HCT: 32.5 % — ABNORMAL LOW (ref 36.0–46.0)
Hemoglobin: 10.1 g/dL — ABNORMAL LOW (ref 12.0–15.0)
MCH: 26 pg (ref 26.0–34.0)
MCHC: 31.1 g/dL (ref 30.0–36.0)
MCV: 83.8 fL (ref 80.0–100.0)
Platelets: 191 10*3/uL (ref 150–400)
RBC: 3.88 MIL/uL (ref 3.87–5.11)
RDW: 12.9 % (ref 11.5–15.5)
WBC: 3.1 10*3/uL — ABNORMAL LOW (ref 4.0–10.5)
nRBC: 0 % (ref 0.0–0.2)

## 2022-12-25 LAB — RPR: RPR Ser Ql: NONREACTIVE

## 2022-12-25 LAB — GC/CHLAMYDIA PROBE AMP (~~LOC~~) NOT AT ARMC
Chlamydia: NEGATIVE
Comment: NEGATIVE
Comment: NORMAL
Neisseria Gonorrhea: NEGATIVE

## 2022-12-25 MED ORDER — COLCHICINE 0.6 MG PO TABS
0.6000 mg | ORAL_TABLET | Freq: Every evening | ORAL | Status: DC | PRN
  Administered 2022-12-27: 0.6 mg via ORAL
  Filled 2022-12-25: qty 1

## 2022-12-25 MED ORDER — HYDROXYCHLOROQUINE SULFATE 200 MG PO TABS
200.0000 mg | ORAL_TABLET | Freq: Every day | ORAL | Status: DC
  Administered 2022-12-25 – 2022-12-27 (×3): 200 mg via ORAL
  Filled 2022-12-25 (×3): qty 1

## 2022-12-25 MED ORDER — METOPROLOL TARTRATE 25 MG PO TABS
25.0000 mg | ORAL_TABLET | Freq: Two times a day (BID) | ORAL | Status: DC | PRN
  Filled 2022-12-25: qty 1

## 2022-12-25 MED ORDER — PREDNISONE 5 MG PO TABS
5.0000 mg | ORAL_TABLET | Freq: Once | ORAL | Status: AC
  Administered 2022-12-25: 5 mg via ORAL
  Filled 2022-12-25: qty 1

## 2022-12-25 MED ORDER — PREDNISONE 5 MG PO TABS
10.0000 mg | ORAL_TABLET | Freq: Every day | ORAL | Status: DC
  Administered 2022-12-26 – 2022-12-27 (×2): 10 mg via ORAL
  Filled 2022-12-25 (×2): qty 2

## 2022-12-25 MED ORDER — PROPRANOLOL HCL ER 60 MG PO CP24: 60.0000 mg | ORAL_CAPSULE | Freq: Every day | ORAL | Status: DC | PRN

## 2022-12-25 MED ORDER — TRAMADOL HCL 50 MG PO TABS
50.0000 mg | ORAL_TABLET | Freq: Four times a day (QID) | ORAL | Status: DC | PRN
  Administered 2022-12-25: 50 mg via ORAL
  Filled 2022-12-25: qty 1

## 2022-12-25 MED ORDER — TRAZODONE HCL 50 MG PO TABS
50.0000 mg | ORAL_TABLET | Freq: Every evening | ORAL | Status: DC | PRN
  Administered 2022-12-25 – 2022-12-26 (×2): 50 mg via ORAL
  Filled 2022-12-25 (×2): qty 1

## 2022-12-25 MED ORDER — GABAPENTIN 100 MG PO CAPS
100.0000 mg | ORAL_CAPSULE | Freq: Two times a day (BID) | ORAL | Status: DC | PRN
  Administered 2022-12-26: 100 mg via ORAL
  Filled 2022-12-25: qty 1

## 2022-12-25 MED ORDER — ZINC OXIDE 40 % EX OINT
TOPICAL_OINTMENT | Freq: Three times a day (TID) | CUTANEOUS | Status: DC
  Administered 2022-12-25 – 2022-12-26 (×2): 1 via TOPICAL
  Filled 2022-12-25: qty 57

## 2022-12-25 MED ORDER — CYCLOBENZAPRINE HCL 10 MG PO TABS
10.0000 mg | ORAL_TABLET | Freq: Three times a day (TID) | ORAL | Status: DC | PRN
  Filled 2022-12-25: qty 1

## 2022-12-25 NOTE — Consult Note (Signed)
WOC Nurse Consult Note: Reason for Consult:open lesions on buttocks, back and perineum consistent with varicella. Patient is on systemic acyclovir.  Wound type:viral Pressure Injury POA: N/A Measurement:Per Nursing Flow Sheet Wound [id number], moist, per MD, majority are crusting Drainage (amount, consistency, odor) serous Dressing procedure/placement/frequency: Treatment of the varicella lesions is outside the scope of [place] nursing practice. I have communicated with Dr. Karleen Hampshire this morning and provided comfort-related guidance for the use of zinc oxide to the open lesions to form an occlusive barrier. Crusted lesions may be left open to air. If the zinc oxide is uncomfortable, a silicone foam may be better tolerated. I have provided Nursing with guidance for topical zinc oxide as needed.  [place] nursing team will not follow, but will remain available to this patient, the nursing and medical teams.  Please re-consult if needed.  Thank you for inviting [place] to participate in this patient's Plan of Care.  Maudie Flakes, MSN, RN, CNS, [place], Serita Grammes, [employer], [employer] phone:  [phone]

## 2022-12-25 NOTE — Plan of Care (Signed)
  Problem: Education: Goal: Knowledge of General Education information will improve Description Including pain rating scale, medication(s)/side effects and non-pharmacologic comfort measures Outcome: Progressing   

## 2022-12-25 NOTE — Progress Notes (Signed)
[facility]                                                                               [name], is a 37 y.o. female, DOB - 11-07-1986, YE:[id number] Admit date - 12/24/2022    Outpatient Primary MD for the patient is Leotis Pain, MD  LOS - 1  days    Brief summary    Grace Bishop is a 37 y.o. female with medical history significant of anxiety, SLE, arthralgias, migraine headaches, ovarian cyst, hyperthyroidism, leukopenia, iron deficiency anemia, gallstones, tachycardia who presented to emergency department with a painful vesicular rash that started on her left gluteal area and has spread to her genitalia.  She also has lesions in her mouth, abdomen, back and chest.  The pain is worse on her left sided lower back and genitalia area.   Assessment & Plan    Assessment and Plan:  Disseminated Herpes Zoster:  Continue with IV acyclovir. Pain control and IV fluids.  Continue with ISOLATION.    Hypokalemia;  Replaced.    GAD  Prn ATARAX.   Anemia of chronic disease:     SLE:  Continue with home meds.    Hyponatremia:  Much improved with IV fluids.  Sodium is 133 today.    Elevated liver enzymes:  Improving.   Tachycardia; Prn propranolol.   Estimated body mass index is 21.4 kg/m as calculated from the following:   Height as of this encounter: 5' 2"$  (1.575 m).   Weight as of this encounter: 53.1 kg.  Code Status: full code. DVT Prophylaxis:  enoxaparin (LOVENOX) injection 40 mg Start: 12/24/22 2200   Level of Care: Level of care: Med-Surg Family Communication: none at bedside.   Disposition Plan:     IV acyclovir.   Procedures:  None.   Consultants:   None.   Antimicrobials:   Anti-infectives (From admission, onward)    Start     Dose/Rate Route Frequency Ordered Stop   12/25/22 1015  hydroxychloroquine (PLAQUENIL) tablet 200 mg        200 mg Oral Daily 12/25/22 0917     12/24/22 1800  acyclovir (ZOVIRAX) 530 mg in  dextrose 5 % 100 mL IVPB        10 mg/kg  53.1 kg 110.6 mL/hr over 60 Minutes Intravenous Every 8 hours 12/24/22 1310     12/24/22 0945  acyclovir (ZOVIRAX) 530 mg in dextrose 5 % 100 mL IVPB        10 mg/kg  53.1 kg 110.6 mL/hr over 60 Minutes Intravenous  Once 12/24/22 0931 12/24/22 1119   12/24/22 0934  acyclovir (ZOVIRAX) 50 MG/ML injection       Note to Pharmacy: Salome Arnt L: cabinet override      12/24/22 0934 12/24/22 2144        Medications  Scheduled Meds:  enoxaparin (LOVENOX) injection  40 mg Subcutaneous Q24H   famotidine  10 mg Oral BID   hydroxychloroquine  200 mg Oral Daily   liver oil-zinc oxide   Topical TID   [START ON 12/26/2022] predniSONE  10 mg Oral Q breakfast   Continuous Infusions:  sodium chloride Stopped (12/25/22  1241)   acyclovir 530 mg (12/25/22 0841)   PRN Meds:.acetaminophen **OR** acetaminophen, colchicine, cyclobenzaprine, gabapentin, hydrOXYzine, metoprolol tartrate, ondansetron **OR** ondansetron (ZOFRAN) IV, oxyCODONE, propranolol ER, traMADol, traZODone    Subjective:   Grace Bishop was seen and examined today.  PAIN is better. No chest pain.   Objective:   Vitals:   12/24/22 2208 12/25/22 0200 12/25/22 0624 12/25/22 0945  BP: (!) 147/94 (!) 148/100 128/88 131/88  Pulse: 93 (!) 101 90 87  Resp: 18 17 17 18  $ Temp: 98.9 F (37.2 C) 99 F (37.2 C) 98.3 F (36.8 C) 98.7 F (37.1 C)  TempSrc:    Oral  SpO2: 100% 100% 100% 100%  Weight:      Height:        Intake/Output Summary (Last 24 hours) at 12/25/2022 1553 Last data filed at 12/25/2022 1400 Gross per 24 hour  Intake 2556.64 ml  Output --  Net 2556.64 ml   Filed Weights   12/24/22 0920  Weight: 53.1 kg     Exam General: Alert and oriented x 3, NAD Cardiovascular: S1 S2 auscultated, no murmurs, RRR Respiratory: Clear to auscultation bilaterally, no wheezing, rales or rhonchi Gastrointestinal: Soft, nontender, nondistended, + bowel sounds Ext: no pedal  edema bilaterally Neuro: AAOx3, Cr N's II- XII. Strength 5/5 upper and lower extremities bilaterally Skin: erythematous painful vesicular lesions on the cheeks, arms .  Psych: Normal affect and demeanor, alert and oriented x3    Data Reviewed:  I have personally reviewed following labs and imaging studies   CBC Lab Results  Component Value Date   WBC 3.1 (L) 12/25/2022   RBC 3.88 12/25/2022   HGB 10.1 (L) 12/25/2022   HCT 32.5 (L) 12/25/2022   MCV 83.8 12/25/2022   MCH 26.0 12/25/2022   PLT 191 12/25/2022   MCHC 31.1 12/25/2022   RDW 12.9 12/25/2022   LYMPHSABS 0.2 (L) 12/24/2022   MONOABS 0.6 12/24/2022   EOSABS 0.0 12/24/2022   BASOSABS 0.0 [id number]     Last metabolic panel Lab Results  Component Value Date   NA 133 (L) 12/25/2022   K 3.5 12/25/2022   CL 103 12/25/2022   CO2 23 12/25/2022   BUN 6 12/25/2022   CREATININE 0.60 12/25/2022   GLUCOSE 103 (H) 12/25/2022   GFRNONAA >60 12/25/2022   GFRAA >60 07/13/2020   CALCIUM 8.4 (L) 12/25/2022   PHOS 3.4 12/24/2022   PROT 6.2 (L) 12/25/2022   ALBUMIN 2.8 (L) 12/25/2022   BILITOT 0.5 12/25/2022   ALKPHOS 44 12/25/2022   AST 36 12/25/2022   ALT 53 (H) 12/25/2022   ANIONGAP 7 12/25/2022    CBG (last 3)  No results for input(s): "GLUCAP" in the last 72 hours.    Coagulation Profile: No results for input(s): "INR", "PROTIME" in the last 168 hours.   Radiology Studies: No results found.     Hosie Poisson M.D. [facility] 12/25/2022, 3:53 PM  Available via Epic secure chat 7am-7pm After 7 pm, please refer to night coverage provider listed on amion.

## 2022-12-25 NOTE — TOC CM/SW Note (Signed)
Transition of Care [facility]) Screening Note  Patient Details  Name: Grace Bishop Date of Birth: 1986-04-11  Transition of Care [facility]) CM/SW Contact:    Sherie Don, LCSW Phone Number: 12/25/2022, 10:45 AM  Transition of Care Department [facility]) has reviewed patient and no TOC needs have been identified at this time. We will continue to monitor patient advancement through interdisciplinary progression rounds. If new patient transition needs arise, please place a TOC consult.

## 2022-12-26 DIAGNOSIS — F411 Generalized anxiety disorder: Secondary | ICD-10-CM | POA: Diagnosis not present

## 2022-12-26 DIAGNOSIS — B027 Disseminated zoster: Secondary | ICD-10-CM | POA: Diagnosis not present

## 2022-12-26 DIAGNOSIS — E871 Hypo-osmolality and hyponatremia: Secondary | ICD-10-CM | POA: Diagnosis not present

## 2022-12-26 DIAGNOSIS — E876 Hypokalemia: Secondary | ICD-10-CM | POA: Diagnosis not present

## 2022-12-26 LAB — BASIC METABOLIC PANEL
Anion gap: 7 (ref 5–15)
BUN: 6 mg/dL (ref 6–20)
CO2: 23 mmol/L (ref 22–32)
Calcium: 8.8 mg/dL — ABNORMAL LOW (ref 8.9–10.3)
Chloride: 109 mmol/L (ref 98–111)
Creatinine, Ser: 0.61 mg/dL (ref 0.44–1.00)
GFR, Estimated: >60 mL/min (ref >60)
Glucose, Bld: 101 mg/dL — ABNORMAL HIGH (ref 70–99)
Potassium: 3.2 mmol/L — ABNORMAL LOW (ref 3.5–5.1)
Sodium: 139 mmol/L (ref 135–145)

## 2022-12-26 LAB — CBC WITH DIFFERENTIAL/PLATELET
Abs Immature Granulocytes: 0.02 10*3/uL (ref 0.00–0.07)
Basophils Absolute: 0 10*3/uL (ref 0.0–0.1)
Basophils Relative: 1 %
Eosinophils Absolute: 0 10*3/uL (ref 0.0–0.5)
Eosinophils Relative: 1 %
HCT: 29.7 % — ABNORMAL LOW (ref 36.0–46.0)
Hemoglobin: 9.4 g/dL — ABNORMAL LOW (ref 12.0–15.0)
Immature Granulocytes: 1 %
Lymphocytes Relative: 12 %
Lymphs Abs: 0.3 10*3/uL — ABNORMAL LOW (ref 0.7–4.0)
MCH: 26.6 pg (ref 26.0–34.0)
MCHC: 31.6 g/dL (ref 30.0–36.0)
MCV: 83.9 fL (ref 80.0–100.0)
Monocytes Absolute: 0.7 10*3/uL (ref 0.1–1.0)
Monocytes Relative: 31 %
Neutro Abs: 1.2 10*3/uL — ABNORMAL LOW (ref 1.7–7.7)
Neutrophils Relative %: 54 %
Platelets: 203 10*3/uL (ref 150–400)
RBC: 3.54 MIL/uL — ABNORMAL LOW (ref 3.87–5.11)
RDW: 12.9 % (ref 11.5–15.5)
WBC: 2.1 10*3/uL — ABNORMAL LOW (ref 4.0–10.5)
nRBC: 0 % (ref 0.0–0.2)

## 2022-12-26 MED ORDER — POTASSIUM CHLORIDE CRYS ER 20 MEQ PO TBCR
40.0000 meq | EXTENDED_RELEASE_TABLET | Freq: Once | ORAL | Status: AC
  Administered 2022-12-26: 40 meq via ORAL
  Filled 2022-12-26: qty 2

## 2022-12-26 NOTE — Plan of Care (Signed)
  Problem: Education: Goal: Knowledge of General Education information will improve Description: Including pain rating scale, medication(s)/side effects and non-pharmacologic comfort measures Outcome: Progressing   Problem: Health Behavior/Discharge Planning: Goal: Ability to manage health-related needs will improve Outcome: Progressing   Problem: Clinical Measurements: Goal: Ability to maintain clinical measurements within normal limits will improve Outcome: Progressing   Problem: Activity: Goal: Risk for activity intolerance will decrease Outcome: Progressing   Problem: Nutrition: Goal: Adequate nutrition will be maintained Outcome: Progressing   Problem: Elimination: Goal: Will not experience complications related to bowel motility Outcome: Progressing   Problem: Pain Managment: Goal: General experience of comfort will improve Outcome: Progressing   Problem: Safety: Goal: Ability to remain free from injury will improve Outcome: Progressing   Problem: Skin Integrity: Goal: Risk for impaired skin integrity will decrease Outcome: Progressing   Problem: Education: Goal: Knowledge of General Education information will improve Description: Including pain rating scale, medication(s)/side effects and non-pharmacologic comfort measures Outcome: Progressing   Problem: Health Behavior/Discharge Planning: Goal: Ability to manage health-related needs will improve Outcome: Progressing   Problem: Activity: Goal: Risk for activity intolerance will decrease Outcome: Progressing   Problem: Nutrition: Goal: Adequate nutrition will be maintained Outcome: Progressing   Problem: Coping: Goal: Level of anxiety will decrease Outcome: Progressing   Problem: Elimination: Goal: Will not experience complications related to bowel motility Outcome: Progressing   Problem: Pain Managment: Goal: General experience of comfort will improve Outcome: Progressing

## 2022-12-26 NOTE — Plan of Care (Signed)
  Problem: Education: Goal: Knowledge of General Education information will improve Description Including pain rating scale, medication(s)/side effects and non-pharmacologic comfort measures Outcome: Progressing   

## 2022-12-26 NOTE — Progress Notes (Signed)
[facility]                                                                               [name], is a 37 y.o. female, DOB - 26-Oct-1986, [id number] Admit date - 12/24/2022    Outpatient Primary MD for the patient is Leotis Pain, MD  LOS - 2  days    Brief summary    Grace Bishop is a 37 y.o. female with medical history significant of anxiety, SLE, arthralgias, migraine headaches, ovarian cyst, hyperthyroidism, leukopenia, iron deficiency anemia, gallstones, tachycardia who presented to emergency department with a painful vesicular rash that started on her left gluteal area and has spread to her genitalia.  She also has lesions in her mouth, abdomen, back and chest.  The pain is worse on her left sided lower back and genitalia area.   Assessment & Plan    Assessment and Plan:  Disseminated Herpes Zoster:  Continue with IV acyclovir. Pain control and IV fluids.  Continue with ISOLATION.  Pain is improving. Possible dc in am with oral valcyclovir.    Hypokalemia;  Replaced. Repeat level wnl.    GAD  Prn ATARAX.   Anemia of chronic disease:     SLE:  Continue with home meds.    Hyponatremia:  Much improved with IV fluids.  Sodium is 133 today.    Elevated liver enzymes:  Improving.   Tachycardia; Prn propranolol.   Estimated body mass index is 21.4 kg/m as calculated from the following:   Height as of this encounter: 5' 2"$  (1.575 m).   Weight as of this encounter: 53.1 kg.  Code Status: full code. DVT Prophylaxis:  enoxaparin (LOVENOX) injection 40 mg Start: 12/24/22 2200   Level of Care: Level of care: Med-Surg Family Communication: none at bedside.   Disposition Plan:     IV acyclovir.   Procedures:  None.   Consultants:   None.   Antimicrobials:   Anti-infectives (From admission, onward)    Start     Dose/Rate Route Frequency Ordered Stop   12/25/22 1015  hydroxychloroquine (PLAQUENIL) tablet 200 mg        200  mg Oral Daily 12/25/22 0917     12/24/22 1800  acyclovir (ZOVIRAX) 530 mg in dextrose 5 % 100 mL IVPB        10 mg/kg  53.1 kg 110.6 mL/hr over 60 Minutes Intravenous Every 8 hours 12/24/22 1310     12/24/22 0945  acyclovir (ZOVIRAX) 530 mg in dextrose 5 % 100 mL IVPB        10 mg/kg  53.1 kg 110.6 mL/hr over 60 Minutes Intravenous  Once 12/24/22 0931 12/24/22 1119   12/24/22 0934  acyclovir (ZOVIRAX) 50 MG/ML injection       Note to Pharmacy: Salome Arnt L: cabinet override      12/24/22 0934 12/24/22 2144        Medications  Scheduled Meds:  enoxaparin (LOVENOX) injection  40 mg Subcutaneous Q24H   famotidine  10 mg Oral BID   hydroxychloroquine  200 mg Oral Daily   liver oil-zinc oxide   Topical TID   predniSONE  10 mg Oral  Q breakfast   Continuous Infusions:  sodium chloride 75 mL/hr at 12/26/22 1327   acyclovir 530 mg (12/26/22 0940)   PRN Meds:.acetaminophen **OR** acetaminophen, colchicine, cyclobenzaprine, gabapentin, hydrOXYzine, metoprolol tartrate, ondansetron **OR** ondansetron (ZOFRAN) IV, oxyCODONE, propranolol ER, traMADol, traZODone    Subjective:   Nashelle Lederman was seen and examined today.  Pain is improving. Some crusting of the lesions on the arms.   Objective:   Vitals:   12/25/22 0624 12/25/22 0945 12/25/22 2146 12/26/22 1346  BP: 128/88 131/88 (!) 131/94 (!) 133/90  Pulse: 90 87 89 90  Resp: 17 18 16   $ Temp: 98.3 F (36.8 C) 98.7 F (37.1 C) 98.8 F (37.1 C) 98.1 F (36.7 C)  TempSrc:  Oral  Oral  SpO2: 100% 100% 100% 100%  Weight:      Height:        Intake/Output Summary (Last 24 hours) at 12/26/2022 1616 Last data filed at 12/26/2022 1559 Gross per 24 hour  Intake 2943.45 ml  Output --  Net 2943.45 ml    Filed Weights   12/24/22 0920  Weight: 53.1 kg     Exam General exam: Appears calm and comfortable  Respiratory system: Clear to auscultation. Respiratory effort normal. Cardiovascular system: S1 & S2 heard, RRR.  No JVD, murmurs, rubs, gallops or clicks. No pedal edema. Gastrointestinal system: Abdomen is nondistended, soft and nontender.  Central nervous system: Alert and oriented. No focal neurological deficits. Extremities: Symmetric 5 x 5 power. Skin:crusting lesions on the arms.  Psychiatry:  Mood & affect appropriate.    Data Reviewed:  I have personally reviewed following labs and imaging studies   CBC Lab Results  Component Value Date   WBC 2.1 (L) 12/26/2022   RBC 3.54 (L) 12/26/2022   HGB 9.4 (L) 12/26/2022   HCT 29.7 (L) 12/26/2022   MCV 83.9 12/26/2022   MCH 26.6 12/26/2022   PLT 203 12/26/2022   MCHC 31.6 12/26/2022   RDW 12.9 12/26/2022   LYMPHSABS 0.3 (L) 12/26/2022   MONOABS 0.7 12/26/2022   EOSABS 0.0 12/26/2022   BASOSABS 0.0 [id number]     Last metabolic panel Lab Results  Component Value Date   NA 139 12/26/2022   K 3.2 (L) 12/26/2022   CL 109 12/26/2022   CO2 23 12/26/2022   BUN 6 12/26/2022   CREATININE 0.61 12/26/2022   GLUCOSE 101 (H) 12/26/2022   GFRNONAA >60 12/26/2022   GFRAA >60 07/13/2020   CALCIUM 8.8 (L) 12/26/2022   PHOS 3.4 12/24/2022   PROT 6.2 (L) 12/25/2022   ALBUMIN 2.8 (L) 12/25/2022   BILITOT 0.5 12/25/2022   ALKPHOS 44 12/25/2022   AST 36 12/25/2022   ALT 53 (H) 12/25/2022   ANIONGAP 7 12/26/2022    CBG (last 3)  No results for input(s): "GLUCAP" in the last 72 hours.    Coagulation Profile: No results for input(s): "INR", "PROTIME" in the last 168 hours.   Radiology Studies: No results found.     Hosie Poisson M.D. [facility] 12/26/2022, 4:16 PM  Available via Epic secure chat 7am-7pm After 7 pm, please refer to night coverage provider listed on amion.

## 2022-12-27 DIAGNOSIS — B029 Zoster without complications: Secondary | ICD-10-CM | POA: Diagnosis not present

## 2022-12-27 DIAGNOSIS — E871 Hypo-osmolality and hyponatremia: Secondary | ICD-10-CM | POA: Diagnosis not present

## 2022-12-27 DIAGNOSIS — E876 Hypokalemia: Secondary | ICD-10-CM | POA: Diagnosis not present

## 2022-12-27 DIAGNOSIS — F411 Generalized anxiety disorder: Secondary | ICD-10-CM | POA: Diagnosis not present

## 2022-12-27 LAB — CREATININE, SERUM
Creatinine, Ser: 0.58 mg/dL (ref 0.44–1.00)
GFR, Estimated: >60 mL/min (ref >60)

## 2022-12-27 LAB — POTASSIUM: Potassium: 3.5 mmol/L (ref 3.5–5.1)

## 2022-12-27 MED ORDER — ZINC OXIDE 40 % EX OINT: TOPICAL_OINTMENT | Freq: Three times a day (TID) | CUTANEOUS | 0 refills | Status: AC

## 2022-12-27 MED ORDER — VALACYCLOVIR HCL 1 G PO TABS: 1000.0000 mg | ORAL_TABLET | Freq: Three times a day (TID) | ORAL | 0 refills | Status: AC

## 2022-12-27 NOTE — Plan of Care (Signed)
  Problem: Nutrition: Goal: Adequate nutrition will be maintained Outcome: Progressing   Problem: Coping: Goal: Level of anxiety will decrease Outcome: Progressing   Problem: Pain Managment: Goal: General experience of comfort will improve Outcome: Progressing   Problem: Safety: Goal: Ability to remain free from injury will improve Outcome: Progressing   Problem: Skin Integrity: Goal: Risk for impaired skin integrity will decrease Outcome: Progressing   Problem: Education: Goal: Knowledge of General Education information will improve Description: Including pain rating scale, medication(s)/side effects and non-pharmacologic comfort measures Outcome: Progressing   Problem: Activity: Goal: Risk for activity intolerance will decrease Outcome: Progressing   Problem: Skin Integrity: Goal: Risk for impaired skin integrity will decrease Outcome: Progressing

## 2022-12-27 NOTE — Plan of Care (Signed)
  Problem: Education: Goal: Knowledge of General Education information will improve Description Including pain rating scale, medication(s)/side effects and non-pharmacologic comfort measures Outcome: Progressing   Problem: Health Behavior/Discharge Planning: Goal: Ability to manage health-related needs will improve Outcome: Progressing   

## 2022-12-27 NOTE — Progress Notes (Signed)
Patient was having intermittent chest pain radiating between 2 and 6/10. She thinks this is related to her Lupus. She said she had heart palpitations during the night. She says the Metoprolol med makes her loopy and she doesn't want to take it. Patient willing to take Propanolol when she gets home. EKG obtained. Results: Normal sinus rhythm. Dr. Karleen Hampshire, MD made aware of this info as well as EKG results.  Anda Kraft, RN

## 2022-12-27 NOTE — Progress Notes (Signed)
The patient is alert and oriented and has been seen by her physician. The orders for discharge were written. IV has been removed. Went over discharge instructions with patient. She is being discharged via wheelchair with all of her belongings.   

## 2022-12-27 NOTE — Progress Notes (Signed)
Pharmacy Antibiotic Note  Grace Bishop is a 37 y.o. female admitted on 12/24/2022 with  disseminated herpes zoster .  Pharmacy has been consulted for acyclovir dosing.  Today, 12/27/22 WBC low SCr WNL, stable. IVF ordered: NS @ 75 mL/hr Total body weight = 53 kg, ideal body weight = 50 kg  Currently on day #4 of IV acyclovir.   Plan: Continue acyclovir 10 mg/kg IV q8h Monitor renal function and hydration  Height: 5' 2"$  (157.5 cm) Weight: 53.1 kg (117 lb) IBW/kg (Calculated) : 50.1  Temp (24hrs), Avg:98.3 F (36.8 C), Min:98.1 F (36.7 C), Max:98.6 F (37 C)  Recent Labs  Lab 12/24/22 1015 12/25/22 0311 12/26/22 0352 12/27/22 0333  WBC 3.7* 3.1* 2.1*  --   CREATININE 0.73 0.60 0.61 0.58    Estimated Creatinine Clearance: 76.2 mL/min (by C-G formula based on SCr of 0.58 mg/dL).    Allergies  Allergen Reactions   Dexamethasone Anxiety and Other (See Comments)   Metoclopramide Other (See Comments)    IV only  "teeth clench down"  Liquid only "teeth clench down".  Able to take pill form.   Liquid only "teeth clench down".  Able to take pill form.   "teeth clench down"  "teeth clench down"  Liquid only "teeth clench down".  Able to take pill form.   "teeth clench down"  Liquid only "teeth clench down".  Able to take pill form.   IV only   Fexofenadine Other (See Comments)    Other reaction(s): Tremor (intolerance)   Prochlorperazine Anxiety, Rash and Other (See Comments)    Causes lock jaw  Causes lock jaw  Causes lock jaw    Antimicrobials this admission: acyclovir 2/8 >>   Dose adjustments this admission:  Microbiology results:   Lenis Noon, PharmD 12/27/2022 11:12 AM

## 2022-12-29 NOTE — Discharge Summary (Signed)
Physician Discharge Summary   Patient: Grace Bishop MRN: [id number] DOB: October 27, 1986  Admit date:     12/24/2022  Discharge date: 12/27/2022  Discharge Physician: Hosie Poisson   PCP: Leotis Pain, MD   Recommendations at discharge:  Please follow up with PCP in one week.  Please follow up with ID as needed.  Please follow up with cbc and bmp in one week.   Discharge Diagnoses: Principal Problem:   Herpes zoster Active Problems:   GAD (generalized anxiety disorder)   Leukopenia   Normocytic anemia   Systemic lupus erythematosus, unspecified (HCC)   Hypokalemia   Hyponatremia    Hospital Course: Grace Bishop is a 37 y.o. female with medical history significant of anxiety, SLE, arthralgias, migraine headaches, ovarian cyst, hyperthyroidism, leukopenia, iron deficiency anemia, gallstones, tachycardia who presented to emergency department with a painful vesicular rash that started on her left gluteal area and has spread to her genitalia.  She also has lesions in her mouth, abdomen, back and chest.  The pain is worse on her left sided lower back and genitalia area. She was admitted for disseminated HZ. She was started on IV acyclovir and pain control.   Assessment and Plan:  Disseminated Herpes Zoster:  Lesions are crusting and pain is much better.  She wishes to go home, discussed with Dr Tommy Medal , discharged her home on Valcyclovir for 14 days. Recommended to follow up with PCP in one week.      Hypokalemia;  Replaced. Repeat level wnl.      GAD  Resolved.      Anemia of chronic disease:   rechekc cbc in one week.      SLE:  Continue with home meds.      Hyponatremia:  Much improved with IV fluids.  Sodium stabilized.      Elevated liver enzymes: patient denies any nausea, vomiting or abdominal pain.  Improving.  Recheck liver panel in one week.    Tachycardia; Prn propranolol.           Consultants: CURBS IDE WITH ID Dr Tommy Medal Procedures  performed: none.   Disposition: Home Diet recommendation:  Discharge Diet Orders (From admission, onward)     Start     Ordered   12/27/22 0000  Diet - low sodium heart healthy        12/27/22 1052           Regular diet DISCHARGE MEDICATION: Allergies as of 12/27/2022       Reactions   Dexamethasone Anxiety, Other (See Comments)   Metoclopramide Other (See Comments)   IV only "teeth clench down" Liquid only "teeth clench down".  Able to take pill form.  Liquid only "teeth clench down".  Able to take pill form.   "teeth clench down" "teeth clench down"  Liquid only "teeth clench down".  Able to take pill form.   "teeth clench down"  Liquid only "teeth clench down".  Able to take pill form.   IV only   Fexofenadine Other (See Comments)   Other reaction(s): Tremor (intolerance)   Prochlorperazine Anxiety, Rash, Other (See Comments)   Causes lock jaw Causes lock jaw  Causes lock jaw        Medication List     TAKE these medications    BLINK TEARS OP Place 1 drop into both eyes daily as needed (dry eyes).   colchicine 0.6 MG tablet Take 0.6 mg by mouth at bedtime as needed (pain).   cyclobenzaprine 10  MG tablet Commonly known as: FLEXERIL Take 1 tablet (10 mg total) by mouth 3 (three) times daily as needed for muscle spasms.   famotidine 10 MG tablet Commonly known as: PEPCID Take 10 mg by mouth daily as needed for heartburn or indigestion.   gabapentin 100 MG capsule Commonly known as: NEURONTIN Take 100 mg by mouth 2 (two) times daily as needed (pain).   hydroxychloroquine 200 MG tablet Commonly known as: PLAQUENIL Take 200 mg by mouth daily.   liver oil-zinc oxide 40 % ointment Commonly known as: DESITIN Apply topically 3 (three) times daily.   metoprolol tartrate 25 MG tablet Commonly known as: LOPRESSOR Take 25 mg by mouth 2 (two) times daily as needed (heart palpitations).   ondansetron 8 MG tablet Commonly known as: ZOFRAN Take 8 mg by  mouth every 8 (eight) hours as needed for nausea or vomiting.   predniSONE 5 MG tablet Commonly known as: DELTASONE Take 10 mg by mouth daily with breakfast.   propranolol ER 60 MG 24 hr capsule Commonly known as: INDERAL LA Take 60 mg by mouth daily as needed (headaches).   traMADol 50 MG tablet Commonly known as: ULTRAM Take 50 mg by mouth every 6 (six) hours as needed for moderate pain.   traZODone 50 MG tablet Commonly known as: DESYREL Take 50-100 mg by mouth at bedtime as needed for sleep.   valACYclovir 1000 MG tablet Commonly known as: VALTREX Take 1 tablet (1,000 mg total) by mouth 3 (three) times daily for 10 days.        Follow-up Information     Leotis Pain, MD. Schedule an appointment as soon as possible for a visit in 1 week(s).   Specialty: Internal Medicine Contact information: [address] [place] [address] [phone]                Discharge Exam: Danley Danker Weights   12/24/22 0920  Weight: 53.1 kg   General exam: Appears calm and comfortable  Respiratory system: Clear to auscultation. Respiratory effort normal. Cardiovascular system: S1 & S2 heard, RRR. No JVD,  Gastrointestinal system: Abdomen is nondistended, soft and nontender.  Central nervous system: Alert and oriented. No focal neurological deficits. Extremities: Symmetric 5 x 5 power. Skin: lesions on the face and arms crusted.  Psychiatry: . Mood & affect appropriate.    Condition at discharge: fair  The results of significant diagnostics from this hospitalization (including imaging, microbiology, ancillary and laboratory) are listed below for reference.   Imaging Studies: No results found.  Microbiology: Results for orders placed or performed during the hospital encounter of 01/27/22  Resp Panel by RT-PCR (Flu A&B, Covid) Throat     Status: None   Collection Time: 01/27/22  7:26 AM   Specimen: Throat; Nasopharyngeal(NP) swabs in vial transport medium   Result Value Ref Range Status   SARS Coronavirus 2 by RT PCR NEGATIVE NEGATIVE Final    Comment: (NOTE) SARS-CoV-2 target nucleic acids are NOT DETECTED.  The SARS-CoV-2 RNA is generally detectable in upper respiratory specimens during the acute phase of infection. The lowest concentration of SARS-CoV-2 viral copies this assay can detect is 138 copies/mL. A negative result does not preclude SARS-Cov-2 infection and should not be used as the sole basis for treatment or other patient management decisions. A negative result may occur with  improper specimen collection/handling, submission of specimen other than nasopharyngeal swab, presence of viral mutation(s) within the areas targeted by this assay, and inadequate number of viral  copies(<138 copies/mL). A negative result must be combined with clinical observations, patient history, and epidemiological information. The expected result is Negative.  Fact Sheet for Patients:  EntrepreneurPulse.com.au  Fact Sheet for Healthcare Providers:  IncredibleEmployment.be  This test is no t yet approved or cleared by the [place] FDA and  has been authorized for detection and/or diagnosis of SARS-CoV-2 by FDA under an Emergency Use Authorization (EUA). This EUA will remain  in effect (meaning this test can be used) for the duration of the COVID-19 declaration under Section 564(b)(1) of the Act, 21 U.S.C.section 360bbb-3(b)(1), unless the authorization is terminated  or revoked sooner.       Influenza A by PCR NEGATIVE NEGATIVE Final   Influenza B by PCR NEGATIVE NEGATIVE Final    Comment: (NOTE) The Xpert Xpress SARS-CoV-2/FLU/RSV plus assay is intended as an aid in the diagnosis of influenza from Nasopharyngeal swab specimens and should not be used as a sole basis for treatment. Nasal washings and aspirates are unacceptable for Xpert Xpress SARS-CoV-2/FLU/RSV testing.  Fact Sheet for  Patients: EntrepreneurPulse.com.au  Fact Sheet for Healthcare Providers: IncredibleEmployment.be  This test is not yet approved or cleared by the [place] FDA and has been authorized for detection and/or diagnosis of SARS-CoV-2 by FDA under an Emergency Use Authorization (EUA). This EUA will remain in effect (meaning this test can be used) for the duration of the COVID-19 declaration under Section 564(b)(1) of the Act, 21 U.S.C. section 360bbb-3(b)(1), unless the authorization is terminated or revoked.  Performed at [facility], [address] [place], [place] [address]   Group A Strep by PCR     Status: None   Collection Time: 01/27/22  7:26 AM   Specimen: Throat; Sterile Swab  Result Value Ref Range Status   Group A Strep by PCR NOT DETECTED NOT DETECTED Final    Comment: Performed at [facility], [address] [place], [place] [address]    Labs: CBC: Recent Labs  Lab 12/24/22 1015 12/25/22 0311 12/26/22 0352  WBC 3.7* 3.1* 2.1*  NEUTROABS 2.9  --  1.2*  HGB 11.5* 10.1* 9.4*  HCT 35.9* 32.5* 29.7*  MCV 81.0 83.8 83.9  PLT 197 [id number]   Basic Metabolic Panel: Recent Labs  Lab 12/24/22 1015 12/25/22 0311 12/26/22 0352 12/27/22 0333  NA 129* 133* 139  --   K 3.4* 3.5 3.2* 3.5  CL 104 103 109  --   CO2 21* 23 23  --   GLUCOSE 128* 103* 101*  --   BUN 12 6 6  $ --   CREATININE 0.73 0.60 0.61 0.58  CALCIUM 8.8* 8.4* 8.8*  --   MG 1.7  --   --   --   PHOS 3.4  --   --   --    Liver Function Tests: Recent Labs  Lab 12/24/22 1015 12/25/22 0311  AST 60* 36  ALT 71* 53*  ALKPHOS 55 44  BILITOT 0.4 0.5  PROT 7.1 6.2*  ALBUMIN 3.2* 2.8*   CBG: No results for input(s): "GLUCAP" in the last 168 hours.  Discharge time spent: 36 minutes.   Signed: Hosie Poisson, MD [facility]
